# Patient Record
Sex: Female | Born: 1940 | Race: Black or African American | Hispanic: No | Marital: Married | Smoking: Former smoker
Health system: Southern US, Community
[De-identification: ages and names within clinical notes are randomized; demographics above are authoritative.]

## PROBLEM LIST (undated history)

## (undated) DIAGNOSIS — C801 Malignant (primary) neoplasm, unspecified: Secondary | ICD-10-CM

## (undated) DIAGNOSIS — E119 Type 2 diabetes mellitus without complications: Secondary | ICD-10-CM

## (undated) DIAGNOSIS — M869 Osteomyelitis, unspecified: Secondary | ICD-10-CM

## (undated) DIAGNOSIS — I10 Essential (primary) hypertension: Secondary | ICD-10-CM

## (undated) HISTORY — PX: TUBAL LIGATION: SHX77

## (undated) HISTORY — PX: CYSTECTOMY: SUR359

---

## 1999-01-18 ENCOUNTER — Emergency Department (HOSPITAL_COMMUNITY): Admission: EM | Admit: 1999-01-18 | Discharge: 1999-01-18 | Payer: Self-pay | Admitting: Emergency Medicine

## 2001-09-10 ENCOUNTER — Emergency Department (HOSPITAL_COMMUNITY): Admission: EM | Admit: 2001-09-10 | Discharge: 2001-09-10 | Payer: Self-pay | Admitting: Emergency Medicine

## 2004-03-09 ENCOUNTER — Emergency Department (HOSPITAL_COMMUNITY): Admission: EM | Admit: 2004-03-09 | Discharge: 2004-03-09 | Payer: Self-pay | Admitting: Emergency Medicine

## 2004-08-05 ENCOUNTER — Ambulatory Visit (HOSPITAL_COMMUNITY): Admission: RE | Admit: 2004-08-05 | Discharge: 2004-08-05 | Payer: Self-pay | Admitting: Internal Medicine

## 2004-08-28 ENCOUNTER — Ambulatory Visit (HOSPITAL_COMMUNITY): Admission: RE | Admit: 2004-08-28 | Discharge: 2004-08-28 | Payer: Self-pay | Admitting: Internal Medicine

## 2005-02-19 ENCOUNTER — Ambulatory Visit (HOSPITAL_COMMUNITY): Admission: RE | Admit: 2005-02-19 | Discharge: 2005-02-19 | Payer: Self-pay | Admitting: Internal Medicine

## 2006-03-26 ENCOUNTER — Encounter (INDEPENDENT_AMBULATORY_CARE_PROVIDER_SITE_OTHER): Payer: Self-pay | Admitting: Internal Medicine

## 2006-03-26 ENCOUNTER — Ambulatory Visit: Payer: Self-pay | Admitting: Family Medicine

## 2006-03-30 ENCOUNTER — Encounter (INDEPENDENT_AMBULATORY_CARE_PROVIDER_SITE_OTHER): Payer: Self-pay | Admitting: Family Medicine

## 2006-03-30 ENCOUNTER — Encounter (INDEPENDENT_AMBULATORY_CARE_PROVIDER_SITE_OTHER): Payer: Self-pay | Admitting: Internal Medicine

## 2006-03-30 LAB — CONVERTED CEMR LAB
RBC count: 4.4 10*6/uL
TSH: 1.333 microintl units/mL
WBC, blood: 8.7 10*3/uL

## 2006-04-09 ENCOUNTER — Encounter: Payer: Self-pay | Admitting: Family Medicine

## 2006-04-09 DIAGNOSIS — M810 Age-related osteoporosis without current pathological fracture: Secondary | ICD-10-CM | POA: Insufficient documentation

## 2006-04-09 DIAGNOSIS — N318 Other neuromuscular dysfunction of bladder: Secondary | ICD-10-CM | POA: Insufficient documentation

## 2006-04-09 DIAGNOSIS — K5900 Constipation, unspecified: Secondary | ICD-10-CM | POA: Insufficient documentation

## 2006-04-09 DIAGNOSIS — M1990 Unspecified osteoarthritis, unspecified site: Secondary | ICD-10-CM | POA: Insufficient documentation

## 2006-04-09 DIAGNOSIS — E785 Hyperlipidemia, unspecified: Secondary | ICD-10-CM | POA: Insufficient documentation

## 2006-04-09 DIAGNOSIS — I10 Essential (primary) hypertension: Secondary | ICD-10-CM | POA: Insufficient documentation

## 2006-04-17 ENCOUNTER — Ambulatory Visit: Payer: Self-pay | Admitting: Family Medicine

## 2006-04-17 ENCOUNTER — Encounter (INDEPENDENT_AMBULATORY_CARE_PROVIDER_SITE_OTHER): Payer: Self-pay | Admitting: Internal Medicine

## 2006-04-22 ENCOUNTER — Telehealth (INDEPENDENT_AMBULATORY_CARE_PROVIDER_SITE_OTHER): Payer: Self-pay | Admitting: Internal Medicine

## 2006-04-23 ENCOUNTER — Encounter (INDEPENDENT_AMBULATORY_CARE_PROVIDER_SITE_OTHER): Payer: Self-pay | Admitting: Internal Medicine

## 2006-04-30 ENCOUNTER — Ambulatory Visit: Payer: Self-pay | Admitting: Family Medicine

## 2006-04-30 ENCOUNTER — Encounter (INDEPENDENT_AMBULATORY_CARE_PROVIDER_SITE_OTHER): Payer: Self-pay | Admitting: Internal Medicine

## 2006-05-14 ENCOUNTER — Ambulatory Visit: Payer: Self-pay | Admitting: Internal Medicine

## 2006-06-11 ENCOUNTER — Ambulatory Visit: Payer: Self-pay | Admitting: Family Medicine

## 2006-06-11 ENCOUNTER — Encounter (INDEPENDENT_AMBULATORY_CARE_PROVIDER_SITE_OTHER): Payer: Self-pay | Admitting: Internal Medicine

## 2006-06-11 LAB — CONVERTED CEMR LAB
ALT: 15 units/L (ref 0–35)
AST: 14 units/L (ref 0–37)
Albumin: 4.1 g/dL (ref 3.5–5.2)
Alkaline Phosphatase: 86 units/L (ref 39–117)
Bilirubin, Direct: 0.2 mg/dL (ref 0.0–0.3)
Indirect Bilirubin: 0.2 mg/dL (ref 0.0–0.9)
Total Bilirubin: 0.4 mg/dL (ref 0.3–1.2)
Total Protein: 8 g/dL (ref 6.0–8.3)

## 2006-07-23 ENCOUNTER — Ambulatory Visit: Payer: Self-pay | Admitting: Family Medicine

## 2006-07-23 DIAGNOSIS — D126 Benign neoplasm of colon, unspecified: Secondary | ICD-10-CM

## 2006-07-23 DIAGNOSIS — E1165 Type 2 diabetes mellitus with hyperglycemia: Secondary | ICD-10-CM

## 2006-07-23 LAB — CONVERTED CEMR LAB
Cholesterol, target level: 200 mg/dL
Glucose, Bld: 155 mg/dL
HDL goal, serum: 40 mg/dL
Hgb A1c MFr Bld: 12.2 %
LDL Goal: 100 mg/dL

## 2006-07-24 LAB — CONVERTED CEMR LAB
ALT: 20 units/L (ref 0–35)
AST: 20 units/L (ref 0–37)
Albumin: 4 g/dL (ref 3.5–5.2)
Alkaline Phosphatase: 88 units/L (ref 39–117)
BUN: 17 mg/dL (ref 6–23)
CO2: 17 meq/L — ABNORMAL LOW (ref 19–32)
Calcium: 9.4 mg/dL (ref 8.4–10.5)
Chloride: 104 meq/L (ref 96–112)
Cholesterol: 132 mg/dL (ref 0–200)
Creatinine, Ser: 0.98 mg/dL (ref 0.40–1.20)
Glucose, Bld: 142 mg/dL — ABNORMAL HIGH (ref 70–99)
HDL: 53 mg/dL (ref >39)
LDL Cholesterol: 67 mg/dL (ref 0–99)
Potassium: 4.9 meq/L (ref 3.5–5.3)
Sodium: 139 meq/L (ref 135–145)
Total Bilirubin: 0.4 mg/dL (ref 0.3–1.2)
Total CHOL/HDL Ratio: 2.5
Total Protein: 8 g/dL (ref 6.0–8.3)
Triglycerides: 61 mg/dL (ref <150)
VLDL: 12 mg/dL (ref 0–40)

## 2006-08-06 ENCOUNTER — Encounter (INDEPENDENT_AMBULATORY_CARE_PROVIDER_SITE_OTHER): Payer: Self-pay | Admitting: Internal Medicine

## 2006-08-07 ENCOUNTER — Encounter (INDEPENDENT_AMBULATORY_CARE_PROVIDER_SITE_OTHER): Payer: Self-pay | Admitting: Family Medicine

## 2006-08-12 ENCOUNTER — Encounter (INDEPENDENT_AMBULATORY_CARE_PROVIDER_SITE_OTHER): Payer: Self-pay | Admitting: Internal Medicine

## 2006-08-19 ENCOUNTER — Telehealth (INDEPENDENT_AMBULATORY_CARE_PROVIDER_SITE_OTHER): Payer: Self-pay | Admitting: Family Medicine

## 2006-09-04 ENCOUNTER — Ambulatory Visit: Payer: Self-pay | Admitting: Family Medicine

## 2006-09-04 LAB — CONVERTED CEMR LAB: LDL Goal: 70 mg/dL

## 2006-09-18 ENCOUNTER — Ambulatory Visit: Payer: Self-pay | Admitting: Family Medicine

## 2006-09-18 DIAGNOSIS — J069 Acute upper respiratory infection, unspecified: Secondary | ICD-10-CM | POA: Insufficient documentation

## 2006-09-25 ENCOUNTER — Encounter (INDEPENDENT_AMBULATORY_CARE_PROVIDER_SITE_OTHER): Payer: Self-pay | Admitting: Internal Medicine

## 2006-09-28 ENCOUNTER — Ambulatory Visit (HOSPITAL_COMMUNITY): Admission: RE | Admit: 2006-09-28 | Discharge: 2006-09-28 | Payer: Self-pay | Admitting: Internal Medicine

## 2006-09-28 ENCOUNTER — Ambulatory Visit: Payer: Self-pay | Admitting: Internal Medicine

## 2006-09-28 DIAGNOSIS — R059 Cough, unspecified: Secondary | ICD-10-CM | POA: Insufficient documentation

## 2006-09-28 DIAGNOSIS — R05 Cough: Secondary | ICD-10-CM

## 2006-09-28 LAB — CONVERTED CEMR LAB: Blood Glucose, Fingerstick: 151

## 2006-10-02 ENCOUNTER — Telehealth (INDEPENDENT_AMBULATORY_CARE_PROVIDER_SITE_OTHER): Payer: Self-pay | Admitting: Family Medicine

## 2006-10-29 ENCOUNTER — Ambulatory Visit: Payer: Self-pay | Admitting: Internal Medicine

## 2006-10-29 DIAGNOSIS — R1013 Epigastric pain: Secondary | ICD-10-CM

## 2006-10-29 DIAGNOSIS — K3189 Other diseases of stomach and duodenum: Secondary | ICD-10-CM

## 2006-10-29 LAB — CONVERTED CEMR LAB: Hgb A1c MFr Bld: 11.4 %

## 2006-10-30 ENCOUNTER — Encounter (INDEPENDENT_AMBULATORY_CARE_PROVIDER_SITE_OTHER): Payer: Self-pay | Admitting: Internal Medicine

## 2006-11-20 ENCOUNTER — Encounter (INDEPENDENT_AMBULATORY_CARE_PROVIDER_SITE_OTHER): Payer: Self-pay | Admitting: Internal Medicine

## 2006-12-09 ENCOUNTER — Ambulatory Visit (HOSPITAL_COMMUNITY): Admission: RE | Admit: 2006-12-09 | Discharge: 2006-12-09 | Payer: Self-pay | Admitting: Internal Medicine

## 2007-06-02 ENCOUNTER — Ambulatory Visit (HOSPITAL_COMMUNITY): Admission: RE | Admit: 2007-06-02 | Discharge: 2007-06-02 | Payer: Self-pay | Admitting: *Deleted

## 2007-08-06 ENCOUNTER — Encounter (INDEPENDENT_AMBULATORY_CARE_PROVIDER_SITE_OTHER): Payer: Self-pay | Admitting: Internal Medicine

## 2007-12-20 ENCOUNTER — Encounter (INDEPENDENT_AMBULATORY_CARE_PROVIDER_SITE_OTHER): Payer: Self-pay | Admitting: Internal Medicine

## 2007-12-24 ENCOUNTER — Encounter (INDEPENDENT_AMBULATORY_CARE_PROVIDER_SITE_OTHER): Payer: Self-pay | Admitting: Internal Medicine

## 2009-10-23 ENCOUNTER — Ambulatory Visit (HOSPITAL_COMMUNITY): Admission: RE | Admit: 2009-10-23 | Discharge: 2009-10-23 | Payer: Self-pay | Admitting: Internal Medicine

## 2010-03-31 ENCOUNTER — Emergency Department (HOSPITAL_COMMUNITY)
Admission: EM | Admit: 2010-03-31 | Discharge: 2010-04-01 | Payer: Self-pay | Source: Home / Self Care | Admitting: Emergency Medicine

## 2010-04-01 ENCOUNTER — Ambulatory Visit (HOSPITAL_COMMUNITY): Admission: RE | Admit: 2010-04-01 | Discharge: 2010-04-01 | Payer: Self-pay | Admitting: Emergency Medicine

## 2010-06-09 LAB — CONVERTED CEMR LAB
Microalb Creat Ratio: 39.8 mg/g
Microalb, Ur: 4.04 mg/dL

## 2010-09-27 NOTE — H&P (Signed)
NAMEARABELLA, [name]             ACCOUNT NO.:  [id number]   MEDICAL RECORD NO.:  [id number]          PATIENT TYPE:  INP   LOCATION:  5009                         FACILITY:  [facility]   PHYSICIAN:  Lianne Cure, P.A.  DATE OF BIRTH:  02/14/1929   DATE OF ADMISSION:  01/26/2007  DATE OF DISCHARGE:  01/30/2007                              HISTORY & PHYSICAL   HISTORY:  She is a 70 year old female that comes in with minimal back  pain but left leg radicular pain down the lateral aspect into her foot.   ALLERGIES:  NO KNOWN DRUG ALLERGIES.   MEDICATIONS:  1. Benicar 20/12.5 mg once daily.  2. Baby aspirin 81 mg once daily.   PAST SURGICAL HISTORY:  1. Cholecystectomy.  2. Rotator cuff.  3. Left carpal tunnel.   FAMILY HISTORY:  1. Coronary artery disease.  2. Cancer.  3. Stroke.   REVIEW OF SYSTEMS:  She reports no fever, no chills, no night sweats, no  nausea, vomiting, diarrhea.  No hemoptysis, no bowel or bladder  incontinence.   PHYSICAL EXAMINATION:  VITAL SIGNS:  Temperature 98.7, pulse 70,  respirations 20, blood pressure 170/84.  GENERAL:  She appears well-developed, well-nourished.  HEENT:  Pupils equal, round and reactive to light.  NECK:  Supple.  Full range of motion.  CHEST:  Clear to auscultation bilaterally.  HEART:  Regular rate and rhythm.  No murmur.  ABDOMEN:  Soft.  Nontender to palpation.  EXTREMITIES:  Bilateral upper extremities have full range of motion, 3+  to 4 on quadriceps on the left.  Strength 5/5 on the right.  Sensation  is intact and equal in bilateral lower extremities.  SKIN:  Clean, dry and intact.   RADIOLOGY DATA:  MRI and x-rays were reviewed.   DIAGNOSIS:  Lumbar stenosis/herniated nucleus pulposus L3-4 and L4-5 .   PLAN:  Lumbar laminectomy L3-4 and L4-5 by Dr. Nelda Severe.      Lianne Cure, P.A.     MC/MEDQ  D:  01/14/2007  T:  01/15/2007  Job:  [id number]

## 2010-09-27 NOTE — H&P (Signed)
[name], [name]             ACCOUNT NO.:  [id number]   MEDICAL RECORD NO.:  [id number]          PATIENT TYPE:  OUT   LOCATION:  RAD                           FACILITY:  [facility]   PHYSICIAN:  Lianne Cure, P.A.  DATE OF BIRTH:  02/14/1929   DATE OF ADMISSION:  12/09/2006  DATE OF DISCHARGE:  12/09/2006                              HISTORY & PHYSICAL   HISTORY:  She is a 70 year old female that comes in with minimal back  pain but left leg radicular pain down the lateral aspect into her foot.   ALLERGIES:  NO KNOWN DRUG ALLERGIES.   MEDICATIONS:  1. Benicar 20/12.5 mg once daily.  2. Baby aspirin 81 mg once daily.   PAST SURGICAL HISTORY:  1. Cholecystectomy.  2. Rotator cuff.  3. Left carpal tunnel.   FAMILY HISTORY:  1. Coronary artery disease.  2. Cancer.  3. Stroke.   REVIEW OF SYSTEMS:  She reports no fever, no chills, no night sweats, no  nausea, vomiting, diarrhea.  No hemoptysis, no bowel or bladder  incontinence.   PHYSICAL EXAMINATION:  VITAL SIGNS:  Temperature 98.7, pulse 70,  respirations 20, blood pressure 170/84.  GENERAL:  She appears well-developed, well-nourished.  HEENT:  Pupils equal, round and reactive to light.  NECK:  Supple.  Full range of motion.  CHEST:  Clear to auscultation bilaterally.  HEART:  Regular rate and rhythm.  No murmur.  ABDOMEN:  Soft.  Nontender to palpation.  EXTREMITIES:  Bilateral upper extremities have full range of motion, 3+  to 4 on quadriceps on the left.  Strength 5/5 on the right.  Sensation  is intact and equal in bilateral lower extremities.  SKIN:  Clean, dry and intact.   RADIOLOGY DATA:  MRI and x-rays were reviewed.   DIAGNOSIS:  Lumbar stenosis/herniated nucleus pulposus L3-4 and L4-5 .   PLAN:  Lumbar laminectomy L3-4 and L4-5 by Dr. Nelda Severe.      Lianne Cure, P.A.     MC/MEDQ  D:  01/14/2007  T:  01/15/2007  Job:  [id number]

## 2010-10-12 ENCOUNTER — Emergency Department (HOSPITAL_COMMUNITY)
Admission: EM | Admit: 2010-10-12 | Discharge: 2010-10-12 | Disposition: A | Discharge status: Home / Self Care | Payer: Medicare Other | Attending: Emergency Medicine | Admitting: Emergency Medicine

## 2010-10-12 ENCOUNTER — Emergency Department (HOSPITAL_COMMUNITY): Payer: Medicare Other

## 2010-10-12 DIAGNOSIS — I10 Essential (primary) hypertension: Secondary | ICD-10-CM | POA: Insufficient documentation

## 2010-10-12 DIAGNOSIS — Z79899 Other long term (current) drug therapy: Secondary | ICD-10-CM | POA: Insufficient documentation

## 2010-10-12 DIAGNOSIS — IMO0001 Reserved for inherently not codable concepts without codable children: Secondary | ICD-10-CM | POA: Insufficient documentation

## 2010-10-12 DIAGNOSIS — R05 Cough: Secondary | ICD-10-CM | POA: Insufficient documentation

## 2010-10-12 DIAGNOSIS — R059 Cough, unspecified: Secondary | ICD-10-CM | POA: Insufficient documentation

## 2010-10-12 DIAGNOSIS — E119 Type 2 diabetes mellitus without complications: Secondary | ICD-10-CM | POA: Insufficient documentation

## 2011-07-08 ENCOUNTER — Other Ambulatory Visit (HOSPITAL_COMMUNITY): Payer: Self-pay | Admitting: Internal Medicine

## 2011-07-08 DIAGNOSIS — Z139 Encounter for screening, unspecified: Secondary | ICD-10-CM

## 2011-07-10 ENCOUNTER — Ambulatory Visit (HOSPITAL_COMMUNITY)
Admission: RE | Admit: 2011-07-10 | Discharge: 2011-07-10 | Disposition: A | Discharge status: Home / Self Care | Payer: Medicare Other | Source: Ambulatory Visit | Attending: Internal Medicine | Admitting: Internal Medicine

## 2011-07-10 DIAGNOSIS — Z139 Encounter for screening, unspecified: Secondary | ICD-10-CM

## 2011-07-10 DIAGNOSIS — Z1231 Encounter for screening mammogram for malignant neoplasm of breast: Secondary | ICD-10-CM | POA: Insufficient documentation

## 2012-08-11 ENCOUNTER — Other Ambulatory Visit (HOSPITAL_COMMUNITY): Payer: Self-pay | Admitting: Internal Medicine

## 2012-08-11 DIAGNOSIS — Z139 Encounter for screening, unspecified: Secondary | ICD-10-CM

## 2012-08-13 ENCOUNTER — Ambulatory Visit (HOSPITAL_COMMUNITY)
Admission: RE | Admit: 2012-08-13 | Discharge: 2012-08-13 | Disposition: A | Discharge status: Home / Self Care | Payer: Medicare Other | Source: Ambulatory Visit | Attending: Internal Medicine | Admitting: Internal Medicine

## 2012-08-13 DIAGNOSIS — Z1231 Encounter for screening mammogram for malignant neoplasm of breast: Secondary | ICD-10-CM | POA: Insufficient documentation

## 2012-08-13 DIAGNOSIS — Z139 Encounter for screening, unspecified: Secondary | ICD-10-CM

## 2012-12-09 ENCOUNTER — Emergency Department (HOSPITAL_COMMUNITY): Payer: Medicare Other

## 2012-12-09 ENCOUNTER — Emergency Department (HOSPITAL_COMMUNITY)
Admission: EM | Admit: 2012-12-09 | Discharge: 2012-12-10 | Disposition: A | Discharge status: Home / Self Care | Payer: Medicare Other | Attending: Emergency Medicine | Admitting: Emergency Medicine

## 2012-12-09 ENCOUNTER — Encounter (HOSPITAL_COMMUNITY): Payer: Self-pay | Admitting: Emergency Medicine

## 2012-12-09 DIAGNOSIS — Z87891 Personal history of nicotine dependence: Secondary | ICD-10-CM | POA: Insufficient documentation

## 2012-12-09 DIAGNOSIS — Z9851 Tubal ligation status: Secondary | ICD-10-CM | POA: Insufficient documentation

## 2012-12-09 DIAGNOSIS — I10 Essential (primary) hypertension: Secondary | ICD-10-CM | POA: Insufficient documentation

## 2012-12-09 DIAGNOSIS — R5381 Other malaise: Secondary | ICD-10-CM | POA: Insufficient documentation

## 2012-12-09 DIAGNOSIS — Z79899 Other long term (current) drug therapy: Secondary | ICD-10-CM | POA: Insufficient documentation

## 2012-12-09 DIAGNOSIS — R109 Unspecified abdominal pain: Secondary | ICD-10-CM | POA: Insufficient documentation

## 2012-12-09 DIAGNOSIS — Z880 Allergy status to penicillin: Secondary | ICD-10-CM | POA: Insufficient documentation

## 2012-12-09 DIAGNOSIS — Z7982 Long term (current) use of aspirin: Secondary | ICD-10-CM | POA: Insufficient documentation

## 2012-12-09 DIAGNOSIS — N12 Tubulo-interstitial nephritis, not specified as acute or chronic: Secondary | ICD-10-CM | POA: Insufficient documentation

## 2012-12-09 DIAGNOSIS — E119 Type 2 diabetes mellitus without complications: Secondary | ICD-10-CM | POA: Insufficient documentation

## 2012-12-09 HISTORY — DX: Type 2 diabetes mellitus without complications: E11.9

## 2012-12-09 HISTORY — DX: Essential (primary) hypertension: I10

## 2012-12-09 LAB — CBC WITH DIFFERENTIAL/PLATELET
Eosinophils Absolute: 0.4 10*3/uL (ref 0.0–0.7)
Eosinophils Relative: 3 % (ref 0–5)
Hemoglobin: 10.2 g/dL — ABNORMAL LOW (ref 12.0–15.0)
Lymphs Abs: 4.5 10*3/uL — ABNORMAL HIGH (ref 0.7–4.0)
MCH: 26.6 pg (ref 26.0–34.0)
MCV: 80.7 fL (ref 78.0–100.0)
Monocytes Relative: 10 % (ref 3–12)
RBC: 3.84 MIL/uL — ABNORMAL LOW (ref 3.87–5.11)

## 2012-12-09 LAB — URINALYSIS, ROUTINE W REFLEX MICROSCOPIC
Bilirubin Urine: NEGATIVE
Glucose, UA: >1000 mg/dL — AB
Ketones, ur: NEGATIVE mg/dL
Nitrite: NEGATIVE
Protein, ur: 100 mg/dL — AB
Specific Gravity, Urine: 1.026 (ref 1.005–1.030)
Urobilinogen, UA: 0.2 mg/dL (ref 0.0–1.0)
pH: 7.5 (ref 5.0–8.0)

## 2012-12-09 LAB — BASIC METABOLIC PANEL
BUN: 19 mg/dL (ref 6–23)
Calcium: 9.1 mg/dL (ref 8.4–10.5)
GFR calc non Af Amer: 41 mL/min — ABNORMAL LOW (ref >90)
Glucose, Bld: 230 mg/dL — ABNORMAL HIGH (ref 70–99)
Potassium: 4.3 mEq/L (ref 3.5–5.1)

## 2012-12-09 LAB — URINE MICROSCOPIC-ADD ON

## 2012-12-09 MED ORDER — MORPHINE SULFATE 4 MG/ML IJ SOLN
4.0000 mg | Freq: Once | INTRAMUSCULAR | Status: AC
  Administered 2012-12-09: 4 mg via INTRAVENOUS
  Filled 2012-12-09: qty 1

## 2012-12-09 MED ORDER — LEVOFLOXACIN 500 MG PO TABS
750.0000 mg | ORAL_TABLET | Freq: Once | ORAL | Status: AC
  Administered 2012-12-09: 750 mg via ORAL
  Filled 2012-12-09: qty 2

## 2012-12-09 MED ORDER — LEVOFLOXACIN 750 MG PO TABS: 750.0000 mg | ORAL_TABLET | Freq: Every day | ORAL | Status: DC

## 2012-12-09 MED ORDER — SODIUM CHLORIDE 0.9 % IV BOLUS (SEPSIS)
500.0000 mL | Freq: Once | INTRAVENOUS | Status: AC
  Administered 2012-12-09: 1000 mL via INTRAVENOUS

## 2012-12-09 NOTE — ED Notes (Signed)
Iv team to start iv unsuccessful x2

## 2012-12-09 NOTE — ED Provider Notes (Signed)
CSN: [id number]     Arrival date & time 12/09/12  1942 History     First MD Initiated Contact with Patient 12/09/12 1957     Chief Complaint  Patient presents with  . Flank Pain   (Consider location/radiation/quality/duration/timing/severity/associated sxs/prior Treatment) HPI Comments: 72 yo female with DM, HTN hx presents with recurrent left flank pain, gradually worsening for 5 days.  No hx of similar. Nothing improves. Sharp/ ache.  No AAA hx known.  No fevers.  No dysuria.  No recent surgeries.  Not worse with eating.    The history is provided by the patient.    Past Medical History  Diagnosis Date  . Diabetes mellitus without complication   . Hypertension    Past Surgical History  Procedure Laterality Date  . Tubal ligation     Family History  Problem Relation Age of Onset  . Cancer Mother   . Diabetes Father    History  Substance Use Topics  . Smoking status: Former Games developer  . Smokeless tobacco: Not on file  . Alcohol Use: No   OB History   Grav Para Term Preterm Abortions TAB SAB Ect Mult Living                 Review of Systems  Constitutional: Positive for fatigue. Negative for fever and chills.  HENT: Negative for neck pain and neck stiffness.   Eyes: Negative for visual disturbance.  Respiratory: Negative for shortness of breath.   Cardiovascular: Negative for chest pain.  Gastrointestinal: Positive for abdominal pain. Negative for vomiting.  Genitourinary: Positive for flank pain. Negative for dysuria.  Musculoskeletal: Negative for back pain.  Skin: Negative for rash.  Neurological: Negative for light-headedness and headaches.    Allergies  Metformin; Penicillins; Pioglitazone; and Rosiglitazone maleate  Home Medications   Current Outpatient Rx  Name  Route  Sig  Dispense  Refill  . alendronate (FOSAMAX) 70 MG tablet   Oral   Take 70 mg by mouth every 7 (seven) days. Take with a full glass of water on an empty stomach.         .  benazepril (LOTENSIN) 40 MG tablet   Oral   Take 40 mg by mouth daily.         . cholecalciferol (VITAMIN D) 1000 UNITS tablet   Oral   Take 1,000 Units by mouth 2 (two) times a week.         . diltiazem (DILACOR XR) 180 MG 24 hr capsule   Oral   Take 180 mg by mouth daily.         . fish oil-omega-3 fatty acids 1000 MG capsule   Oral   Take 2 g by mouth daily.         . hydrochlorothiazide (HYDRODIURIL) 25 MG tablet   Oral   Take 25 mg by mouth daily.         . insulin lispro protamine-lispro (HUMALOG 75/25) (75-25) 100 UNIT/ML SUSP   Subcutaneous   Inject 15-70 Units into the skin.         . Multiple Vitamin (MULTIVITAMIN WITH MINERALS) TABS   Oral   Take 1 tablet by mouth daily.         . naproxen sodium (ANAPROX) 220 MG tablet   Oral   Take 220 mg by mouth 2 (two) times daily with a meal.         . rosuvastatin (CRESTOR) 10 MG tablet   Oral   Take  10 mg by mouth daily.         . simvastatin (ZOCOR) 20 MG tablet   Oral   Take 20 mg by mouth every evening.          BP 178/84  Pulse 93  Temp(Src) 99 F (37.2 C) (Oral)  Resp 16  SpO2 100% Physical Exam  Nursing note and vitals reviewed. Constitutional: She is oriented to person, place, and time. She appears well-developed and well-nourished.  HENT:  Head: Normocephalic and atraumatic.  Eyes: Conjunctivae are normal. Right eye exhibits no discharge. Left eye exhibits no discharge.  Neck: Normal range of motion. Neck supple. No tracheal deviation present.  Cardiovascular: Normal rate and regular rhythm.   Pulmonary/Chest: Effort normal and breath sounds normal.  Abdominal: Soft. She exhibits no distension. There is tenderness (left upper). There is no guarding.  Musculoskeletal: She exhibits tenderness (left flank). She exhibits no edema.  Neurological: She is alert and oriented to person, place, and time.  Skin: Skin is warm. No rash noted.  Psychiatric: She has a normal mood and affect.     ED Course   Procedures (including critical care time) EMERGENCY DEPARTMENT ULTRASOUND  Study: Limited Retroperitoneal/ Abdominal Ultrasound of the Abdominal Aorta.  INDICATIONS:Abdominal pain, Back pain and Age>55 Indication: Multiple views of the abdominal aorta are obtained from the diaphragmatic hiatus to the aortic bifurcation in transverse planes with a multi- Frequency probe.  PERFORMED BY: Myself  IMAGES ARCHIVED?: Yes  FINDINGS: Maximum aortic dimensions are 2.2 cm  LIMITATIONS:  Bowel gas  INTERPRETATION:  No abdominal aortic aneurysm  Emergency Focused Ultrasound Exam: Limited abdomen of kidneys and bladder Indication: flank pain Focused abdominal ultrasound with kidneys imaged in transverse and longitudinal planes with bladder visualized in transverse plane. Interpretation: no hydronephrosis visualized.  no stones visualized  Images stored on the machine.    Labs Reviewed  URINALYSIS, ROUTINE W REFLEX MICROSCOPIC - Abnormal; Notable for the following:    APPearance CLOUDY (*)    Glucose, UA >1000 (*)    Hgb urine dipstick SMALL (*)    Protein, ur 100 (*)    Leukocytes, UA SMALL (*)    All other components within normal limits  CBC WITH DIFFERENTIAL - Abnormal; Notable for the following:    WBC 12.2 (*)    RBC 3.84 (*)    Hemoglobin 10.2 (*)    HCT 31.0 (*)    Lymphs Abs 4.5 (*)    Monocytes Absolute 1.2 (*)    All other components within normal limits  BASIC METABOLIC PANEL - Abnormal; Notable for the following:    Glucose, Bld 230 (*)    Creatinine, Ser 1.29 (*)    GFR calc non Af Amer 41 (*)    GFR calc Af Amer 47 (*)    All other components within normal limits  URINE MICROSCOPIC-ADD ON - Abnormal; Notable for the following:    Squamous Epithelial / LPF FEW (*)    Bacteria, UA MANY (*)    All other components within normal limits  URINE CULTURE   No results found. No diagnosis found.  MDM  With age and acute flank pain [place] limited done to  rule out AAA and significant hydronephrosis. Pain improved in ED. Fluids given.  Abx dose given once UA returned.  Pyelonephritis.  Ct Abdomen Pelvis Wo Contrast  12/09/2012   *RADIOLOGY REPORT*  Clinical Data: Left-sided flank pain.  Pain radiating around the back.  Left upper quadrant pain.  CT ABDOMEN  AND PELVIS WITHOUT CONTRAST  Technique:  Multidetector CT imaging of the abdomen and pelvis was performed following the standard protocol without intravenous contrast.  Comparison: None.  Findings: Lung Bases: Clear.  Liver:  Normal. Unenhanced CT was performed per clinician order. Lack of IV contrast limits sensitivity and specificity, especially for evaluation of abdominal/pelvic solid viscera.  Spleen:  Normal.  Gallbladder:  Cholelithiasis is present.  No noncontrast inflammatory changes of the gallbladder on this noncontrast CT.  Common bile duct:  Normal.  Pancreas:  Normal.  Adrenal glands:  Normal bilaterally.  Kidneys:  No stones or hydronephrosis.  The ureters appear within normal limits.  Stomach:  Opacified with oral contrast.  No mural thickening.  No inflammatory changes.  Small bowel:  Normal duodenum.  No small bowel obstruction.  No mesenteric adenopathy or free air.  Colon:   Appendix not definitively identified.  Large proximal stool burden.  Redundant transverse colon.  Sigmoid is decompressed with diverticulosis.  No diverticulitis.  Pelvic Genitourinary:  Urinary bladder appears normal.  Calcified fibroid in the right posterior uterus.  Adnexal structures appear normal.  No free fluid.  No adenopathy.  Bones:  Bilateral hip osteoarthritis.  No aggressive osseous lesions.  Vasculature: Atherosclerosis without acute abnormality.  No aneurysm.  Body Wall: Fat containing periumbilical hernia.  IMPRESSION:  1.  No acute abnormality. 2.  Cholelithiasis. 3.  Atherosclerosis. 4.  Fibroid uterus.   Original Report Authenticated By: Andreas Newport, M.D.   Abx dose in ED.  Close fup outpt.  Well  appearing in ED. Family with patient.   Enid Skeens, MD 12/09/12 [phone]

## 2012-12-09 NOTE — ED Notes (Signed)
IV team bedside. 

## 2012-12-09 NOTE — ED Notes (Signed)
Pt states she had a pain that started in the left side of her back and radiates around to the front and is now hurting in her left upper quadrant  Pt states the pain started one day last week and she has just been putting off coming in   Pt states she is diabetic so she thought she would come in for evaluation   Pt denies N/V/D

## 2012-12-11 LAB — URINE CULTURE

## 2013-01-06 ENCOUNTER — Other Ambulatory Visit (HOSPITAL_COMMUNITY): Payer: Self-pay | Admitting: Internal Medicine

## 2013-01-06 DIAGNOSIS — N63 Unspecified lump in unspecified breast: Secondary | ICD-10-CM

## 2013-01-12 ENCOUNTER — Ambulatory Visit (HOSPITAL_COMMUNITY)
Admission: RE | Admit: 2013-01-12 | Discharge: 2013-01-12 | Disposition: A | Discharge status: Home / Self Care | Payer: Medicare Other | Source: Ambulatory Visit | Attending: Internal Medicine | Admitting: Internal Medicine

## 2013-01-12 DIAGNOSIS — N644 Mastodynia: Secondary | ICD-10-CM | POA: Insufficient documentation

## 2013-01-12 DIAGNOSIS — N63 Unspecified lump in unspecified breast: Secondary | ICD-10-CM | POA: Insufficient documentation

## 2013-03-15 ENCOUNTER — Encounter (HOSPITAL_COMMUNITY): Payer: Self-pay | Admitting: Emergency Medicine

## 2013-03-15 ENCOUNTER — Emergency Department (HOSPITAL_COMMUNITY): Payer: Medicare Other

## 2013-03-15 ENCOUNTER — Emergency Department (HOSPITAL_COMMUNITY)
Admission: EM | Admit: 2013-03-15 | Discharge: 2013-03-15 | Disposition: A | Discharge status: Home / Self Care | Payer: Medicare Other | Attending: Emergency Medicine | Admitting: Emergency Medicine

## 2013-03-15 DIAGNOSIS — R071 Chest pain on breathing: Secondary | ICD-10-CM | POA: Insufficient documentation

## 2013-03-15 DIAGNOSIS — M869 Osteomyelitis, unspecified: Secondary | ICD-10-CM | POA: Insufficient documentation

## 2013-03-15 DIAGNOSIS — I10 Essential (primary) hypertension: Secondary | ICD-10-CM | POA: Insufficient documentation

## 2013-03-15 DIAGNOSIS — E119 Type 2 diabetes mellitus without complications: Secondary | ICD-10-CM | POA: Insufficient documentation

## 2013-03-15 DIAGNOSIS — Z794 Long term (current) use of insulin: Secondary | ICD-10-CM | POA: Insufficient documentation

## 2013-03-15 DIAGNOSIS — Z880 Allergy status to penicillin: Secondary | ICD-10-CM | POA: Insufficient documentation

## 2013-03-15 DIAGNOSIS — Z791 Long term (current) use of non-steroidal anti-inflammatories (NSAID): Secondary | ICD-10-CM | POA: Insufficient documentation

## 2013-03-15 DIAGNOSIS — Z7983 Long term (current) use of bisphosphonates: Secondary | ICD-10-CM | POA: Insufficient documentation

## 2013-03-15 DIAGNOSIS — Z792 Long term (current) use of antibiotics: Secondary | ICD-10-CM | POA: Insufficient documentation

## 2013-03-15 DIAGNOSIS — R1012 Left upper quadrant pain: Secondary | ICD-10-CM | POA: Insufficient documentation

## 2013-03-15 DIAGNOSIS — Z79899 Other long term (current) drug therapy: Secondary | ICD-10-CM | POA: Insufficient documentation

## 2013-03-15 DIAGNOSIS — Z87891 Personal history of nicotine dependence: Secondary | ICD-10-CM | POA: Insufficient documentation

## 2013-03-15 DIAGNOSIS — Z87440 Personal history of urinary (tract) infections: Secondary | ICD-10-CM | POA: Insufficient documentation

## 2013-03-15 DIAGNOSIS — R0789 Other chest pain: Secondary | ICD-10-CM

## 2013-03-15 HISTORY — DX: Osteomyelitis, unspecified: M86.9

## 2013-03-15 MED ORDER — TRAMADOL HCL 50 MG PO TABS
50.0000 mg | ORAL_TABLET | Freq: Once | ORAL | Status: AC
  Administered 2013-03-15: 50 mg via ORAL
  Filled 2013-03-15: qty 1

## 2013-03-15 MED ORDER — TRAMADOL HCL 50 MG PO TABS: 50.0000 mg | ORAL_TABLET | Freq: Four times a day (QID) | ORAL | Status: DC | PRN

## 2013-03-15 NOTE — ED Notes (Signed)
Pt states LUQ pain x 2 months. States she was seen for same pain at [facility] August 31 and was treated for UTI. Pain has continued

## 2013-03-15 NOTE — ED Notes (Signed)
Pt alert & oriented x4, stable gait. Patient given discharge instructions, paperwork & prescription(s). Patient  instructed to stop at the registration desk to finish any additional paperwork. Patient verbalized understanding. Pt left department w/ no further questions. 

## 2013-03-15 NOTE — ED Notes (Signed)
Pt states upper left quadrant pain for the past 2 months, seen by PCP mammogram done (negative per pt). Denies any injury.

## 2013-03-15 NOTE — ED Provider Notes (Signed)
CSN: [id number]     Arrival date & time 03/15/13  1753 History   First MD Initiated Contact with Patient 03/15/13 1811     Chief Complaint  Patient presents with  . Abdominal Pain   (Consider location/radiation/quality/duration/timing/severity/associated sxs/prior Treatment) HPI Comments: Patient presents to the ER for evaluation of pain in the left side of her upper abdomen/lower chest area. She says the symptoms have been there for 2 months. She was seen at [facility] long emergency department on August 31. She says she had tests done and was treated for urinary tract infection, but symptoms haven't improved. Patient reports a sharp pain that is present continuously, worsens with certain positions and movement. She denies falls or injury. There is no fever, nausea, vomiting, diarrhea.  Patient is a 72 y.o. female presenting with abdominal pain.  Abdominal Pain   Past Medical History  Diagnosis Date  . Diabetes mellitus without complication   . Hypertension   . Rheumatoid osteoperiostitis    Past Surgical History  Procedure Laterality Date  . Tubal ligation     Family History  Problem Relation Age of Onset  . Cancer Mother   . Diabetes Father    History  Substance Use Topics  . Smoking status: Former Games developer  . Smokeless tobacco: Not on file  . Alcohol Use: No   OB History   Grav Para Term Preterm Abortions TAB SAB Ect Mult Living                 Review of Systems  Gastrointestinal: Positive for abdominal pain.  All other systems reviewed and are negative.    Allergies  Metformin; Penicillins; Pioglitazone; and Rosiglitazone maleate  Home Medications   Current Outpatient Rx  Name  Route  Sig  Dispense  Refill  . alendronate (FOSAMAX) 70 MG tablet   Oral   Take 70 mg by mouth every 7 (seven) days. Take with a full glass of water on an empty stomach.         . benazepril (LOTENSIN) 40 MG tablet   Oral   Take 40 mg by mouth daily.         . cholecalciferol  (VITAMIN D) 1000 UNITS tablet   Oral   Take 1,000 Units by mouth 2 (two) times a week.         . diltiazem (DILACOR XR) 180 MG 24 hr capsule   Oral   Take 180 mg by mouth daily.         . fish oil-omega-3 fatty acids 1000 MG capsule   Oral   Take 2 g by mouth daily.         . hydrochlorothiazide (HYDRODIURIL) 25 MG tablet   Oral   Take 25 mg by mouth daily.         . insulin lispro protamine-lispro (HUMALOG 75/25) (75-25) 100 UNIT/ML SUSP   Subcutaneous   Inject 15-70 Units into the skin.         Marland Kitchen levofloxacin (LEVAQUIN) 750 MG tablet   Oral   Take 1 tablet (750 mg total) by mouth daily.   4 tablet   0   . Multiple Vitamin (MULTIVITAMIN WITH MINERALS) TABS   Oral   Take 1 tablet by mouth daily.         . naproxen sodium (ANAPROX) 220 MG tablet   Oral   Take 220 mg by mouth 2 (two) times daily with a meal.         . rosuvastatin (  CRESTOR) 10 MG tablet   Oral   Take 10 mg by mouth daily.         . simvastatin (ZOCOR) 20 MG tablet   Oral   Take 20 mg by mouth every evening.          BP 180/74  Pulse 81  Temp(Src) 98.2 F (36.8 C) (Oral)  Resp 16  Ht 5\' 8"  (1.727 m)  Wt 194 lb (87.998 kg)  BMI 29.50 kg/m2  SpO2 99% Physical Exam  Constitutional: She is oriented to person, place, and time. She appears well-developed and well-nourished. No distress.  HENT:  Head: Normocephalic and atraumatic.  Right Ear: Hearing normal.  Left Ear: Hearing normal.  Nose: Nose normal.  Mouth/Throat: Oropharynx is clear and moist and mucous membranes are normal.  Eyes: Conjunctivae and EOM are normal. Pupils are equal, round, and reactive to light.  Neck: Normal range of motion. Neck supple.  Cardiovascular: Regular rhythm, S1 normal and S2 normal.  Exam reveals no gallop and no friction rub.   No murmur heard. Pulmonary/Chest: Effort normal and breath sounds normal. No respiratory distress. She exhibits tenderness.    Abdominal: Soft. Normal appearance and  bowel sounds are normal. There is no hepatosplenomegaly. There is no tenderness. There is no rebound, no guarding, no tenderness at McBurney's point and negative Murphy's sign. No hernia.  Musculoskeletal: Normal range of motion.  Neurological: She is alert and oriented to person, place, and time. She has normal strength. No cranial nerve deficit or sensory deficit. Coordination normal. GCS eye subscore is 4. GCS verbal subscore is 5. GCS motor subscore is 6.  Skin: Skin is warm, dry and intact. No rash noted. No cyanosis.  Psychiatric: She has a normal mood and affect. Her speech is normal and behavior is normal. Thought content normal.    ED Course  Procedures (including critical care time) Labs Review Labs Reviewed - No data to display Imaging Review Dg Ribs Unilateral W/chest Left  03/15/2013   CLINICAL DATA:  Pain  EXAM: LEFT RIBS AND CHEST - 3+ VIEW  COMPARISON:  Chest radiograph October 12, 2010  FINDINGS: Frontal chest as well as bilateral oblique and cone-down lower rib images were obtained. Lungs are clear. Heart size and pulmonary vascularity are normal. No adenopathy.  There is no pneumothorax or effusion. There is no demonstrable rib fracture.  IMPRESSION: The lungs are clear. No demonstrable rib fracture.   Electronically Signed   By: Bretta Bang M.D.   On: 03/15/2013 19:12    EKG Interpretation   None       MDM  Diagnosis: Chest wall pain  Patient presents to the ER for evaluation of pain in the upper abdomen area on the left side. Examination, however, reveals that she points to the lower anterior ribs where her pain is. This area is very tender to the touch. There is no crepitance present. Deep palpation under the ribs in the left upper quadrant and left lower quadrant as well as the rest of the abdomen is nontender and benign. Reviewing her records from previous visit with on refill she had ultrasound and CAT scan performed, no acute abnormalities were seen. This is  likely inflammatory and chest wall in nature. No further testing necessary. There is nothing to indicate acute coronary syndrome, as the patient has had pain for 2 months and is very reproducible with palpation on the ribs. X-ray of the lungs was negative. Rib x-ray likewise showed no acute pathology.  Gilda Crease, MD 03/15/13 [phone]

## 2013-06-08 ENCOUNTER — Ambulatory Visit: Payer: Medicare Other | Admitting: *Deleted

## 2013-12-03 ENCOUNTER — Emergency Department (HOSPITAL_COMMUNITY)
Admission: EM | Admit: 2013-12-03 | Discharge: 2013-12-04 | Disposition: A | Discharge status: Home / Self Care | Payer: Medicare Other | Attending: Emergency Medicine | Admitting: Emergency Medicine

## 2013-12-03 ENCOUNTER — Encounter (HOSPITAL_COMMUNITY): Payer: Self-pay | Admitting: Emergency Medicine

## 2013-12-03 DIAGNOSIS — Z87891 Personal history of nicotine dependence: Secondary | ICD-10-CM | POA: Insufficient documentation

## 2013-12-03 DIAGNOSIS — E119 Type 2 diabetes mellitus without complications: Secondary | ICD-10-CM | POA: Insufficient documentation

## 2013-12-03 DIAGNOSIS — Z794 Long term (current) use of insulin: Secondary | ICD-10-CM | POA: Insufficient documentation

## 2013-12-03 DIAGNOSIS — Z791 Long term (current) use of non-steroidal anti-inflammatories (NSAID): Secondary | ICD-10-CM | POA: Insufficient documentation

## 2013-12-03 DIAGNOSIS — Z7983 Long term (current) use of bisphosphonates: Secondary | ICD-10-CM | POA: Diagnosis not present

## 2013-12-03 DIAGNOSIS — I10 Essential (primary) hypertension: Secondary | ICD-10-CM | POA: Diagnosis not present

## 2013-12-03 DIAGNOSIS — Z880 Allergy status to penicillin: Secondary | ICD-10-CM | POA: Insufficient documentation

## 2013-12-03 DIAGNOSIS — Z79899 Other long term (current) drug therapy: Secondary | ICD-10-CM | POA: Diagnosis not present

## 2013-12-03 DIAGNOSIS — N390 Urinary tract infection, site not specified: Secondary | ICD-10-CM | POA: Insufficient documentation

## 2013-12-03 DIAGNOSIS — Z8739 Personal history of other diseases of the musculoskeletal system and connective tissue: Secondary | ICD-10-CM | POA: Insufficient documentation

## 2013-12-03 DIAGNOSIS — R739 Hyperglycemia, unspecified: Secondary | ICD-10-CM

## 2013-12-03 LAB — URINE MICROSCOPIC-ADD ON

## 2013-12-03 LAB — URINALYSIS, ROUTINE W REFLEX MICROSCOPIC
BILIRUBIN URINE: NEGATIVE
Glucose, UA: >1000 mg/dL — AB
Ketones, ur: NEGATIVE mg/dL
NITRITE: NEGATIVE
Protein, ur: 30 mg/dL — AB
SPECIFIC GRAVITY, URINE: 1.022 (ref 1.005–1.030)
UROBILINOGEN UA: 0.2 mg/dL (ref 0.0–1.0)
pH: 7 (ref 5.0–8.0)

## 2013-12-03 LAB — CBC WITH DIFFERENTIAL/PLATELET
BASOS ABS: 0 10*3/uL (ref 0.0–0.1)
BASOS PCT: 0 % (ref 0–1)
Eosinophils Absolute: 0.3 10*3/uL (ref 0.0–0.7)
Eosinophils Relative: 4 % (ref 0–5)
HCT: 34.4 % — ABNORMAL LOW (ref 36.0–46.0)
HEMOGLOBIN: 11.1 g/dL — AB (ref 12.0–15.0)
LYMPHS PCT: 41 % (ref 12–46)
Lymphs Abs: 3.2 10*3/uL (ref 0.7–4.0)
MCH: 26 pg (ref 26.0–34.0)
MCHC: 32.3 g/dL (ref 30.0–36.0)
MCV: 80.6 fL (ref 78.0–100.0)
Monocytes Absolute: 0.7 10*3/uL (ref 0.1–1.0)
Monocytes Relative: 8 % (ref 3–12)
NEUTROS ABS: 3.6 10*3/uL (ref 1.7–7.7)
NEUTROS PCT: 47 % (ref 43–77)
Platelets: 240 10*3/uL (ref 150–400)
RBC: 4.27 MIL/uL (ref 3.87–5.11)
RDW: 14.4 % (ref 11.5–15.5)
WBC: 7.8 10*3/uL (ref 4.0–10.5)

## 2013-12-03 LAB — BASIC METABOLIC PANEL
ANION GAP: 14 (ref 5–15)
BUN: 21 mg/dL (ref 6–23)
CHLORIDE: 92 meq/L — AB (ref 96–112)
CO2: 27 mEq/L (ref 19–32)
Calcium: 9.9 mg/dL (ref 8.4–10.5)
Creatinine, Ser: 1.32 mg/dL — ABNORMAL HIGH (ref 0.50–1.10)
GFR calc non Af Amer: 39 mL/min — ABNORMAL LOW (ref >90)
GFR, EST AFRICAN AMERICAN: 45 mL/min — AB (ref >90)
Glucose, Bld: 395 mg/dL — ABNORMAL HIGH (ref 70–99)
POTASSIUM: 4 meq/L (ref 3.7–5.3)
Sodium: 133 mEq/L — ABNORMAL LOW (ref 137–147)

## 2013-12-03 LAB — CBG MONITORING, ED: GLUCOSE-CAPILLARY: 384 mg/dL — AB (ref 70–99)

## 2013-12-03 MED ORDER — SODIUM CHLORIDE 0.9 % IV BOLUS (SEPSIS)
1000.0000 mL | Freq: Once | INTRAVENOUS | Status: AC
  Administered 2013-12-03: 1000 mL via INTRAVENOUS

## 2013-12-03 MED ORDER — SODIUM CHLORIDE 0.9 % IV BOLUS (SEPSIS): 500.0000 mL | Freq: Once | INTRAVENOUS | Status: DC

## 2013-12-03 MED ORDER — INSULIN ASPART 100 UNIT/ML ~~LOC~~ SOLN
8.0000 [IU] | Freq: Once | SUBCUTANEOUS | Status: AC
Start: 2013-12-03 — End: 2013-12-03
  Administered 2013-12-03: 8 [IU] via SUBCUTANEOUS
  Filled 2013-12-03: qty 1

## 2013-12-03 NOTE — ED Notes (Signed)
Pt states CBG 855 at home 1600-1700 today, pt states she took fast acting insulin. Denies n/v/d. A & O. Pt states told recently AIC 16. Pt states her meter at home is brand new.

## 2013-12-03 NOTE — ED Notes (Signed)
Blood sugar 270

## 2013-12-03 NOTE — ED Provider Notes (Signed)
CSN: [id number]     Arrival date & time 12/03/13  1908 History   First MD Initiated Contact with Patient 12/03/13 1934     Chief Complaint  Patient presents with  . Hyperglycemia   (Consider location/radiation/quality/duration/timing/severity/associated sxs/prior Treatment) HPI Comments: Patient with history of insulin-dependent diabetes mellitus presents with complaint of hyperglycemia. Patient states that her blood sugars have been poorly controlled over the past 2 months since a change in her insulin regimen. Patient is currently taking Levemir or in the morning and fast acting insulin at lunchtime, dinnertime, and before bed. Patient states that for the past 3 days her blood sugars have been running in the 3-400 range with a peak of 855 at home today. Patient comes to the emergency department due to this elevated blood sugar. Otherwise, she is in good health. She denies any symptoms of illness including URI symptoms, cough, chest pain, and vomiting, diarrhea, urinary symptoms, skin rash. No recent stressors or trauma. The onset of this condition was acute. The course is constant. Aggravating factors: none. Alleviating factors: none.     The history is provided by the patient, the spouse and a relative.    Past Medical History  Diagnosis Date  . Diabetes mellitus without complication   . Hypertension   . Rheumatoid osteoperiostitis    Past Surgical History  Procedure Laterality Date  . Tubal ligation     Family History  Problem Relation Age of Onset  . Cancer Mother   . Diabetes Father    History  Substance Use Topics  . Smoking status: Former Research scientist (life sciences)  . Smokeless tobacco: Not on file  . Alcohol Use: No   OB History   Grav Para Term Preterm Abortions TAB SAB Ect Mult Living                 Review of Systems  Constitutional: Negative for fever.  HENT: Negative for rhinorrhea and sore throat.   Eyes: Negative for redness.  Respiratory: Negative for cough.    Cardiovascular: Negative for chest pain.  Gastrointestinal: Negative for nausea, vomiting, abdominal pain and diarrhea.  Genitourinary: Negative for dysuria, frequency, hematuria, vaginal bleeding and vaginal discharge.  Musculoskeletal: Negative for myalgias.  Skin: Negative for rash.  Neurological: Negative for headaches.   Allergies  Metformin; Penicillins; Pioglitazone; and Rosiglitazone maleate  Home Medications   Prior to Admission medications   Medication Sig Start Date End Date Taking? Authorizing Provider  alendronate (FOSAMAX) 70 MG tablet Take 70 mg by mouth every 7 (seven) days. Take with a full glass of water on an empty stomach.    Historical Provider, MD  benazepril (LOTENSIN) 40 MG tablet Take 40 mg by mouth daily.    Historical Provider, MD  cholecalciferol (VITAMIN D) 1000 UNITS tablet Take 1,000 Units by mouth once a week.    Historical Provider, MD  diltiazem (DILACOR XR) 180 MG 24 hr capsule Take 180 mg by mouth daily.    Historical Provider, MD  hydrochlorothiazide (HYDRODIURIL) 25 MG tablet Take 25 mg by mouth daily.    Historical Provider, MD  insulin glargine (LANTUS) 100 UNIT/ML injection Inject 10 Units into the skin at bedtime.     Historical Provider, MD  Multiple Vitamin (MULTIVITAMIN WITH MINERALS) TABS Take 1 tablet by mouth daily.    Historical Provider, MD  naproxen sodium (ALEVE) 220 MG tablet Take 440 mg by mouth at bedtime as needed (for pain).    Historical Provider, MD  rosuvastatin (CRESTOR) 10 MG tablet Take  10 mg by mouth daily.    Historical Provider, MD  traMADol (ULTRAM) 50 MG tablet Take 1 tablet (50 mg total) by mouth every 6 (six) hours as needed. 03/15/13   Orpah Greek, MD  Vitamin D, Ergocalciferol, (DRISDOL) 50000 UNITS CAPS capsule Take 50,000 Units by mouth 2 (two) times a week.    Historical Provider, MD   BP 157/71  Pulse 86  Temp(Src) 98.9 F (37.2 C) (Oral)  Resp 18  Wt 174 lb (78.926 kg)  SpO2 98%  Physical Exam   Nursing note and vitals reviewed. Constitutional: She appears well-developed and well-nourished.  HENT:  Head: Normocephalic and atraumatic.  Eyes: Conjunctivae are normal. Right eye exhibits no discharge. Left eye exhibits no discharge.  Neck: Normal range of motion. Neck supple.  Cardiovascular: Normal rate, regular rhythm and normal heart sounds.   Pulmonary/Chest: Effort normal and breath sounds normal.  Abdominal: Soft. There is no tenderness.  Neurological: She is alert.  Skin: Skin is warm and dry.  Psychiatric: She has a normal mood and affect.    ED Course  Procedures (including critical care time) Labs Review Labs Reviewed  CBC WITH DIFFERENTIAL - Abnormal; Notable for the following:    Hemoglobin 11.1 (*)    HCT 34.4 (*)    All other components within normal limits  BASIC METABOLIC PANEL - Abnormal; Notable for the following:    Sodium 133 (*)    Chloride 92 (*)    Glucose, Bld 395 (*)    Creatinine, Ser 1.32 (*)    GFR calc non Af Amer 39 (*)    GFR calc Af Amer 45 (*)    All other components within normal limits  URINALYSIS, ROUTINE W REFLEX MICROSCOPIC - Abnormal; Notable for the following:    Glucose, UA >1000 (*)    Hgb urine dipstick SMALL (*)    Protein, ur 30 (*)    Leukocytes, UA MODERATE (*)    All other components within normal limits  URINE MICROSCOPIC-ADD ON - Abnormal; Notable for the following:    Squamous Epithelial / LPF FEW (*)    Bacteria, UA FEW (*)    All other components within normal limits  CBG MONITORING, ED - Abnormal; Notable for the following:    Glucose-Capillary 384 (*)    All other components within normal limits  URINE CULTURE  CBG MONITORING, ED    Imaging Review No results found.   EKG Interpretation None      7:36 PM Patient seen and examined. Work-up initiated. Medications ordered. Pt discussed with Dr. Ashok Cordia who will see.   Vital signs reviewed and are as follows: Filed Vitals:   12/03/13 1921  BP: 157/71   Pulse: 86  Temp: 98.9 F (37.2 C)  Resp: 18   12:13 AM Dr. Ashok Cordia has seen patient earlier. Pt has received SQ insulin and fluids. Blood sugar into upper 200's with this treatment. UA shows possible UTI -- tx with Bactrim, culture sent. Patient and family informed of all results. They're comfortable with discharge to home.  Encouraged the patient to monitor her blood sugars closely and return with sugars greater than 500. Patient encouraged to followup with her physician in 2-3 days for a recheck of her urine and discussion of her insulin management.  Patient urged to return with other concerns. Patient verbalizes understanding and agrees with plan.   MDM   Final diagnoses:  Hyperglycemia  UTI (lower urinary tract infection)   Hyperglycemia: Peak of 384 in  emergency Department improved to 270 with fluids and subcutaneous insulin. No signs of DKA. Patient appears well and is at her baseline. UA suggestive of UTI with 11-20 white blood cells, moderate leukocytes, few bacteria. Given patient is diabetic with hyperglycemia will treat as UTI. Culture is pending. Patient has good primary care physician followup and family support. She seems reliable to return with elevated blood sugars if her symptoms worsen. She is comfortable with discharge home. Plan discussed with patient and family at bedside.  No dangerous or life-threatening conditions suspected or identified by history, physical exam, and by work-up. No indications for hospitalization identified.      Carlisle Cater, PA-C 12/04/13 [phone]

## 2013-12-03 NOTE — ED Notes (Signed)
On assessment patient states she has been taking her medications as ordered, however her BS have been running high for a while. Since she was switched with insulins her levels have been up. Patient denies feeling sick.

## 2013-12-04 MED ORDER — SULFAMETHOXAZOLE-TMP DS 800-160 MG PO TABS
1.0000 | ORAL_TABLET | Freq: Once | ORAL | Status: AC
  Administered 2013-12-04: 1 via ORAL
  Filled 2013-12-04: qty 1

## 2013-12-04 MED ORDER — SULFAMETHOXAZOLE-TRIMETHOPRIM 800-160 MG PO TABS: 1.0000 | ORAL_TABLET | Freq: Two times a day (BID) | ORAL | Status: DC

## 2013-12-04 NOTE — Discharge Instructions (Signed)
Please read and follow all provided instructions.  Your diagnoses today include:  1. Hyperglycemia   2. UTI (lower urinary tract infection)     Tests performed today include:  Urine test - suggests that you have an infection in your bladder  Blood counts and electrolytes - high blood sugar without any acid build-up or other problems  Vital signs. See below for your results today.   Medications prescribed:   Bactrim (trimethoprim/sulfamethoxazole) - antibiotic  You have been prescribed an antibiotic medicine: take the entire course of medicine even if you are feeling better. Stopping early can cause the antibiotic not to work.  Home care instructions:  Follow any educational materials contained in this packet.  Follow-up instructions: Please follow-up with your primary care provider in 3 days for further evaluation of your symptoms.  Return instructions:   Please return to the Emergency Department if you experience worsening symptoms.   Return with fever, worsening pain, persistent vomiting, worsening pain in your back.   Please return if you have any other emergent concerns.  Additional Information:  Your vital signs today were: BP 157/71   Pulse 86   Temp(Src) 98.9 F (37.2 C) (Oral)   Resp 18   Wt 174 lb (78.926 kg)   SpO2 98% If your blood pressure (BP) was elevated above 135/85 this visit, please have this repeated by your doctor within one month. --------------

## 2013-12-05 LAB — CBG MONITORING, ED: Glucose-Capillary: 280 mg/dL — ABNORMAL HIGH (ref 70–99)

## 2013-12-05 NOTE — ED Provider Notes (Signed)
Medical screening examination/treatment/procedure(s) were conducted as a shared visit with non-physician practitioner(s) and myself.  I personally evaluated the patient during the encounter.  Pt with hx iddm, c/o high blood sugars in past few weeks. No nvd. No fever or chills. abd soft nt. Iv ns boluses. Labs.    Mirna Mires, MD 12/05/13 [phone]

## 2013-12-07 LAB — URINE CULTURE

## 2013-12-26 ENCOUNTER — Other Ambulatory Visit (HOSPITAL_COMMUNITY): Payer: Self-pay | Admitting: Internal Medicine

## 2013-12-26 DIAGNOSIS — Z1231 Encounter for screening mammogram for malignant neoplasm of breast: Secondary | ICD-10-CM

## 2014-01-17 ENCOUNTER — Ambulatory Visit (HOSPITAL_COMMUNITY)
Admission: RE | Admit: 2014-01-17 | Discharge: 2014-01-17 | Disposition: A | Discharge status: Home / Self Care | Payer: Medicare Other | Source: Ambulatory Visit | Attending: Internal Medicine | Admitting: Internal Medicine

## 2014-01-17 DIAGNOSIS — Z1231 Encounter for screening mammogram for malignant neoplasm of breast: Secondary | ICD-10-CM | POA: Insufficient documentation

## 2014-04-25 ENCOUNTER — Encounter: Payer: Self-pay | Admitting: Surgery

## 2014-04-25 ENCOUNTER — Other Ambulatory Visit: Payer: Self-pay | Admitting: *Deleted

## 2014-04-25 DIAGNOSIS — I739 Peripheral vascular disease, unspecified: Secondary | ICD-10-CM

## 2014-05-19 ENCOUNTER — Encounter: Payer: Self-pay | Admitting: Surgery

## 2014-05-22 ENCOUNTER — Encounter: Payer: Medicare Other | Admitting: Surgery

## 2014-05-22 ENCOUNTER — Encounter (HOSPITAL_COMMUNITY): Payer: Medicare Other

## 2014-07-01 ENCOUNTER — Encounter (HOSPITAL_COMMUNITY): Payer: Self-pay | Admitting: Emergency Medicine

## 2014-07-01 ENCOUNTER — Emergency Department (HOSPITAL_COMMUNITY)
Admission: EM | Admit: 2014-07-01 | Discharge: 2014-07-01 | Disposition: A | Discharge status: Home / Self Care | Payer: Medicare Other | Attending: Emergency Medicine | Admitting: Emergency Medicine

## 2014-07-01 DIAGNOSIS — Z792 Long term (current) use of antibiotics: Secondary | ICD-10-CM | POA: Diagnosis not present

## 2014-07-01 DIAGNOSIS — Z87891 Personal history of nicotine dependence: Secondary | ICD-10-CM | POA: Diagnosis not present

## 2014-07-01 DIAGNOSIS — E11621 Type 2 diabetes mellitus with foot ulcer: Secondary | ICD-10-CM | POA: Diagnosis not present

## 2014-07-01 DIAGNOSIS — Z794 Long term (current) use of insulin: Secondary | ICD-10-CM | POA: Diagnosis not present

## 2014-07-01 DIAGNOSIS — L97529 Non-pressure chronic ulcer of other part of left foot with unspecified severity: Secondary | ICD-10-CM | POA: Diagnosis not present

## 2014-07-01 DIAGNOSIS — M069 Rheumatoid arthritis, unspecified: Secondary | ICD-10-CM | POA: Diagnosis not present

## 2014-07-01 DIAGNOSIS — Z79899 Other long term (current) drug therapy: Secondary | ICD-10-CM | POA: Insufficient documentation

## 2014-07-01 DIAGNOSIS — I10 Essential (primary) hypertension: Secondary | ICD-10-CM | POA: Diagnosis not present

## 2014-07-01 DIAGNOSIS — L988 Other specified disorders of the skin and subcutaneous tissue: Secondary | ICD-10-CM | POA: Diagnosis present

## 2014-07-01 DIAGNOSIS — Z880 Allergy status to penicillin: Secondary | ICD-10-CM | POA: Insufficient documentation

## 2014-07-01 DIAGNOSIS — L97509 Non-pressure chronic ulcer of other part of unspecified foot with unspecified severity: Secondary | ICD-10-CM

## 2014-07-01 MED ORDER — CEPHALEXIN 500 MG PO CAPS: 500.0000 mg | ORAL_CAPSULE | Freq: Four times a day (QID) | ORAL | Status: DC

## 2014-07-01 NOTE — ED Provider Notes (Signed)
Medical screening examination/treatment/procedure(s) were performed by non-physician practitioner and as supervising physician I was immediately available for consultation/collaboration.   EKG Interpretation None      Briefly, pt is a 74 y.o. female presenting with L great toe blister.  No systemic symptoms.  I performed an examination on the patient including cardiac, pulmonary, and gi systems which were unremarkable.  L toe NV intact, no signs of erythema or infection, likely healing ulcer.  Strict return precautions given.  DC home in stable condition.     Debby Freiberg, MD 07/01/14 [phone]

## 2014-07-01 NOTE — Discharge Instructions (Signed)
Diabetes and Foot Care Diabetes may cause you to have problems because of poor blood supply (circulation) to your feet and legs. This may cause the skin on your feet to become thinner, break easier, and heal more slowly. Your skin may become dry, and the skin may peel and crack. You may also have nerve damage in your legs and feet causing decreased feeling in them. You may not notice minor injuries to your feet that could lead to infections or more serious problems. Taking care of your feet is one of the most important things you can do for yourself.  HOME CARE INSTRUCTIONS  Wear shoes at all times, even in the house. Do not go barefoot. Bare feet are easily injured.  Check your feet daily for blisters, cuts, and redness. If you cannot see the bottom of your feet, use a mirror or ask someone for help.  Wash your feet with warm water (do not use hot water) and mild soap. Then pat your feet and the areas between your toes until they are completely dry. Do not soak your feet as this can dry your skin.  Apply a moisturizing lotion or petroleum jelly (that does not contain alcohol and is unscented) to the skin on your feet and to dry, brittle toenails. Do not apply lotion between your toes.  Trim your toenails straight across. Do not dig under them or around the cuticle. File the edges of your nails with an emery board or nail file.  Do not cut corns or calluses or try to remove them with medicine.  Wear clean socks or stockings every day. Make sure they are not too tight. Do not wear knee-high stockings since they may decrease blood flow to your legs.  Wear shoes that fit properly and have enough cushioning. To break in new shoes, wear them for just a few hours a day. This prevents you from injuring your feet. Always look in your shoes before you put them on to be sure there are no objects inside.  Do not cross your legs. This may decrease the blood flow to your feet.  If you find a minor scrape,  cut, or break in the skin on your feet, keep it and the skin around it clean and dry. These areas may be cleansed with mild soap and water. Do not cleanse the area with peroxide, alcohol, or iodine.  When you remove an adhesive bandage, be sure not to damage the skin around it.  If you have a wound, look at it several times a day to make sure it is healing.  Do not use heating pads or hot water bottles. They may burn your skin. If you have lost feeling in your feet or legs, you may not know it is happening until it is too late.  Make sure your health care provider performs a complete foot exam at least annually or more often if you have foot problems. Report any cuts, sores, or bruises to your health care provider immediately. SEEK MEDICAL CARE IF:   You have an injury that is not healing.  You have cuts or breaks in the skin.  You have an ingrown nail.  You notice redness on your legs or feet.  You feel burning or tingling in your legs or feet.  You have pain or cramps in your legs and feet.  Your legs or feet are numb.  Your feet always feel cold. SEEK IMMEDIATE MEDICAL CARE IF:   There is increasing redness,  swelling, or pain in or around a wound.  There is a red line that goes up your leg.  Pus is coming from a wound.  You develop a fever or as directed by your health care provider.  You notice a bad smell coming from an ulcer or wound. Document Released: 04/25/2000 Document Revised: 12/29/2012 Document Reviewed: 10/05/2012 [facility] Patient Information 2015 [place], [place]. This information is not intended to replace advice given to you by your health care provider. Make sure you discuss any questions you have with your health care provider.  Skin Ulcer A skin ulcer is an open sore that can be shallow or deep. Skin ulcers sometimes become infected and are difficult to treat. It may be 1 month or longer before real healing progress is made. CAUSES   Injury.  Problems  with the veins or arteries.  Diabetes.  Insect bites.  Bedsores.  Inflammatory conditions. SYMPTOMS   Pain, redness, swelling, and tenderness around the ulcer.  Fever.  Bleeding from the ulcer.  Yellow or clear fluid coming from the ulcer. DIAGNOSIS  There are many types of skin ulcers. Any open sores will be examined. Certain tests will be done to determine the kind of ulcer you have. The right treatment depends on the type of ulcer you have. TREATMENT  Treatment is a long-term challenge. It may include:  Wearing an elastic wrap, compression stockings, or gel cast over the ulcer area.  Taking antibiotic medicines or putting antibiotic creams on the affected area if there is an infection. HOME CARE INSTRUCTIONS  Put on your bandages (dressings), wraps, or casts over the ulcer as directed by your caregiver.  Change all dressings as directed by your caregiver.  Take all medicines as directed by your caregiver.  Keep the affected area clean and dry.  Avoid injuries to the affected area.  Eat a well-balanced, healthy diet that includes plenty of fruit and vegetables.  If you smoke, consider quitting or decreasing the amount of cigarettes you smoke.  Once the ulcer heals, get regular exercise as directed by your caregiver.  Work with your caregiver to make sure your blood pressure, cholesterol, and diabetes are well-controlled.  Keep your skin moisturized. Dry skin can crack and lead to skin ulcers. SEEK IMMEDIATE MEDICAL CARE IF:   Your pain gets worse.  You have swelling, redness, or fluids around the ulcer.  You have chills.  You have a fever. MAKE SURE YOU:   Understand these instructions.  Will watch your condition.  Will get help right away if you are not doing well or get worse. Document Released: 06/05/2004 Document Revised: 07/21/2011 Document Reviewed: 12/13/2010 [facility] Patient Information 2015 [place], [place]. This information is not intended to  replace advice given to you by your health care provider. Make sure you discuss any questions you have with your health care provider.

## 2014-07-01 NOTE — ED Provider Notes (Signed)
CSN: [id number]     Arrival date & time 07/01/14  1531 History   First MD Initiated Contact with Patient 07/01/14 1600     This chart was scribed for non-physician practitioner, Margarita Mail, PA-C, working with No att. providers found by Forrestine Him, ED Scribe. This patient was seen in room [place] and the patient's care was started at 4:25 PM.   Chief Complaint  Patient presents with  . toe blister    HPI  HPI Comments: [name] is a 74 y.o. female with a PMHx of DM, HTN, rheumatoid osteoperiostitis who presents to the Emergency Department complaining of a toe blister to L great toe with a mild associated foul odor x 5 days. No redness or drainage noted from area. No new injury or trauma to foot. Pt states calluses to feet are baseline. However, new blister recently "popped up". [name] has applied Peroxide, Betadine, and Neosporin to area with daily dressings BID. No recent fever or chills. Pt states blood sugars fluctuate from 48 to 550's. Last A1C 13. Pt with known allergies to Metformin, Penicillins, Pioglitazone, and Rosiglitazone Maleate.  She is followed by: Maximino Greenland, MD-PCP  Past Medical History  Diagnosis Date  . Diabetes mellitus without complication   . Hypertension   . Rheumatoid osteoperiostitis    Past Surgical History  Procedure Laterality Date  . Tubal ligation     Family History  Problem Relation Age of Onset  . Cancer Mother   . Diabetes Father    History  Substance Use Topics  . Smoking status: Former Research scientist (life sciences)  . Smokeless tobacco: Not on file  . Alcohol Use: No   OB History    No data available     Review of Systems  Constitutional: Negative for fever and chills.  HENT: Negative for trouble swallowing.   Respiratory: Negative for shortness of breath.   Cardiovascular: Negative for chest pain.  Gastrointestinal: Negative for nausea, vomiting, abdominal pain, diarrhea and constipation.  Genitourinary: Negative for dysuria and  hematuria.  Musculoskeletal: Negative for myalgias and arthralgias.  Skin: Positive for wound. Negative for rash.  Neurological: Negative for numbness.  All other systems reviewed and are negative.     Allergies  Rosiglitazone maleate; Metformin; Penicillins; and Pioglitazone  Home Medications   Prior to Admission medications   Medication Sig Start Date End Date Taking? Authorizing Provider  alendronate (FOSAMAX) 70 MG tablet Take 70 mg by mouth every 7 (seven) days. Take with a full glass of water on an empty stomach.   Yes Historical Provider, MD  benazepril (LOTENSIN) 40 MG tablet Take 40 mg by mouth daily.   Yes Historical Provider, MD  cholecalciferol (VITAMIN D) 1000 UNITS tablet Take 1,000 Units by mouth daily.    Yes Historical Provider, MD  diltiazem (DILACOR XR) 180 MG 24 hr capsule Take 180 mg by mouth daily.   Yes Historical Provider, MD  glimepiride (AMARYL) 2 MG tablet Take 2 mg by mouth 2 (two) times daily.   Yes Historical Provider, MD  insulin lispro protamine-lispro (HUMALOG 75/25 MIX) (75-25) 100 UNIT/ML SUSP injection Inject 25-32 Units into the skin 2 (two) times daily with a meal. Takes 32 units in the morning and 25 units at night   Yes Historical Provider, MD  naphazoline-glycerin (CLEAR EYES) 0.012-0.2 % SOLN Place 1-2 drops into both eyes every 4 (four) hours as needed for irritation.   Yes Historical Provider, MD  naproxen sodium (ALEVE) 220 MG tablet Take 440 mg by  mouth at bedtime as needed (for pain).   Yes Historical Provider, MD  Omega-3 Fatty Acids (FISH OIL PO) Take 1 capsule by mouth daily.   Yes Historical Provider, MD  simvastatin (ZOCOR) 20 MG tablet Take 20 mg by mouth daily.   Yes Historical Provider, MD  cephALEXin (KEFLEX) 500 MG capsule Take 1 capsule (500 mg total) by mouth 4 (four) times daily. 07/01/14   Margarita Mail, PA-C  sulfamethoxazole-trimethoprim (SEPTRA DS) 800-160 MG per tablet Take 1 tablet by mouth every 12 (twelve) hours. Patient  not taking: Reported on 07/01/2014 12/04/13   Carlisle Cater, PA-C   Triage Vitals: BP 188/70 mmHg  Pulse 81  Temp(Src) 98.5 F (36.9 C) (Oral)  Resp 16  SpO2 100%   Physical Exam  Constitutional: She is oriented to person, place, and time. She appears well-developed and well-nourished.  HENT:  Head: Normocephalic.  Eyes: EOM are normal.  Neck: Normal range of motion.  Pulmonary/Chest: Effort normal.  Abdominal: She exhibits no distension.  Musculoskeletal: Normal range of motion.  Neurological: She is alert and oriented to person, place, and time.  Psychiatric: She has a normal mood and affect.  Nursing note and vitals reviewed.   ED Course  Procedures (including critical care time)  DIAGNOSTIC STUDIES: Oxygen Saturation is 100% on RA, Normal by my interpretation.    COORDINATION OF CARE: 4:32 PM-Discussed treatment plan with pt at bedside and pt agreed to plan.     Labs Review Labs Reviewed - No data to display  Imaging Review No results found.    EKG Interpretation None      MDM   Final diagnoses:  Diabetic toe ulcer    Patient with ulceration of the foot. No signs of deep infection. Discharge with Keflex.  Supportive care. F/u with pcp. I personally performed the services described in this documentation, which was scribed in my presence. The recorded information has been reviewed and is accurate.       Margarita Mail, PA-C 07/12/14 1328  Debby Freiberg, MD 07/14/14 [phone]

## 2014-07-01 NOTE — ED Notes (Addendum)
Pt from home c/o blister that "popped up" 5 days ago. Pt sts that she has been using peroxide, betadine and neosporin with daily dressing. Pt has small open blister to L great toe. No redness or oozing noted. Pt denies injury but that she is a diabetic. Pt denies fevers or other c/o. Pt is A&O and in NAD

## 2014-10-06 ENCOUNTER — Other Ambulatory Visit: Payer: Self-pay | Admitting: Internal Medicine

## 2014-10-06 DIAGNOSIS — R269 Unspecified abnormalities of gait and mobility: Secondary | ICD-10-CM

## 2014-10-14 ENCOUNTER — Ambulatory Visit
Admission: RE | Admit: 2014-10-14 | Discharge: 2014-10-14 | Disposition: A | Discharge status: Home / Self Care | Payer: Medicare Other | Source: Ambulatory Visit | Attending: Internal Medicine | Admitting: Internal Medicine

## 2014-10-14 DIAGNOSIS — R269 Unspecified abnormalities of gait and mobility: Secondary | ICD-10-CM

## 2015-02-01 ENCOUNTER — Ambulatory Visit
Admission: RE | Admit: 2015-02-01 | Discharge: 2015-02-01 | Disposition: A | Discharge status: Home / Self Care | Payer: Medicare Other | Source: Ambulatory Visit | Attending: Internal Medicine | Admitting: Internal Medicine

## 2015-02-01 ENCOUNTER — Other Ambulatory Visit: Payer: Self-pay | Admitting: Internal Medicine

## 2015-02-01 DIAGNOSIS — M25561 Pain in right knee: Secondary | ICD-10-CM

## 2015-07-17 DIAGNOSIS — M129 Arthropathy, unspecified: Secondary | ICD-10-CM | POA: Diagnosis not present

## 2015-08-01 DIAGNOSIS — N183 Chronic kidney disease, stage 3 (moderate): Secondary | ICD-10-CM | POA: Diagnosis not present

## 2015-08-01 DIAGNOSIS — E1122 Type 2 diabetes mellitus with diabetic chronic kidney disease: Secondary | ICD-10-CM | POA: Diagnosis not present

## 2015-08-01 DIAGNOSIS — N08 Glomerular disorders in diseases classified elsewhere: Secondary | ICD-10-CM | POA: Diagnosis not present

## 2015-08-01 DIAGNOSIS — I129 Hypertensive chronic kidney disease with stage 1 through stage 4 chronic kidney disease, or unspecified chronic kidney disease: Secondary | ICD-10-CM | POA: Diagnosis not present

## 2015-08-09 DIAGNOSIS — M810 Age-related osteoporosis without current pathological fracture: Secondary | ICD-10-CM | POA: Diagnosis not present

## 2015-08-09 DIAGNOSIS — E559 Vitamin D deficiency, unspecified: Secondary | ICD-10-CM | POA: Diagnosis not present

## 2015-08-09 DIAGNOSIS — E11319 Type 2 diabetes mellitus with unspecified diabetic retinopathy without macular edema: Secondary | ICD-10-CM | POA: Diagnosis not present

## 2015-08-09 DIAGNOSIS — E1165 Type 2 diabetes mellitus with hyperglycemia: Secondary | ICD-10-CM | POA: Diagnosis not present

## 2015-08-09 DIAGNOSIS — M109 Gout, unspecified: Secondary | ICD-10-CM | POA: Diagnosis not present

## 2015-08-09 DIAGNOSIS — I10 Essential (primary) hypertension: Secondary | ICD-10-CM | POA: Diagnosis not present

## 2015-08-09 DIAGNOSIS — Z79899 Other long term (current) drug therapy: Secondary | ICD-10-CM | POA: Diagnosis not present

## 2015-08-09 DIAGNOSIS — E11621 Type 2 diabetes mellitus with foot ulcer: Secondary | ICD-10-CM | POA: Diagnosis not present

## 2015-08-17 ENCOUNTER — Emergency Department (HOSPITAL_COMMUNITY): Payer: Medicare Other

## 2015-08-17 ENCOUNTER — Encounter (HOSPITAL_COMMUNITY): Payer: Self-pay | Admitting: Emergency Medicine

## 2015-08-17 ENCOUNTER — Emergency Department (HOSPITAL_COMMUNITY)
Admission: EM | Admit: 2015-08-17 | Discharge: 2015-08-17 | Disposition: A | Discharge status: Home / Self Care | Payer: Medicare Other | Attending: Emergency Medicine | Admitting: Emergency Medicine

## 2015-08-17 DIAGNOSIS — E860 Dehydration: Secondary | ICD-10-CM | POA: Diagnosis not present

## 2015-08-17 DIAGNOSIS — Z79899 Other long term (current) drug therapy: Secondary | ICD-10-CM | POA: Insufficient documentation

## 2015-08-17 DIAGNOSIS — Z880 Allergy status to penicillin: Secondary | ICD-10-CM | POA: Insufficient documentation

## 2015-08-17 DIAGNOSIS — E1165 Type 2 diabetes mellitus with hyperglycemia: Secondary | ICD-10-CM | POA: Diagnosis not present

## 2015-08-17 DIAGNOSIS — Z8739 Personal history of other diseases of the musculoskeletal system and connective tissue: Secondary | ICD-10-CM | POA: Insufficient documentation

## 2015-08-17 DIAGNOSIS — Z7984 Long term (current) use of oral hypoglycemic drugs: Secondary | ICD-10-CM | POA: Diagnosis not present

## 2015-08-17 DIAGNOSIS — K5900 Constipation, unspecified: Secondary | ICD-10-CM | POA: Diagnosis not present

## 2015-08-17 DIAGNOSIS — Z87891 Personal history of nicotine dependence: Secondary | ICD-10-CM | POA: Insufficient documentation

## 2015-08-17 DIAGNOSIS — R739 Hyperglycemia, unspecified: Secondary | ICD-10-CM

## 2015-08-17 DIAGNOSIS — Z792 Long term (current) use of antibiotics: Secondary | ICD-10-CM | POA: Diagnosis not present

## 2015-08-17 DIAGNOSIS — I10 Essential (primary) hypertension: Secondary | ICD-10-CM | POA: Diagnosis not present

## 2015-08-17 DIAGNOSIS — Z794 Long term (current) use of insulin: Secondary | ICD-10-CM | POA: Insufficient documentation

## 2015-08-17 DIAGNOSIS — N390 Urinary tract infection, site not specified: Secondary | ICD-10-CM | POA: Diagnosis not present

## 2015-08-17 LAB — CBC WITH DIFFERENTIAL/PLATELET
Basophils Absolute: 0 10*3/uL (ref 0.0–0.1)
Basophils Relative: 0 %
Eosinophils Absolute: 0.2 10*3/uL (ref 0.0–0.7)
Eosinophils Relative: 2 %
HCT: 39.5 % (ref 36.0–46.0)
Hemoglobin: 12.7 g/dL (ref 12.0–15.0)
Lymphocytes Relative: 43 %
Lymphs Abs: 4 10*3/uL (ref 0.7–4.0)
MCH: 26.6 pg (ref 26.0–34.0)
MCHC: 32.2 g/dL (ref 30.0–36.0)
MCV: 82.6 fL (ref 78.0–100.0)
Monocytes Absolute: 0.7 10*3/uL (ref 0.1–1.0)
Monocytes Relative: 8 %
Neutro Abs: 4.5 10*3/uL (ref 1.7–7.7)
Neutrophils Relative %: 47 %
Platelets: 264 10*3/uL (ref 150–400)
RBC: 4.78 MIL/uL (ref 3.87–5.11)
RDW: 14.6 % (ref 11.5–15.5)
WBC: 9.5 10*3/uL (ref 4.0–10.5)

## 2015-08-17 LAB — URINALYSIS, ROUTINE W REFLEX MICROSCOPIC
Bilirubin Urine: NEGATIVE
Glucose, UA: >1000 mg/dL — AB
Hgb urine dipstick: NEGATIVE
Ketones, ur: NEGATIVE mg/dL
Nitrite: NEGATIVE
Protein, ur: 30 mg/dL — AB
Specific Gravity, Urine: 1.019 (ref 1.005–1.030)
pH: 5.5 (ref 5.0–8.0)

## 2015-08-17 LAB — BASIC METABOLIC PANEL WITH GFR
Anion gap: 11 (ref 5–15)
BUN: 43 mg/dL — ABNORMAL HIGH (ref 6–20)
CO2: 27 mmol/L (ref 22–32)
Calcium: 9.3 mg/dL (ref 8.9–10.3)
Chloride: 101 mmol/L (ref 101–111)
Creatinine, Ser: 2.12 mg/dL — ABNORMAL HIGH (ref 0.44–1.00)
GFR calc Af Amer: 25 mL/min — ABNORMAL LOW
GFR calc non Af Amer: 22 mL/min — ABNORMAL LOW
Glucose, Bld: 260 mg/dL — ABNORMAL HIGH (ref 65–99)
Potassium: 4 mmol/L (ref 3.5–5.1)
Sodium: 139 mmol/L (ref 135–145)

## 2015-08-17 LAB — URINE MICROSCOPIC-ADD ON

## 2015-08-17 LAB — CBG MONITORING, ED
Glucose-Capillary: 371 mg/dL — ABNORMAL HIGH (ref 65–99)
Glucose-Capillary: 295 mg/dL — ABNORMAL HIGH (ref 65–99)

## 2015-08-17 MED ORDER — INSULIN ASPART 100 UNIT/ML ~~LOC~~ SOLN
8.0000 [IU] | Freq: Once | SUBCUTANEOUS | Status: AC
  Administered 2015-08-17: 8 [IU] via INTRAVENOUS
  Filled 2015-08-17: qty 1

## 2015-08-17 MED ORDER — SODIUM CHLORIDE 0.9 % IV BOLUS (SEPSIS)
1000.0000 mL | Freq: Once | INTRAVENOUS | Status: AC
  Administered 2015-08-17: 1000 mL via INTRAVENOUS

## 2015-08-17 MED ORDER — CIPROFLOXACIN HCL 500 MG PO TABS: 500.0000 mg | ORAL_TABLET | Freq: Two times a day (BID) | ORAL | Status: DC

## 2015-08-17 NOTE — Discharge Instructions (Signed)
Cipro as prescribed.  Continue your medications as before. Keep a record of your blood sugars over the next several days so that you can follow these up with your primary Dr. next week to discuss possible medication adjustments.   Hyperglycemia Hyperglycemia occurs when the glucose (sugar) in your blood is too high. Hyperglycemia can happen for many reasons, but it most often happens to people who do not know they have diabetes or are not managing their diabetes properly.  CAUSES  Whether you have diabetes or not, there are other causes of hyperglycemia. Hyperglycemia can occur when you have diabetes, but it can also occur in other situations that you might not be as aware of, such as: Diabetes  If you have diabetes and are having problems controlling your blood glucose, hyperglycemia could occur because of some of the following reasons:  Not following your meal plan.  Not taking your diabetes medications or not taking it properly.  Exercising less or doing less activity than you normally do.  Being sick. Pre-diabetes  This cannot be ignored. Before people develop Type 2 diabetes, they almost always have "pre-diabetes." This is when your blood glucose levels are higher than normal, but not yet high enough to be diagnosed as diabetes. Research has shown that some long-term damage to the body, especially the heart and circulatory system, may already be occurring during pre-diabetes. If you take action to manage your blood glucose when you have pre-diabetes, you may delay or prevent Type 2 diabetes from developing. Stress  If you have diabetes, you may be "diet" controlled or on oral medications or insulin to control your diabetes. However, you may find that your blood glucose is higher than usual in the hospital whether you have diabetes or not. This is often referred to as "stress hyperglycemia." Stress can elevate your blood glucose. This happens because of hormones put out by the body  during times of stress. If stress has been the cause of your high blood glucose, it can be followed regularly by your caregiver. That way he/she can make sure your hyperglycemia does not continue to get worse or progress to diabetes. Steroids  Steroids are medications that act on the infection fighting system (immune system) to block inflammation or infection. One side effect can be a rise in blood glucose. Most people can produce enough extra insulin to allow for this rise, but for those who cannot, steroids make blood glucose levels go even higher. It is not unusual for steroid treatments to "uncover" diabetes that is developing. It is not always possible to determine if the hyperglycemia will go away after the steroids are stopped. A special blood test called an A1c is sometimes done to determine if your blood glucose was elevated before the steroids were started. SYMPTOMS  Thirsty.  Frequent urination.  Dry mouth.  Blurred vision.  Tired or fatigue.  Weakness.  Sleepy.  Tingling in feet or leg. DIAGNOSIS  Diagnosis is made by monitoring blood glucose in one or all of the following ways:  A1c test. This is a chemical found in your blood.  Fingerstick blood glucose monitoring.  Laboratory results. TREATMENT  First, knowing the cause of the hyperglycemia is important before the hyperglycemia can be treated. Treatment may include, but is not be limited to:  Education.  Change or adjustment in medications.  Change or adjustment in meal plan.  Treatment for an illness, infection, etc.  More frequent blood glucose monitoring.  Change in exercise plan.  Decreasing or stopping  steroids.  Lifestyle changes. HOME CARE INSTRUCTIONS   Test your blood glucose as directed.  Exercise regularly. Your caregiver will give you instructions about exercise. Pre-diabetes or diabetes which comes on with stress is helped by exercising.  Eat wholesome, balanced meals. Eat often and at  regular, fixed times. Your caregiver or nutritionist will give you a meal plan to guide your sugar intake.  Being at an ideal weight is important. If needed, losing as little as 10 to 15 pounds may help improve blood glucose levels. SEEK MEDICAL CARE IF:   You have questions about medicine, activity, or diet.  You continue to have symptoms (problems such as increased thirst, urination, or weight gain). SEEK IMMEDIATE MEDICAL CARE IF:   You are vomiting or have diarrhea.  Your breath smells fruity.  You are breathing faster or slower.  You are very sleepy or incoherent.  You have numbness, tingling, or pain in your feet or hands.  You have chest pain.  Your symptoms get worse even though you have been following your caregiver's orders.  If you have any other questions or concerns.   This information is not intended to replace advice given to you by your health care provider. Make sure you discuss any questions you have with your health care provider.   Document Released: 10/22/2000 Document Revised: 07/21/2011 Document Reviewed: 01/02/2015 Elsevier Interactive Patient Education 2016 Else[employer]  Urinary Tract Infection Urinary tract infections (UTIs) can develop anywhere along your urinary tract. Your urinary tract is your body's drainage system for removing wastes and extra water. Your urinary tract includes two kidneys, two ureters, a bladder, and a urethra. Your kidneys are a pair of bean-shaped organs. Each kidney is about the size of your fist. They are located below your ribs, one on each side of your spine. CAUSES Infections are caused by microbes, which are microscopic organisms, including fungi, viruses, and bacteria. These organisms are so small that they can only be seen through a microscope. Bacteria are the microbes that most commonly cause UTIs. SYMPTOMS  Symptoms of UTIs may vary by age and gender of the patient and by the location of the infection. Symptoms in  young women typically include a frequent and intense urge to urinate and a painful, burning feeling in the bladder or urethra during urination. Older women and men are more likely to be tired, shaky, and weak and have muscle aches and abdominal pain. A fever may mean the infection is in your kidneys. Other symptoms of a kidney infection include pain in your back or sides below the ribs, nausea, and vomiting. DIAGNOSIS To diagnose a UTI, your caregiver will ask you about your symptoms. Your caregiver will also ask you to provide a urine sample. The urine sample will be tested for bacteria and white blood cells. White blood cells are made by your body to help fight infection. TREATMENT  Typically, UTIs can be treated with medication. Because most UTIs are caused by a bacterial infection, they usually can be treated with the use of antibiotics. The choice of antibiotic and length of treatment depend on your symptoms and the type of bacteria causing your infection. HOME CARE INSTRUCTIONS  If you were prescribed antibiotics, take them exactly as your caregiver instructs you. Finish the medication even if you feel better after you have only taken some of the medication.  Drink enough water and fluids to keep your urine clear or pale yellow.  Avoid caffeine, tea, and carbonated beverages. They tend to  irritate your bladder.  Empty your bladder often. Avoid holding urine for long periods of time.  Empty your bladder before and after sexual intercourse.  After a bowel movement, women should cleanse from front to back. Use each tissue only once. SEEK MEDICAL CARE IF:   You have back pain.  You develop a fever.  Your symptoms do not begin to resolve within 3 days. SEEK IMMEDIATE MEDICAL CARE IF:   You have severe back pain or lower abdominal pain.  You develop chills.  You have nausea or vomiting.  You have continued burning or discomfort with urination. MAKE SURE YOU:   Understand these  instructions.  Will watch your condition.  Will get help right away if you are not doing well or get worse.   This information is not intended to replace advice given to you by your health care provider. Make sure you discuss any questions you have with your health care provider.   Document Released: 02/05/2005 Document Revised: 01/17/2015 Document Reviewed: 06/06/2011 Elsevier Interactive Patient Education [employer].

## 2015-08-17 NOTE — ED Notes (Signed)
Spoke with Tammy @ Phlebotomy to come draw labs.

## 2015-08-17 NOTE — ED Notes (Signed)
CBG 371, MD aware

## 2015-08-17 NOTE — ED Provider Notes (Signed)
CSN: [id number]     Arrival date & time 08/17/15  1659 History   First MD Initiated Contact with Patient 08/17/15 1705     Chief Complaint  Patient presents with  . Hyperglycemia     (Consider location/radiation/quality/duration/timing/severity/associated sxs/prior Treatment) HPI Comments: Patient is a 75 year old female with history of insulin-dependent diabetes, hypertension. She presents for evaluation of weakness and generalized malaise for the past 3 days. She tells me she was seen by her primary doctor on Monday and had several medication changes. She tells me she was called by her primary doctor on Wednesday and told that her "vital signs were low". She denies any chest pain or shortness of breath. She denies any fevers or chills. She attempted to check her blood pressure at home, however she could not get her machine to register.  Patient is a 75 y.o. female presenting with hyperglycemia. The history is provided by the patient.  Hyperglycemia Severity:  Moderate Onset quality:  Gradual Duration:  3 days Timing:  Constant Progression:  Worsening Chronicity:  New Diabetes status:  Controlled with insulin Context: change in medication   Relieved by:  Nothing Ineffective treatments:  None tried Associated symptoms: fatigue   Associated symptoms: no abdominal pain, no dysuria, no fever and no shortness of breath     Past Medical History  Diagnosis Date  . Diabetes mellitus without complication ([place])   . Hypertension   . 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 Tubal ligation     Family History  Problem Relation Age of Onset  . Cancer Mother   . Diabetes Father    Social History  Substance Use Topics  . Smoking status: Former Research scientist (life sciences)  . Smokeless tobacco: None  . Alcohol Use: No   OB History    No data available     Review of Systems  Constitutional: Positive for fatigue. Negative for fever.  Respiratory: Negative  for shortness of breath.   Gastrointestinal: Negative for abdominal pain.  Genitourinary: Negative for dysuria.  All other systems reviewed and are negative.     Allergies  Rosiglitazone maleate; Metformin; Penicillins; and Pioglitazone  Home Medications   Prior to Admission medications   Medication Sig Start Date End Date Taking? Authorizing Provider  alendronate (FOSAMAX) 70 MG tablet Take 70 mg by mouth every 7 (seven) days. Take with a full glass of water on an empty stomach.    Historical Provider, MD  benazepril (LOTENSIN) 40 MG tablet Take 40 mg by mouth daily.    Historical Provider, MD  cephALEXin (KEFLEX) 500 MG capsule Take 1 capsule (500 mg total) by mouth 4 (four) times daily. 07/01/14   Margarita Mail, PA-C  cholecalciferol (VITAMIN D) 1000 UNITS tablet Take 1,000 Units by mouth daily.     Historical Provider, MD  diltiazem (DILACOR XR) 180 MG 24 hr capsule Take 180 mg by mouth daily.    Historical Provider, MD  glimepiride (AMARYL) 2 MG tablet Take 2 mg by mouth 2 (two) times daily.    Historical Provider, MD  insulin lispro protamine-lispro (HUMALOG 75/25 MIX) (75-25) 100 UNIT/ML SUSP injection Inject 25-32 Units into the skin 2 (two) times daily with a meal. Takes 32 units in the morning and 25 units at night    Historical Provider, MD  naphazoline-glycerin (CLEAR EYES) 0.012-0.2 % SOLN Place 1-2 drops into both eyes every 4 (four) hours as needed for irritation.    Historical Provider, MD  naproxen  sodium (ALEVE) 220 MG tablet Take 440 mg by mouth at bedtime as needed (for pain).    Historical Provider, MD  Omega-3 Fatty Acids (FISH OIL PO) Take 1 capsule by mouth daily.    Historical Provider, MD  simvastatin (ZOCOR) 20 MG tablet Take 20 mg by mouth daily.    Historical Provider, MD  sulfamethoxazole-trimethoprim (SEPTRA DS) 800-160 MG per tablet Take 1 tablet by mouth every 12 (twelve) hours. Patient not taking: Reported on 07/01/2014 12/04/13   Carlisle Cater, PA-C   BP  155/84 mmHg  Pulse 82  Temp(Src) 97.7 F (36.5 C) (Oral)  Resp 18  SpO2 98% Physical Exam  Constitutional: She is oriented to person, place, and time. She appears well-developed and well-nourished. No distress.  HENT:  Head: Normocephalic and atraumatic.  Mouth/Throat: Oropharynx is clear and moist.  Eyes: EOM are normal. Pupils are equal, round, and reactive to light.  Neck: Normal range of motion. Neck supple.  Cardiovascular: Normal rate and regular rhythm.  Exam reveals no gallop and no friction rub.   No murmur heard. Pulmonary/Chest: Effort normal and breath sounds normal. No respiratory distress. She has no wheezes.  Abdominal: Soft. Bowel sounds are normal. She exhibits no distension. There is no tenderness.  Musculoskeletal: Normal range of motion. She exhibits no edema.  Neurological: She is alert and oriented to person, place, and time. No cranial nerve deficit. She exhibits normal muscle tone. Coordination normal.  Skin: Skin is warm and dry. She is not diaphoretic.  Nursing note and vitals reviewed.   ED Course  Procedures (including critical care time) Labs Review Labs Reviewed  CBG MONITORING, ED - Abnormal; Notable for the following:    Glucose-Capillary 371 (*)    All other components within normal limits  CBC WITH DIFFERENTIAL/PLATELET  URINALYSIS, ROUTINE W REFLEX MICROSCOPIC (NOT AT [facility])  BASIC METABOLIC PANEL    Imaging Review No results found. I have personally reviewed and evaluated these images and lab results as part of my medical decision-making.   EKG Interpretation   Date/Time:  Friday August 17 2015 18:34:15 EDT Ventricular Rate:  81 PR Interval:  173 QRS Duration: 104 QT Interval:  409 QTC Calculation: 475 R Axis:   44 Text Interpretation:  Sinus rhythm Normal ECG Confirmed by Codey Burling  MD,  Anise Harbin ([id number]) on 08/17/2015 9:03:38 PM      MDM   Final diagnoses:  None    Patient presents with complaints of weakness, elevated blood  sugar. Her workup reveals an elevated blood sugar which was treated with insulin and fluids and responded. She also appears slightly dehydrated based on her electrolytes. There is also evidence for a urinary tract infection. Otherwise, patient appears well and I see no indication for admission. She will be discharged with antibiotics and advised to keep a record of her blood sugars which he can follow-up with her primary Dr. to discuss medication adjustment.    Veryl Speak, MD 08/17/15 2104

## 2015-08-17 NOTE — ED Notes (Signed)
Patient transported to X-ray 

## 2015-08-17 NOTE — ED Notes (Signed)
"  My doctor called me Wednesday and said my vital signs was low. Then I went to check my sugar and it just wouldn't read, it didn't say 'high' or 'low,' so I don't know what's going on." No new c/o pain, just states her "blood sugar machine" wasn't checking her sugar appropriately. Vitals stable in triage.

## 2015-10-12 DIAGNOSIS — N183 Chronic kidney disease, stage 3 (moderate): Secondary | ICD-10-CM | POA: Diagnosis not present

## 2015-10-12 DIAGNOSIS — E7800 Pure hypercholesterolemia, unspecified: Secondary | ICD-10-CM | POA: Diagnosis not present

## 2015-10-12 DIAGNOSIS — J309 Allergic rhinitis, unspecified: Secondary | ICD-10-CM | POA: Diagnosis not present

## 2015-10-12 DIAGNOSIS — I7090 Unspecified atherosclerosis: Secondary | ICD-10-CM | POA: Diagnosis not present

## 2015-10-12 DIAGNOSIS — E1121 Type 2 diabetes mellitus with diabetic nephropathy: Secondary | ICD-10-CM | POA: Diagnosis not present

## 2015-11-28 DIAGNOSIS — N183 Chronic kidney disease, stage 3 (moderate): Secondary | ICD-10-CM | POA: Diagnosis not present

## 2015-11-28 DIAGNOSIS — N390 Urinary tract infection, site not specified: Secondary | ICD-10-CM | POA: Diagnosis not present

## 2015-11-28 DIAGNOSIS — I10 Essential (primary) hypertension: Secondary | ICD-10-CM | POA: Diagnosis not present

## 2015-11-28 DIAGNOSIS — E1122 Type 2 diabetes mellitus with diabetic chronic kidney disease: Secondary | ICD-10-CM | POA: Diagnosis not present

## 2015-11-29 ENCOUNTER — Other Ambulatory Visit: Payer: Self-pay | Admitting: Nephrology

## 2015-11-29 DIAGNOSIS — N183 Chronic kidney disease, stage 3 unspecified: Secondary | ICD-10-CM

## 2015-12-03 ENCOUNTER — Ambulatory Visit
Admission: RE | Admit: 2015-12-03 | Discharge: 2015-12-03 | Disposition: A | Discharge status: Home / Self Care | Payer: Medicare Other | Source: Ambulatory Visit | Attending: Nephrology | Admitting: Nephrology

## 2015-12-03 DIAGNOSIS — N189 Chronic kidney disease, unspecified: Secondary | ICD-10-CM | POA: Diagnosis not present

## 2015-12-03 DIAGNOSIS — N183 Chronic kidney disease, stage 3 unspecified: Secondary | ICD-10-CM

## 2015-12-12 DIAGNOSIS — E1142 Type 2 diabetes mellitus with diabetic polyneuropathy: Secondary | ICD-10-CM | POA: Diagnosis not present

## 2015-12-12 DIAGNOSIS — R197 Diarrhea, unspecified: Secondary | ICD-10-CM | POA: Diagnosis not present

## 2015-12-12 DIAGNOSIS — R6889 Other general symptoms and signs: Secondary | ICD-10-CM | POA: Diagnosis not present

## 2015-12-12 DIAGNOSIS — E1165 Type 2 diabetes mellitus with hyperglycemia: Secondary | ICD-10-CM | POA: Diagnosis not present

## 2015-12-12 DIAGNOSIS — N289 Disorder of kidney and ureter, unspecified: Secondary | ICD-10-CM | POA: Diagnosis not present

## 2015-12-12 DIAGNOSIS — Z794 Long term (current) use of insulin: Secondary | ICD-10-CM | POA: Diagnosis not present

## 2015-12-17 DIAGNOSIS — E559 Vitamin D deficiency, unspecified: Secondary | ICD-10-CM | POA: Diagnosis not present

## 2015-12-17 DIAGNOSIS — N183 Chronic kidney disease, stage 3 (moderate): Secondary | ICD-10-CM | POA: Diagnosis not present

## 2015-12-17 DIAGNOSIS — E7800 Pure hypercholesterolemia, unspecified: Secondary | ICD-10-CM | POA: Diagnosis not present

## 2015-12-17 DIAGNOSIS — E1121 Type 2 diabetes mellitus with diabetic nephropathy: Secondary | ICD-10-CM | POA: Diagnosis not present

## 2016-01-08 DIAGNOSIS — E1122 Type 2 diabetes mellitus with diabetic chronic kidney disease: Secondary | ICD-10-CM | POA: Diagnosis not present

## 2016-01-08 DIAGNOSIS — I10 Essential (primary) hypertension: Secondary | ICD-10-CM | POA: Diagnosis not present

## 2016-01-08 DIAGNOSIS — N183 Chronic kidney disease, stage 3 (moderate): Secondary | ICD-10-CM | POA: Diagnosis not present

## 2016-01-09 ENCOUNTER — Other Ambulatory Visit: Payer: Self-pay | Admitting: Pharmacist

## 2016-01-09 NOTE — Patient Outreach (Signed)
Outreach call to [name] regarding her request for follow up from the [facility] Medication Adherence Campaign. Called and spoke with patient. HIPAA identifiers verified and verbal consent received.  Ms. Norwick reports that she changed primary care practices a few months ago and is now going to [facility]. Reports that most of her medications were changed when she switched practices. Review patient's current medications with her and update her medication list in EPIC accordingly.   Patient reports that she has been taking her atorvastatin 40 mg once daily as directed. Denies any missed doses or any barriers to taking her medications such as cost or side effects. Reports that she used to have trouble with affording her medications, but that she now has the extra help with the cost of her medications. Let patient know about using [employer] order for further cost savings and convenience. Provide patient with the phone number for [facility] from the website.  Patient reports that she has no medication questions or concerns at this time. Provide patient with my phone number.  Harlow Asa, PharmD Clinical Pharmacist [facility] [phone]

## 2016-01-28 DIAGNOSIS — Z794 Long term (current) use of insulin: Secondary | ICD-10-CM | POA: Diagnosis not present

## 2016-01-28 DIAGNOSIS — H8110 Benign paroxysmal vertigo, unspecified ear: Secondary | ICD-10-CM | POA: Diagnosis not present

## 2016-01-28 DIAGNOSIS — E1142 Type 2 diabetes mellitus with diabetic polyneuropathy: Secondary | ICD-10-CM | POA: Diagnosis not present

## 2016-01-28 DIAGNOSIS — N289 Disorder of kidney and ureter, unspecified: Secondary | ICD-10-CM | POA: Diagnosis not present

## 2016-01-28 DIAGNOSIS — E1165 Type 2 diabetes mellitus with hyperglycemia: Secondary | ICD-10-CM | POA: Diagnosis not present

## 2016-01-28 DIAGNOSIS — Z23 Encounter for immunization: Secondary | ICD-10-CM | POA: Diagnosis not present

## 2016-01-30 DIAGNOSIS — H8110 Benign paroxysmal vertigo, unspecified ear: Secondary | ICD-10-CM | POA: Diagnosis not present

## 2016-01-30 DIAGNOSIS — E1165 Type 2 diabetes mellitus with hyperglycemia: Secondary | ICD-10-CM | POA: Diagnosis not present

## 2016-02-14 DIAGNOSIS — E103413 Type 1 diabetes mellitus with severe nonproliferative diabetic retinopathy with macular edema, bilateral: Secondary | ICD-10-CM | POA: Diagnosis not present

## 2016-02-14 DIAGNOSIS — Z135 Encounter for screening for eye and ear disorders: Secondary | ICD-10-CM | POA: Diagnosis not present

## 2016-02-14 DIAGNOSIS — H5203 Hypermetropia, bilateral: Secondary | ICD-10-CM | POA: Diagnosis not present

## 2016-02-14 DIAGNOSIS — H52223 Regular astigmatism, bilateral: Secondary | ICD-10-CM | POA: Diagnosis not present

## 2016-03-04 DIAGNOSIS — E1142 Type 2 diabetes mellitus with diabetic polyneuropathy: Secondary | ICD-10-CM | POA: Diagnosis not present

## 2016-03-04 DIAGNOSIS — E1165 Type 2 diabetes mellitus with hyperglycemia: Secondary | ICD-10-CM | POA: Diagnosis not present

## 2016-03-04 DIAGNOSIS — N289 Disorder of kidney and ureter, unspecified: Secondary | ICD-10-CM | POA: Diagnosis not present

## 2016-03-04 DIAGNOSIS — Z794 Long term (current) use of insulin: Secondary | ICD-10-CM | POA: Diagnosis not present

## 2016-04-07 DIAGNOSIS — E1122 Type 2 diabetes mellitus with diabetic chronic kidney disease: Secondary | ICD-10-CM | POA: Diagnosis not present

## 2016-04-07 DIAGNOSIS — I10 Essential (primary) hypertension: Secondary | ICD-10-CM | POA: Diagnosis not present

## 2016-04-07 DIAGNOSIS — N183 Chronic kidney disease, stage 3 (moderate): Secondary | ICD-10-CM | POA: Diagnosis not present

## 2016-05-07 DIAGNOSIS — E162 Hypoglycemia, unspecified: Secondary | ICD-10-CM | POA: Diagnosis not present

## 2016-05-07 DIAGNOSIS — E1142 Type 2 diabetes mellitus with diabetic polyneuropathy: Secondary | ICD-10-CM | POA: Diagnosis not present

## 2016-05-07 DIAGNOSIS — N289 Disorder of kidney and ureter, unspecified: Secondary | ICD-10-CM | POA: Diagnosis not present

## 2016-05-07 DIAGNOSIS — E1165 Type 2 diabetes mellitus with hyperglycemia: Secondary | ICD-10-CM | POA: Diagnosis not present

## 2016-05-07 DIAGNOSIS — Z794 Long term (current) use of insulin: Secondary | ICD-10-CM | POA: Diagnosis not present

## 2016-05-22 ENCOUNTER — Emergency Department (HOSPITAL_COMMUNITY): Payer: Medicare Other

## 2016-05-22 ENCOUNTER — Encounter (HOSPITAL_COMMUNITY): Payer: Self-pay

## 2016-05-22 ENCOUNTER — Inpatient Hospital Stay (HOSPITAL_COMMUNITY): Payer: Medicare Other

## 2016-05-22 ENCOUNTER — Inpatient Hospital Stay (HOSPITAL_COMMUNITY)
Admission: EM | Admit: 2016-05-22 | Discharge: 2016-05-30 | DRG: 374 | Disposition: A | Discharge status: Home / Self Care | Payer: Medicare Other | Attending: Internal Medicine | Admitting: Internal Medicine

## 2016-05-22 DIAGNOSIS — K838 Other specified diseases of biliary tract: Secondary | ICD-10-CM | POA: Diagnosis not present

## 2016-05-22 DIAGNOSIS — Z800 Family history of malignant neoplasm of digestive organs: Secondary | ICD-10-CM

## 2016-05-22 DIAGNOSIS — E1165 Type 2 diabetes mellitus with hyperglycemia: Secondary | ICD-10-CM | POA: Diagnosis present

## 2016-05-22 DIAGNOSIS — K7589 Other specified inflammatory liver diseases: Secondary | ICD-10-CM

## 2016-05-22 DIAGNOSIS — R932 Abnormal findings on diagnostic imaging of liver and biliary tract: Secondary | ICD-10-CM | POA: Diagnosis not present

## 2016-05-22 DIAGNOSIS — R7989 Other specified abnormal findings of blood chemistry: Secondary | ICD-10-CM | POA: Diagnosis not present

## 2016-05-22 DIAGNOSIS — Z794 Long term (current) use of insulin: Secondary | ICD-10-CM

## 2016-05-22 DIAGNOSIS — R933 Abnormal findings on diagnostic imaging of other parts of digestive tract: Secondary | ICD-10-CM | POA: Diagnosis not present

## 2016-05-22 DIAGNOSIS — R748 Abnormal levels of other serum enzymes: Secondary | ICD-10-CM

## 2016-05-22 DIAGNOSIS — Z79899 Other long term (current) drug therapy: Secondary | ICD-10-CM | POA: Diagnosis not present

## 2016-05-22 DIAGNOSIS — Z880 Allergy status to penicillin: Secondary | ICD-10-CM | POA: Diagnosis not present

## 2016-05-22 DIAGNOSIS — E876 Hypokalemia: Secondary | ICD-10-CM | POA: Diagnosis present

## 2016-05-22 DIAGNOSIS — Z888 Allergy status to other drugs, medicaments and biological substances status: Secondary | ICD-10-CM

## 2016-05-22 DIAGNOSIS — C786 Secondary malignant neoplasm of retroperitoneum and peritoneum: Secondary | ICD-10-CM | POA: Diagnosis not present

## 2016-05-22 DIAGNOSIS — Z7982 Long term (current) use of aspirin: Secondary | ICD-10-CM | POA: Diagnosis not present

## 2016-05-22 DIAGNOSIS — I10 Essential (primary) hypertension: Secondary | ICD-10-CM | POA: Diagnosis present

## 2016-05-22 DIAGNOSIS — R1900 Intra-abdominal and pelvic swelling, mass and lump, unspecified site: Secondary | ICD-10-CM | POA: Diagnosis not present

## 2016-05-22 DIAGNOSIS — R1310 Dysphagia, unspecified: Secondary | ICD-10-CM | POA: Diagnosis not present

## 2016-05-22 DIAGNOSIS — K828 Other specified diseases of gallbladder: Secondary | ICD-10-CM

## 2016-05-22 DIAGNOSIS — Z803 Family history of malignant neoplasm of breast: Secondary | ICD-10-CM

## 2016-05-22 DIAGNOSIS — R634 Abnormal weight loss: Secondary | ICD-10-CM

## 2016-05-22 DIAGNOSIS — K7689 Other specified diseases of liver: Secondary | ICD-10-CM

## 2016-05-22 DIAGNOSIS — R945 Abnormal results of liver function studies: Secondary | ICD-10-CM

## 2016-05-22 DIAGNOSIS — C801 Malignant (primary) neoplasm, unspecified: Secondary | ICD-10-CM

## 2016-05-22 DIAGNOSIS — Z87891 Personal history of nicotine dependence: Secondary | ICD-10-CM

## 2016-05-22 DIAGNOSIS — D649 Anemia, unspecified: Secondary | ICD-10-CM | POA: Diagnosis not present

## 2016-05-22 DIAGNOSIS — Z833 Family history of diabetes mellitus: Secondary | ICD-10-CM

## 2016-05-22 DIAGNOSIS — R630 Anorexia: Secondary | ICD-10-CM

## 2016-05-22 DIAGNOSIS — R160 Hepatomegaly, not elsewhere classified: Secondary | ICD-10-CM

## 2016-05-22 DIAGNOSIS — C799 Secondary malignant neoplasm of unspecified site: Secondary | ICD-10-CM

## 2016-05-22 DIAGNOSIS — K8080 Other cholelithiasis without obstruction: Secondary | ICD-10-CM | POA: Diagnosis present

## 2016-05-22 DIAGNOSIS — E1122 Type 2 diabetes mellitus with diabetic chronic kidney disease: Secondary | ICD-10-CM | POA: Diagnosis not present

## 2016-05-22 DIAGNOSIS — N390 Urinary tract infection, site not specified: Secondary | ICD-10-CM | POA: Diagnosis not present

## 2016-05-22 DIAGNOSIS — I129 Hypertensive chronic kidney disease with stage 1 through stage 4 chronic kidney disease, or unspecified chronic kidney disease: Secondary | ICD-10-CM | POA: Diagnosis present

## 2016-05-22 DIAGNOSIS — E119 Type 2 diabetes mellitus without complications: Secondary | ICD-10-CM | POA: Insufficient documentation

## 2016-05-22 DIAGNOSIS — C221 Intrahepatic bile duct carcinoma: Secondary | ICD-10-CM | POA: Diagnosis present

## 2016-05-22 DIAGNOSIS — N939 Abnormal uterine and vaginal bleeding, unspecified: Secondary | ICD-10-CM

## 2016-05-22 DIAGNOSIS — K831 Obstruction of bile duct: Secondary | ICD-10-CM

## 2016-05-22 DIAGNOSIS — R17 Unspecified jaundice: Secondary | ICD-10-CM | POA: Diagnosis not present

## 2016-05-22 DIAGNOSIS — N183 Chronic kidney disease, stage 3 unspecified: Secondary | ICD-10-CM

## 2016-05-22 DIAGNOSIS — IMO0002 Reserved for concepts with insufficient information to code with codable children: Secondary | ICD-10-CM | POA: Diagnosis present

## 2016-05-22 DIAGNOSIS — R935 Abnormal findings on diagnostic imaging of other abdominal regions, including retroperitoneum: Secondary | ICD-10-CM | POA: Diagnosis not present

## 2016-05-22 DIAGNOSIS — C481 Malignant neoplasm of specified parts of peritoneum: Secondary | ICD-10-CM | POA: Diagnosis not present

## 2016-05-22 DIAGNOSIS — N189 Chronic kidney disease, unspecified: Secondary | ICD-10-CM | POA: Diagnosis not present

## 2016-05-22 DIAGNOSIS — K8020 Calculus of gallbladder without cholecystitis without obstruction: Secondary | ICD-10-CM | POA: Diagnosis not present

## 2016-05-22 LAB — CBC
HEMATOCRIT: 34 % — AB (ref 36.0–46.0)
HEMOGLOBIN: 11.7 g/dL — AB (ref 12.0–15.0)
MCH: 26.7 pg (ref 26.0–34.0)
MCHC: 34.4 g/dL (ref 30.0–36.0)
MCV: 77.6 fL — ABNORMAL LOW (ref 78.0–100.0)
Platelets: 388 10*3/uL (ref 150–400)
RBC: 4.38 MIL/uL (ref 3.87–5.11)
RDW: 16.7 % — ABNORMAL HIGH (ref 11.5–15.5)
WBC: 11.1 10*3/uL — ABNORMAL HIGH (ref 4.0–10.5)

## 2016-05-22 LAB — URINALYSIS, ROUTINE W REFLEX MICROSCOPIC
Glucose, UA: 150 mg/dL — AB
KETONES UR: NEGATIVE mg/dL
Nitrite: NEGATIVE
PROTEIN: 100 mg/dL — AB
Specific Gravity, Urine: 1.012 (ref 1.005–1.030)
pH: 5 (ref 5.0–8.0)

## 2016-05-22 LAB — COMPREHENSIVE METABOLIC PANEL
ALBUMIN: 3.5 g/dL (ref 3.5–5.0)
ALK PHOS: 623 U/L — AB (ref 38–126)
ALT: 213 U/L — ABNORMAL HIGH (ref 14–54)
ANION GAP: 13 (ref 5–15)
AST: 177 U/L — ABNORMAL HIGH (ref 15–41)
BUN: 29 mg/dL — ABNORMAL HIGH (ref 6–20)
CALCIUM: 9.4 mg/dL (ref 8.9–10.3)
CO2: 20 mmol/L — AB (ref 22–32)
Chloride: 101 mmol/L (ref 101–111)
Creatinine, Ser: 1.63 mg/dL — ABNORMAL HIGH (ref 0.44–1.00)
GFR calc Af Amer: 34 mL/min — ABNORMAL LOW (ref >60)
GFR calc non Af Amer: 30 mL/min — ABNORMAL LOW (ref >60)
GLUCOSE: 340 mg/dL — AB (ref 65–99)
Potassium: 3.9 mmol/L (ref 3.5–5.1)
SODIUM: 134 mmol/L — AB (ref 135–145)
Total Bilirubin: 7.6 mg/dL — ABNORMAL HIGH (ref 0.3–1.2)
Total Protein: 9.1 g/dL — ABNORMAL HIGH (ref 6.5–8.1)

## 2016-05-22 LAB — RAPID URINE DRUG SCREEN, HOSP PERFORMED
AMPHETAMINES: NOT DETECTED
Barbiturates: NOT DETECTED
Benzodiazepines: NOT DETECTED
COCAINE: NOT DETECTED
OPIATES: NOT DETECTED
TETRAHYDROCANNABINOL: NOT DETECTED

## 2016-05-22 LAB — ETHANOL

## 2016-05-22 LAB — CBG MONITORING, ED: GLUCOSE-CAPILLARY: 319 mg/dL — AB (ref 65–99)

## 2016-05-22 LAB — SALICYLATE LEVEL: Salicylate Lvl: <7 mg/dL (ref 2.8–30.0)

## 2016-05-22 LAB — ACETAMINOPHEN LEVEL: Acetaminophen (Tylenol), Serum: <10 ug/mL — ABNORMAL LOW (ref 10–30)

## 2016-05-22 MED ORDER — CIPROFLOXACIN HCL 500 MG PO TABS
500.0000 mg | ORAL_TABLET | Freq: Once | ORAL | Status: AC
  Administered 2016-05-22: 500 mg via ORAL
  Filled 2016-05-22: qty 1

## 2016-05-22 MED ORDER — SODIUM CHLORIDE 0.9 % IV SOLN
INTRAVENOUS | Status: DC
  Administered 2016-05-22 – 2016-05-28 (×8): via INTRAVENOUS

## 2016-05-22 MED ORDER — METRONIDAZOLE IN NACL 5-0.79 MG/ML-% IV SOLN
500.0000 mg | Freq: Once | INTRAVENOUS | Status: AC
  Administered 2016-05-22: 500 mg via INTRAVENOUS
  Filled 2016-05-22: qty 100

## 2016-05-22 NOTE — ED Triage Notes (Signed)
Patient c/o vaginal bleeding x 1 weeks. Patient denies abdominal pain or bloating.

## 2016-05-22 NOTE — ED Notes (Signed)
US at bedside

## 2016-05-22 NOTE — H&P (Addendum)
History and Physical    MONASIA LAIR ZOX:[id number] DOB: 05/17/1940 DOA: 05/22/2016  PCP: Maximino Greenland, MD   Patient coming from: Home  Chief Complaint: Abnormal vaginal bleeding, dysuria, loss of appetite  HPI: [name] is a 76 y.o. woman with a history of HTN, DM, and CKD 3 who presents to the ED with multiple complaints, including abnormal vaginal bleeding, dysuria (with possible hematuria), and rectal pain with bowel movements (though she denies constipation or the need to strain).  She has not seen any blood in her stool.  She denies associated fever.  No abdominal pain.  No nausea or vomiting, but she reports decreased appetite and 5 lb weight loss since Christmas.  No headache.  No chest pain or shortness of breath.  She has had increased weakness.  ED Course: U/A concerning for infection and the patient received oral cipro.  She has a mild leukocytosis and slightly decreased sodium, but renal function appears to be at baseline.  ED attending was actually anticipating discharge until her hepatic function panel came back showing markedly abnormal LFTs.  ALT 213, AST 177, Alk Phos 623, Total Bilirubin 7.6.  RUQ ultrasound ordered which showed large gallbladder stone with sludge and mild wall thickening.  No biliary dilatation.  No sonographic Murphy's sign.  GI and General Surgery consults have been called from the ED.  IV flagyl and IV fluids added.  Hospitalist asked to admit.  Review of Systems: Intermittent diarrhea after eating certain foods.  Otherwise, 10 systems reviewed and negative except as stated in the HPI.   Past Medical History:  Diagnosis Date  . Diabetes mellitus without complication ([place])   . Hypertension   . 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 CYSTECTOMY    . TUBAL LIGATION       reports that she has quit smoking. She has never used smokeless tobacco. She reports that she does not drink  alcohol or use drugs.  She is married.  She had six children.  Allergies  Allergen Reactions  . Rosiglitazone Maleate Other (See Comments)    couldn't sleep  . Metformin Diarrhea  . Penicillins Hives and Rash    Has patient had a PCN reaction causing immediate rash, facial/tongue/throat swelling, SOB or lightheadedness with hypotension: yes Has patient had a PCN reaction causing severe rash involving mucus membranes or skin necrosis: no Has patient had a PCN reaction that required hospitalization:no Has patient had a PCN reaction occurring within the last 10 years: no If all of the above answers are "NO", then may proceed with Cephalosporin use.  . Pioglitazone Other (See Comments)    Joint pain     Family History  Problem Relation Age of Onset  . Cancer Mother   . Diabetes Father      Prior to Admission medications   Medication Sig Start Date End Date Taking? Authorizing Provider  aspirin EC 81 MG tablet Take 81 mg by mouth daily.   Yes Historical Provider, MD  cholecalciferol (VITAMIN D) 1000 UNITS tablet Take 1,000 Units by mouth daily.    Yes Historical Provider, MD  furosemide (LASIX) 40 MG tablet Take 40 mg by mouth daily.    Yes Historical Provider, MD  HUMALOG MIX 50/50 KWIKPEN (50-50) 100 UNIT/ML Kwikpen Inject 10-40 Units into the skin 2 (two) times daily. Inject 40 units under the skin before breakfast and 10 units under the skin before evening meal. 05/13/16  Yes Historical Provider, MD  Multiple Vitamin (MULTIVITAMIN WITH MINERALS) TABS tablet Take 1 tablet by mouth daily.   Yes Historical Provider, MD  Olmesartan-Amlodipine-HCTZ 40-10-12.5 MG TABS Take 1 tablet by mouth daily. 04/14/16  Yes Historical Provider, MD  Omega-3 Fatty Acids (FISH OIL PO) Take 1 capsule by mouth daily.   Yes Historical Provider, MD    Physical Exam: Vitals:   05/22/16 1737 05/22/16 2002 05/22/16 2145 05/22/16 2217  BP: 159/86 180/72  157/79  Pulse: 86 98  97  Resp: _0 Temp:    98  F (36.7 C)  TempSrc:    Oral  SpO2: 99% 99%  99%  Weight:   78.5 kg (173 lb)   Height:          Constitutional: NAD, calm, comfortable, NONtoxic appearing Vitals:   05/22/16 1737 05/22/16 2002 05/22/16 2145 05/22/16 2217  BP: 159/86 180/72  157/79  Pulse: 86 98  97  Resp: _1 Temp:    98 F (36.7 C)  TempSrc:    Oral  SpO2: 99% 99%  99%  Weight:   78.5 kg (173 lb)   Height:       Eyes: PERRL, lids and conjunctivae normal ENMT: Mucous membranes are dry. Posterior pharynx clear of any exudate or lesions. Normal dentition.  Neck: normal appearance, supple Respiratory: clear to auscultation bilaterally, no wheezing, no crackles. Normal respiratory effort. No accessory muscle use.  Cardiovascular: Normal rate, regular rhythm, no murmurs / rubs / gallops. No extremity edema. 2+ pedal pulses. GI: abdomen is soft and compressible.  No distention.  No tenderness.  No masses palpated.  Bowel sounds are present. Musculoskeletal:  No joint deformity in upper and lower extremities. Good ROM, no contractures. Normal muscle tone.  Skin: no rashes, warm and dry Neurologic: CN 2-12 grossly intact. Sensation intact, Strength symmetric bilaterally, 5/5. Psychiatric: Normal judgment and insight. Alert and oriented x 3.  Flat affect.    Labs on Admission: I have personally reviewed following labs and imaging studies  CBC:  Recent Labs Lab 05/22/16 1736  WBC 11.1*  HGB 11.7*  HCT 34.0*  MCV 77.6*  PLT 378   Basic Metabolic Panel:  Recent Labs Lab 05/22/16 1736  NA 134*  K 3.9  CL 101  CO2 20*  GLUCOSE 340*  BUN 29*  CREATININE 1.63*  CALCIUM 9.4   GFR: Estimated Creatinine Clearance: 32.2 mL/min (by C-G formula based on SCr of 1.63 mg/dL (H)). Liver Function Tests:  Recent Labs Lab 05/22/16 1736  AST 177*  ALT 213*  ALKPHOS 623*  BILITOT 7.6*  PROT 9.1*  ALBUMIN 3.5   CBG:  Recent Labs Lab 05/22/16 1730  GLUCAP 319*   Urine analysis:      Component Value Date/Time   COLORURINE AMBER (A) 05/22/2016 1700   APPEARANCEUR CLOUDY (A) 05/22/2016 1700   LABSPEC 1.012 05/22/2016 1700   PHURINE 5.0 05/22/2016 1700   GLUCOSEU 150 (A) 05/22/2016 1700   HGBUR LARGE (A) 05/22/2016 1700   BILIRUBINUR SMALL (A) 05/22/2016 1700   KETONESUR NEGATIVE 05/22/2016 1700   PROTEINUR 100 (A) 05/22/2016 1700   UROBILINOGEN 0.2 12/03/2013 2155   NITRITE NEGATIVE 05/22/2016 1700   LEUKOCYTESUR LARGE (A) 05/22/2016 1700    Radiological Exams on Admission: US Abdomen Complete  Result Date: 05/22/2016 CLINICAL DATA:  Elevated LFTs, UTI EXAM: ABDOMEN ULTRASOUND COMPLETE COMPARISON:  12/03/2015 FINDINGS: Gallbladder: Large shadowing stone in the gallbladder measuring 2.6 cm. Mild wall thickening  at 4.3 mm. Negative sonographic Murphy's. Sludge is also present within the gallbladder. Common bile duct: Diameter: Normal at 3.5 mm. Liver: No focal lesion identified. Within normal limits in parenchymal echogenicity. IVC: No abnormality visualized. Pancreas: Visualized portion unremarkable. Spleen: Size and appearance within normal limits. Right Kidney: Length: 8.9 cm. Echogenicity within normal limits. No mass or hydronephrosis visualized. Lower pole obscured by bowel gas Left Kidney: Length: 10 cm. Echogenicity within normal limits. No mass or hydronephrosis visualized. Abdominal aorta: No aneurysm visualized. Other findings: None. IMPRESSION: 1. Hepatic echogenicity is within normal limits. No biliary dilatation 2. Large shadowing stone within the gallbladder with sludge also present. Mild wall thickening of the gallbladder is non specific. Negative sonographic Murphy's. Electronically Signed   By: Donavan Foil M.D.   On: 05/22/2016 20:08    EKG: Independently reviewed. NSR.  Assessment/Plan Principal Problem:   Abnormal LFTs Active Problems:   Essential hypertension   Hyperbilirubinemia   Weight loss   Loss of appetite   Diabetes (HCC)   CKD (chronic  kidney disease) stage 3, GFR 30-59 ml/min   Abnormal vaginal bleeding   UTI (urinary tract infection)      Abnormal LFTs with abnormal gallbladder appearance on CT.  She has had weight loss and loss of appetite; otherwise, asymptomatic. --NPO after midnight; anticipate MRCP in AM --GI consult pending --Gen surg to consult on potential need for gallbladder removal --Empiric levaquin and flagyl --negative EtOH, salicylate, and acetaminophen levels.  Acute hepatitis panel and HIV pending. --IV fluids --CT A/P without contrast (due to CKD 3) to look for obvious masses, lymphadenopathy  HTN --Continue home meds  DM --Use levemir for long acting coverage while admitted --SSI AC/HS  CKD 3 --Appears stable  Possible UTI --Levaquin should cover --Urine culture pending  Abnormal vaginal bleeding --Outpatient follow up  DVT prophylaxis: SCDs Code Status: FULL Family Communication: Patient's daughter at bedside in the ED at time of admission. Disposition Plan: To be determined. Consults called: [facility] ([place]); General Surgery ([place]) Admission status: Inpatient, med surg.  I expect this patient will need inpatient services for greater than two midnights.   TIME SPENT: 60 minutes   Eber Jones MD [facility] Pager [phone]  If 7PM-7AM, please contact night-coverage [email or web address] Password [facility]  05/22/2016, 10:36 PM

## 2016-05-23 ENCOUNTER — Inpatient Hospital Stay (HOSPITAL_COMMUNITY): Payer: Medicare Other

## 2016-05-23 DIAGNOSIS — N939 Abnormal uterine and vaginal bleeding, unspecified: Secondary | ICD-10-CM

## 2016-05-23 DIAGNOSIS — R630 Anorexia: Secondary | ICD-10-CM

## 2016-05-23 DIAGNOSIS — R1900 Intra-abdominal and pelvic swelling, mass and lump, unspecified site: Secondary | ICD-10-CM

## 2016-05-23 LAB — CBC
HCT: 29.6 % — ABNORMAL LOW (ref 36.0–46.0)
HEMOGLOBIN: 10.1 g/dL — AB (ref 12.0–15.0)
MCH: 25.9 pg — AB (ref 26.0–34.0)
MCHC: 34.1 g/dL (ref 30.0–36.0)
MCV: 75.9 fL — ABNORMAL LOW (ref 78.0–100.0)
Platelets: 395 10*3/uL (ref 150–400)
RBC: 3.9 MIL/uL (ref 3.87–5.11)
RDW: 16.8 % — AB (ref 11.5–15.5)
WBC: 9.9 10*3/uL (ref 4.0–10.5)

## 2016-05-23 LAB — BASIC METABOLIC PANEL
Anion gap: 10 (ref 5–15)
BUN: 30 mg/dL — AB (ref 6–20)
CALCIUM: 8.8 mg/dL — AB (ref 8.9–10.3)
CHLORIDE: 106 mmol/L (ref 101–111)
CO2: 20 mmol/L — ABNORMAL LOW (ref 22–32)
CREATININE: 1.4 mg/dL — AB (ref 0.44–1.00)
GFR calc Af Amer: 41 mL/min — ABNORMAL LOW (ref >60)
GFR calc non Af Amer: 36 mL/min — ABNORMAL LOW (ref >60)
Glucose, Bld: 399 mg/dL — ABNORMAL HIGH (ref 65–99)
Potassium: 4 mmol/L (ref 3.5–5.1)
SODIUM: 136 mmol/L (ref 135–145)

## 2016-05-23 LAB — GLUCOSE, CAPILLARY
GLUCOSE-CAPILLARY: 203 mg/dL — AB (ref 65–99)
GLUCOSE-CAPILLARY: 223 mg/dL — AB (ref 65–99)
Glucose-Capillary: 258 mg/dL — ABNORMAL HIGH (ref 65–99)

## 2016-05-23 LAB — PROTIME-INR
INR: 1.02
PROTHROMBIN TIME: 13.4 s (ref 11.4–15.2)

## 2016-05-23 LAB — CBG MONITORING, ED
GLUCOSE-CAPILLARY: 336 mg/dL — AB (ref 65–99)
Glucose-Capillary: 228 mg/dL — ABNORMAL HIGH (ref 65–99)
Glucose-Capillary: 348 mg/dL — ABNORMAL HIGH (ref 65–99)
Glucose-Capillary: 352 mg/dL — ABNORMAL HIGH (ref 65–99)

## 2016-05-23 MED ORDER — LEVOFLOXACIN IN D5W 750 MG/150ML IV SOLN
750.0000 mg | INTRAVENOUS | Status: DC
  Administered 2016-05-23 – 2016-05-29 (×4): 750 mg via INTRAVENOUS
  Filled 2016-05-23 (×4): qty 150

## 2016-05-23 MED ORDER — ONDANSETRON HCL 4 MG PO TABS: 4.0000 mg | ORAL_TABLET | Freq: Four times a day (QID) | ORAL | Status: DC | PRN

## 2016-05-23 MED ORDER — AMLODIPINE BESYLATE 10 MG PO TABS
10.0000 mg | ORAL_TABLET | Freq: Every day | ORAL | Status: DC
  Administered 2016-05-23 – 2016-05-30 (×8): 10 mg via ORAL
  Filled 2016-05-23 (×5): qty 1
  Filled 2016-05-23: qty 2
  Filled 2016-05-23 (×2): qty 1

## 2016-05-23 MED ORDER — ONDANSETRON HCL 4 MG/2ML IJ SOLN
4.0000 mg | Freq: Four times a day (QID) | INTRAMUSCULAR | Status: DC | PRN
  Administered 2016-05-23: 4 mg via INTRAVENOUS
  Filled 2016-05-23: qty 2

## 2016-05-23 MED ORDER — HYDROCHLOROTHIAZIDE 12.5 MG PO CAPS
12.5000 mg | ORAL_CAPSULE | Freq: Every day | ORAL | Status: DC
  Administered 2016-05-23 – 2016-05-30 (×8): 12.5 mg via ORAL
  Filled 2016-05-23 (×8): qty 1

## 2016-05-23 MED ORDER — OXYCODONE HCL 5 MG PO TABS
2.5000 mg | ORAL_TABLET | ORAL | Status: DC | PRN
Start: 2016-05-23 — End: 2016-05-30
  Administered 2016-05-23 – 2016-05-28 (×3): 2.5 mg via ORAL
  Filled 2016-05-23 (×3): qty 1

## 2016-05-23 MED ORDER — MORPHINE SULFATE (PF) 2 MG/ML IV SOLN
2.0000 mg | INTRAVENOUS | Status: DC | PRN
Start: 2016-05-23 — End: 2016-05-30

## 2016-05-23 MED ORDER — INSULIN ASPART 100 UNIT/ML ~~LOC~~ SOLN
0.0000 [IU] | Freq: Three times a day (TID) | SUBCUTANEOUS | Status: DC
Start: 2016-05-23 — End: 2016-05-30
  Administered 2016-05-23: 7 [IU] via SUBCUTANEOUS
  Administered 2016-05-23: 3 [IU] via SUBCUTANEOUS
  Administered 2016-05-23: 5 [IU] via SUBCUTANEOUS
  Administered 2016-05-24 – 2016-05-25 (×4): 2 [IU] via SUBCUTANEOUS
  Administered 2016-05-25: 5 [IU] via SUBCUTANEOUS
  Administered 2016-05-25: 3 [IU] via SUBCUTANEOUS
  Administered 2016-05-26 – 2016-05-27 (×2): 1 [IU] via SUBCUTANEOUS
  Administered 2016-05-27: 2 [IU] via SUBCUTANEOUS
  Administered 2016-05-28: 5 [IU] via SUBCUTANEOUS
  Administered 2016-05-28: 3 [IU] via SUBCUTANEOUS
  Administered 2016-05-29: 2 [IU] via SUBCUTANEOUS
  Administered 2016-05-29 (×2): 3 [IU] via SUBCUTANEOUS
  Filled 2016-05-23 (×2): qty 1

## 2016-05-23 MED ORDER — HYDRALAZINE HCL 20 MG/ML IJ SOLN
10.0000 mg | Freq: Four times a day (QID) | INTRAMUSCULAR | Status: DC | PRN
  Administered 2016-05-23 – 2016-05-28 (×4): 10 mg via INTRAVENOUS
  Filled 2016-05-23 (×4): qty 1

## 2016-05-23 MED ORDER — INSULIN ASPART 100 UNIT/ML ~~LOC~~ SOLN
0.0000 [IU] | Freq: Every day | SUBCUTANEOUS | Status: DC
  Administered 2016-05-23: 2 [IU] via SUBCUTANEOUS
  Administered 2016-05-23 – 2016-05-24 (×2): 4 [IU] via SUBCUTANEOUS
  Administered 2016-05-26: 5 [IU] via SUBCUTANEOUS
  Administered 2016-05-26: 2 [IU] via SUBCUTANEOUS
  Administered 2016-05-28: 4 [IU] via SUBCUTANEOUS
  Filled 2016-05-23: qty 1

## 2016-05-23 MED ORDER — OLMESARTAN-AMLODIPINE-HCTZ 40-10-12.5 MG PO TABS: 1.0000 | ORAL_TABLET | Freq: Every day | ORAL | Status: DC

## 2016-05-23 MED ORDER — METRONIDAZOLE IN NACL 5-0.79 MG/ML-% IV SOLN
500.0000 mg | Freq: Three times a day (TID) | INTRAVENOUS | Status: DC
  Administered 2016-05-23 – 2016-05-29 (×17): 500 mg via INTRAVENOUS
  Filled 2016-05-23 (×17): qty 100

## 2016-05-23 MED ORDER — INSULIN DETEMIR 100 UNIT/ML ~~LOC~~ SOLN
15.0000 [IU] | Freq: Every day | SUBCUTANEOUS | Status: DC
  Administered 2016-05-23 – 2016-05-29 (×8): 15 [IU] via SUBCUTANEOUS
  Filled 2016-05-23 (×9): qty 0.15

## 2016-05-23 MED ORDER — IRBESARTAN 300 MG PO TABS
300.0000 mg | ORAL_TABLET | Freq: Every day | ORAL | Status: DC
  Administered 2016-05-23 – 2016-05-30 (×7): 300 mg via ORAL
  Filled 2016-05-23 (×8): qty 1

## 2016-05-23 NOTE — Progress Notes (Signed)
PROGRESS NOTE    [name]  [id number] DOB: 03-06-1941 DOA: 05/22/2016 PCP: Evelyn Coma, MD   Chief Complaint  Patient presents with  . Vaginal Bleeding    Brief Narrative:  HPI On 05/22/2016 by Dr. Lily Fisher  [name] is a 77 y.o. woman with a history of HTN, DM, and CKD 3 who presents to the ED with multiple complaints, including abnormal vaginal bleeding, dysuria (with possible hematuria), and rectal pain with bowel movements (though she denies constipation or the need to strain).  She has not seen any blood in her stool.  She denies associated fever.  No abdominal pain.  No nausea or vomiting, but she reports decreased appetite and 5 lb weight loss since Christmas.  No headache.  No chest pain or shortness of breath.  She has had increased weakness. Assessment & Plan   Abnormal LFTs /Cholelithiasis/hyperbilirubinemia -with abnormal gallbladder appearance on CT.  She has had weight loss and loss of appetite; otherwise, asymptomatic. -Gastroenterology and general surgery consulted and appreciated -Pending MRCP and abdominal MRI -Started empirically on Levaquin, Flagyl -Currently nothing by mouth -Given abnormal findings on CT scan with weight loss as well as vaginal bleeding, and for cancer -pending hepatitis panel  Essential hypertension -Continue amlodipine, chlorothiazide, Avapro  Diabetes mellitus -Continue complaints scale, Levemir, CBG monitoring  Chronic kidney disease, stage III -Patient noted to have GFR in stage III 2015, mild dip in 2017, currently stage III -Creatinine currently 1.4, continue monitor BMP  Possible UTI -Urine culture pending -Continue Levaquin --Urine culture pending  Abnormal vaginal bleeding -Outpatient follow up -Monitor CBC  DVT Prophylaxis  SCDs  Code Status: Full  Family Communication: Family at bedside.   Disposition Plan: Admitted. Pending further workup.   Consultants Gastroenterology,  [name] General surgery  Procedures  None  Antibiotics   Anti-infectives    Start     Dose/Rate Route Frequency Ordered Stop   05/23/16 0400  levofloxacin (LEVAQUIN) IVPB 750 mg     750 mg 100 mL/hr over 90 Minutes Intravenous Every 48 hours 05/23/16 0119     05/23/16 0026  metroNIDAZOLE (FLAGYL) IVPB 500 mg     500 mg 100 mL/hr over 60 Minutes Intravenous Every 8 hours 05/23/16 0026     05/22/16 2045  metroNIDAZOLE (FLAGYL) IVPB 500 mg     500 mg 100 mL/hr over 60 Minutes Intravenous  Once 05/22/16 2043 05/23/16 0042   05/22/16 1930  ciprofloxacin (CIPRO) tablet 500 mg     500 mg Oral  Once 05/22/16 1924 05/22/16 2019      Subjective:   [name] seen and examined today. Patient states she feeling hungry.  Currently denies abdominal pain, nausea, vomiting, dizziness, headache, chest pain, shortness of breath.  Objective:   Vitals:   05/23/16 0916 05/23/16 0917 05/23/16 1118 05/23/16 1330  BP: 133/66 133/66 153/70 (!) 181/80  Pulse:  85 90 83  Resp:  16 18 16   Temp:    98.2 F (36.8 C)  TempSrc:    Oral  SpO2:  98% 98% 100%  Weight:      Height:        Intake/Output Summary (Last 24 hours) at 05/23/16 1436 Last data filed at 05/23/16 0918  Gross per 24 hour  Intake              300 ml  Output                0 ml  Net  300 ml   Filed Weights   05/22/16 1048 05/22/16 2145  Weight: 80.3 kg (177 lb) 78.5 kg (173 lb)    Exam  General: Well developed, well nourished, NAD, appears stated age  33: NCAT, scleral icterus, mucous membranes moist.   Cardiovascular: S1 S2 auscultated, no rubs, murmurs or gallops. Regular rate and rhythm.  Respiratory: Clear to auscultation bilaterally with equal chest rise  Abdomen: Soft, nontender, nondistended, + bowel sounds  Extremities: warm dry without cyanosis clubbing or edema  Neuro: AAOx3, nonfocal  Psych: Normal affect and demeanor with intact judgement and insight   Data Reviewed: I have  personally reviewed following labs and imaging studies  CBC:  Recent Labs Lab 05/22/16 1736 05/23/16 0439  WBC 11.1* 9.9  HGB 11.7* 10.1*  HCT 34.0* 29.6*  MCV 77.6* 75.9*  PLT 388 [id number]   Basic Metabolic Panel:  Recent Labs Lab 05/22/16 1736 05/23/16 0439  NA 134* 136  K 3.9 4.0  CL 101 106  CO2 20* 20*  GLUCOSE 340* 399*  BUN 29* 30*  CREATININE 1.63* 1.40*  CALCIUM 9.4 8.8*   GFR: Estimated Creatinine Clearance: 37.5 mL/min (by C-G formula based on SCr of 1.4 mg/dL (H)). Liver Function Tests:  Recent Labs Lab 05/22/16 1736  AST 177*  ALT 213*  ALKPHOS 623*  BILITOT 7.6*  PROT 9.1*  ALBUMIN 3.5   No results for input(s): LIPASE, AMYLASE in the last 168 hours. No results for input(s): AMMONIA in the last 168 hours. Coagulation Profile:  Recent Labs Lab 05/23/16 0439  INR 1.02   Cardiac Enzymes: No results for input(s): CKTOTAL, CKMB, CKMBINDEX, TROPONINI in the last 168 hours. BNP (last 3 results) No results for input(s): PROBNP in the last 8760 hours. HbA1C: No results for input(s): HGBA1C in the last 72 hours. CBG:  Recent Labs Lab 05/22/16 1730 05/23/16 0030 05/23/16 0731 05/23/16 0831 05/23/16 1159  GLUCAP 319* 348* 352* 336* 228*   Lipid Profile: No results for input(s): CHOL, HDL, LDLCALC, TRIG, CHOLHDL, LDLDIRECT in the last 72 hours. Thyroid Function Tests: No results for input(s): TSH, T4TOTAL, FREET4, T3FREE, THYROIDAB in the last 72 hours. Anemia Panel: No results for input(s): VITAMINB12, FOLATE, FERRITIN, TIBC, IRON, RETICCTPCT in the last 72 hours. Urine analysis:    Component Value Date/Time   COLORURINE AMBER (A) 05/22/2016 1700   APPEARANCEUR CLOUDY (A) 05/22/2016 1700   LABSPEC 1.012 05/22/2016 1700   PHURINE 5.0 05/22/2016 1700   GLUCOSEU 150 (A) 05/22/2016 1700   HGBUR LARGE (A) 05/22/2016 1700   BILIRUBINUR SMALL (A) 05/22/2016 1700   KETONESUR NEGATIVE 05/22/2016 1700   PROTEINUR 100 (A) 05/22/2016 1700    UROBILINOGEN 0.2 12/03/2013 2155   NITRITE NEGATIVE 05/22/2016 1700   LEUKOCYTESUR LARGE (A) 05/22/2016 1700   Sepsis Labs: @LABRCNTIP (procalcitonin:4,lacticidven:4)  )No results found for this or any previous visit (from the past 240 hour(s)).    Radiology Studies: Ct Abdomen Pelvis Wo Contrast  Result Date: 05/22/2016 CLINICAL DATA:  Vaginal bleeding for 1 week EXAM: CT ABDOMEN AND PELVIS WITHOUT CONTRAST TECHNIQUE: Multidetector CT imaging of the abdomen and pelvis was performed following the standard protocol without IV contrast. COMPARISON:  12/09/2012 FINDINGS: Lower chest: Lung bases demonstrate no acute consolidation or pleural effusion. Normal heart size. Hepatobiliary: No focal hepatic abnormality. No biliary dilatation. Calcified gallstone. Possible wall thickening. Ill-defined soft tissue expansion of of the porta hepatis appears different from comparison CT. Pancreas: No peripancreatic inflammation. No dilatation of pancreas duct. Spleen: Normal in size without  focal abnormality. Adrenals/Urinary Tract: Adrenal glands are within normal limits. Punctate calcification upper pole right kidney. Punctate nonobstructing stone or mild vascular calcification mid left kidney. Bladder unremarkable. Stomach/Bowel: Stomach nonenlarged. Small hiatal hernia. No dilated small bowel. No colon wall thickening. Vascular/Lymphatic: Atherosclerotic calcifications. No aneurysm. Enlarged porta hepatis lymph nodes up to 11 mm. Mild retroperitoneal adenopathy unchanged. Reproductive: Calcified uterine fibroid. Mild right adnexal enlargement compared to prior Other: No free air or free fluid. Interim finding of soft tissue infiltration of the anterior pelvic fat. Multiple soft tissue nodules within the lower omentum and within the mesenteric fat. Musculoskeletal: No acute or suspicious bone lesions. IMPRESSION: 1. Interim finding of infiltrative soft tissue density within the anterior lower abdominal/upper pelvic  fat with multiple omental, mesenteric and intraperitoneal nodules visualized. Findings would be concerning for metastatic disease. 2. Calcified uterine fibroids. Mild soft tissue enlargement of the right adnexa. Could consider correlation with pelvic ultrasound. 3. Ill-defined possible soft tissue expansion at the porta hepatis. Follow-up CT examination with contrast would be helpful for further evaluation. Calcified gallstone with possible wall thickening, could correlate with ultrasound. Mild enlarged lymph nodes at the porta hepatis. 4. Nonobstructing stone right kidney. Punctate stone or mild vascular calcification mid left kidney. Electronically Signed   By: Donavan Foil M.D.   On: 05/22/2016 22:45   US Abdomen Complete  Result Date: 05/22/2016 CLINICAL DATA:  Elevated LFTs, UTI EXAM: ABDOMEN ULTRASOUND COMPLETE COMPARISON:  12/03/2015 FINDINGS: Gallbladder: Large shadowing stone in the gallbladder measuring 2.6 cm. Mild wall thickening at 4.3 mm. Negative sonographic Murphy's. Sludge is also present within the gallbladder. Common bile duct: Diameter: Normal at 3.5 mm. Liver: No focal lesion identified. Within normal limits in parenchymal echogenicity. IVC: No abnormality visualized. Pancreas: Visualized portion unremarkable. Spleen: Size and appearance within normal limits. Right Kidney: Length: 8.9 cm. Echogenicity within normal limits. No mass or hydronephrosis visualized. Lower pole obscured by bowel gas Left Kidney: Length: 10 cm. Echogenicity within normal limits. No mass or hydronephrosis visualized. Abdominal aorta: No aneurysm visualized. Other findings: None. IMPRESSION: 1. Hepatic echogenicity is within normal limits. No biliary dilatation 2. Large shadowing stone within the gallbladder with sludge also present. Mild wall thickening of the gallbladder is non specific. Negative sonographic Murphy's. Electronically Signed   By: Donavan Foil M.D.   On: 05/22/2016 20:08     Scheduled Meds: .  amLODipine  10 mg Oral Daily  . irbesartan  300 mg Oral Daily   And  . hydrochlorothiazide  12.5 mg Oral Daily  . insulin aspart  0-5 Units Subcutaneous QHS  . insulin aspart  0-9 Units Subcutaneous TID WC  . insulin detemir  15 Units Subcutaneous QHS  . levofloxacin (LEVAQUIN) IV  750 mg Intravenous Q48H  . metronidazole  500 mg Intravenous Q8H   Continuous Infusions: . sodium chloride 100 mL/hr at 05/22/16 2318     LOS: 1 day   Time Spent in minutes   30 minutes  Lianny Molter D.O. on 05/23/2016 at 2:36 PM  Between 7am to 7pm - Pager - [phone]  After 7pm go to [email or web address] - password [email or web address]  And look for the night coverage person covering for me after hours  [facility] Office  [phone]

## 2016-05-23 NOTE — Consult Note (Addendum)
Referring Provider:  Dr. Thomasene Ripple Primary Care Physician:  Lilian Coma, MD Primary Gastroenterologist:  Brion Aliment has not seen any [facility] GI physician.  Reason for Consultation: Abnormal LFTs/jaundice  HPI: [name] is a 76 y.o. female with past medical history of hypertension, diabetes came into the hospital with weakness and blood in the urine. Patient was found to have abnormal LFTs on initial evaluation. GI is consulted for further evaluation.  Patient seen and examined in the ER. Family at bedside. Going to patient, see has not feeling well since last 2-3 weeks. Complaining of decreased appetite. Complaining of light colored stools as last 1 week. Also started noticing darker urine around 2 weeks ago and subsequently blood in the urine. Patient denied any abdominal pain, nausea, vomiting. Denied any blood in the stool. Denied diarrhea or constipation. Occasional trouble swallowing food. Denied any odynophagia.  No family history of colon cancer or pancreatic cancer.  Stated that she had colonoscopy around 5-10 years ago but does not remember physician's name. No previous EGD according to patient and family.  Past Medical History:  Diagnosis Date  . Diabetes mellitus without complication ([place])   . Hypertension   . 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 CYSTECTOMY    . TUBAL LIGATION      Prior to Admission medications   Medication Sig Start Date End Date Taking? Authorizing Provider  aspirin EC 81 MG tablet Take 81 mg by mouth daily.   Yes Historical Provider, MD  cholecalciferol (VITAMIN D) 1000 UNITS tablet Take 1,000 Units by mouth daily.    Yes Historical Provider, MD  furosemide (LASIX) 40 MG tablet Take 40 mg by mouth daily.    Yes Historical Provider, MD  HUMALOG MIX 50/50 KWIKPEN (50-50) 100 UNIT/ML Kwikpen Inject 10-40 Units into the skin 2 (two) times daily. Inject 40 units under the skin before  breakfast and 10 units under the skin before evening meal. 05/13/16  Yes Historical Provider, MD  Multiple Vitamin (MULTIVITAMIN WITH MINERALS) TABS tablet Take 1 tablet by mouth daily.   Yes Historical Provider, MD  Olmesartan-Amlodipine-HCTZ 40-10-12.5 MG TABS Take 1 tablet by mouth daily. 04/14/16  Yes Historical Provider, MD  Omega-3 Fatty Acids (FISH OIL PO) Take 1 capsule by mouth daily.   Yes Historical Provider, MD    Scheduled Meds: . amLODipine  10 mg Oral Daily  . irbesartan  300 mg Oral Daily   And  . hydrochlorothiazide  12.5 mg Oral Daily  . insulin aspart  0-5 Units Subcutaneous QHS  . insulin aspart  0-9 Units Subcutaneous TID WC  . insulin detemir  15 Units Subcutaneous QHS   Continuous Infusions: . sodium chloride 100 mL/hr at 05/22/16 2318  . levofloxacin (LEVAQUIN) IV Stopped (05/23/16 [id number])  . metronidazole Stopped (05/23/16 0825)   PRN Meds:.ondansetron **OR** ondansetron (ZOFRAN) IV, oxyCODONE  Allergies as of 05/22/2016 - Review Complete 05/22/2016  Allergen Reaction Noted  . Rosiglitazone maleate Other (See Comments)   . Metformin Diarrhea   . Penicillins Hives and Rash 04/09/2006  . Pioglitazone Other (See Comments)     Family History  Problem Relation Age of Onset  . Cancer Mother   . Diabetes Father     Social History   Social History  . Marital status: Married    Spouse name: N/A  . Number of children: N/A  . Years of education: N/A   Occupational History  . Not on file.  Social History Main Topics  . Smoking status: Former Research scientist (life sciences)  . Smokeless tobacco: Never Used  . Alcohol use No  . Drug use: No  . Sexual activity: Not on file   Other Topics Concern  . Not on file   Social History Narrative  . No narrative on file    Review of Systems:  Review of Systems  Constitutional: Positive for malaise/fatigue and weight loss. Negative for chills and fever.  HENT: Negative for ear discharge, ear pain and nosebleeds.   Eyes: Negative  for photophobia, pain and discharge.  Respiratory: Positive for shortness of breath. Negative for hemoptysis.   Cardiovascular: Negative for chest pain and palpitations.  Gastrointestinal: Negative for abdominal pain, blood in stool, melena, nausea and vomiting.  Genitourinary: Positive for hematuria.  Musculoskeletal: Positive for myalgias.  Skin: Negative for rash.  Neurological: Negative for seizures and loss of consciousness.  Endo/Heme/Allergies: Does not bruise/bleed easily.  Psychiatric/Behavioral: Negative for hallucinations and suicidal ideas.    Physical Exam: Vital signs: Vitals:   05/23/16 0917 05/23/16 1118  BP: 133/66 153/70  Pulse: 85 90  Resp: 16 18  Temp:       General:   Alert,  Well-developed, well-nourished, pleasant and cooperative in NAD Head: NS, AT  Eyes. Scleral icterus noted. Extraocular movement intact Neck supple. No thyromegaly. Lungs:  Clear throughout to auscultation.   No wheezes, crackles, or rhonchi. No acute distress. Heart:  Regular rate and rhythm; no murmurs, clicks, rubs,  or gallops. Abdomen: Soft, nontender, nondistended, bowel sounds present. No peritoneal signs. LE: no edema or clubbing  Psych : Alert and oriented 3. Normal mood and affect. Rectal:  Deferred  GI:  Lab Results:  Recent Labs  05/22/16 1736 05/23/16 0439  WBC 11.1* 9.9  HGB 11.7* 10.1*  HCT 34.0* 29.6*  PLT 388 395   BMET  Recent Labs  05/22/16 1736 05/23/16 0439  NA 134* 136  K 3.9 4.0  CL 101 106  CO2 20* 20*  GLUCOSE 340* 399*  BUN 29* 30*  CREATININE 1.63* 1.40*  CALCIUM 9.4 8.8*   LFT  Recent Labs  05/22/16 1736  PROT 9.1*  ALBUMIN 3.5  AST 177*  ALT 213*  ALKPHOS 623*  BILITOT 7.6*   PT/INR  Recent Labs  05/23/16 0439  LABPROT 13.4  INR 1.02     Studies/Results: Ct Abdomen Pelvis Wo Contrast  Result Date: 05/22/2016 CLINICAL DATA:  Vaginal bleeding for 1 week EXAM: CT ABDOMEN AND PELVIS WITHOUT CONTRAST TECHNIQUE:  Multidetector CT imaging of the abdomen and pelvis was performed following the standard protocol without IV contrast. COMPARISON:  12/09/2012 FINDINGS: Lower chest: Lung bases demonstrate no acute consolidation or pleural effusion. Normal heart size. Hepatobiliary: No focal hepatic abnormality. No biliary dilatation. Calcified gallstone. Possible wall thickening. Ill-defined soft tissue expansion of of the porta hepatis appears different from comparison CT. Pancreas: No peripancreatic inflammation. No dilatation of pancreas duct. Spleen: Normal in size without focal abnormality. Adrenals/Urinary Tract: Adrenal glands are within normal limits. Punctate calcification upper pole right kidney. Punctate nonobstructing stone or mild vascular calcification mid left kidney. Bladder unremarkable. Stomach/Bowel: Stomach nonenlarged. Small hiatal hernia. No dilated small bowel. No colon wall thickening. Vascular/Lymphatic: Atherosclerotic calcifications. No aneurysm. Enlarged porta hepatis lymph nodes up to 11 mm. Mild retroperitoneal adenopathy unchanged. Reproductive: Calcified uterine fibroid. Mild right adnexal enlargement compared to prior Other: No free air or free fluid. Interim finding of soft tissue infiltration of the anterior pelvic fat. Multiple soft tissue nodules  within the lower omentum and within the mesenteric fat. Musculoskeletal: No acute or suspicious bone lesions. IMPRESSION: 1. Interim finding of infiltrative soft tissue density within the anterior lower abdominal/upper pelvic fat with multiple omental, mesenteric and intraperitoneal nodules visualized. Findings would be concerning for metastatic disease. 2. Calcified uterine fibroids. Mild soft tissue enlargement of the right adnexa. Could consider correlation with pelvic ultrasound. 3. Ill-defined possible soft tissue expansion at the porta hepatis. Follow-up CT examination with contrast would be helpful for further evaluation. Calcified gallstone with  possible wall thickening, could correlate with ultrasound. Mild enlarged lymph nodes at the porta hepatis. 4. Nonobstructing stone right kidney. Punctate stone or mild vascular calcification mid left kidney. Electronically Signed   By: Donavan Foil M.D.   On: 05/22/2016 22:45   US Abdomen Complete  Result Date: 05/22/2016 CLINICAL DATA:  Elevated LFTs, UTI EXAM: ABDOMEN ULTRASOUND COMPLETE COMPARISON:  12/03/2015 FINDINGS: Gallbladder: Large shadowing stone in the gallbladder measuring 2.6 cm. Mild wall thickening at 4.3 mm. Negative sonographic Murphy's. Sludge is also present within the gallbladder. Common bile duct: Diameter: Normal at 3.5 mm. Liver: No focal lesion identified. Within normal limits in parenchymal echogenicity. IVC: No abnormality visualized. Pancreas: Visualized portion unremarkable. Spleen: Size and appearance within normal limits. Right Kidney: Length: 8.9 cm. Echogenicity within normal limits. No mass or hydronephrosis visualized. Lower pole obscured by bowel gas Left Kidney: Length: 10 cm. Echogenicity within normal limits. No mass or hydronephrosis visualized. Abdominal aorta: No aneurysm visualized. Other findings: None. IMPRESSION: 1. Hepatic echogenicity is within normal limits. No biliary dilatation 2. Large shadowing stone within the gallbladder with sludge also present. Mild wall thickening of the gallbladder is non specific. Negative sonographic Murphy's. Electronically Signed   By: Donavan Foil M.D.   On: 05/22/2016 20:08    Impression/Plan: - Painless jaundice. CT scan showing ill-defined possible soft tissue expansion at the porta hepatis. Normal caliber CBD. - Abnormal CT scan showing infiltrative soft tissue density within the lower abdomen/upper pelvic with multiple omental , mesenteric and intraperitoneal nodules concerning for metastatic disease. - Weight loss - Occasional dysphagia - Chronic kidney disease - Anemia. No overt  bleeding.  Recommendations ------------------------- - Patient with baseline kidney insufficiency limiting contrasted studies. - Recommend MRI-MRCP without contrast for further evaluation  -  Follow hepatitis panel - Further workup based on MRI-MRCP finding. - GI will follow.    LOS: 1 day   Otis Brace  MD, FACP 05/23/2016, 12:55 PM  Pager [phone] If no answer or after 5 PM call [phone]

## 2016-05-23 NOTE — ED Notes (Signed)
NS running at 160ml/hr upon arrival. 821ml remains in bag

## 2016-05-23 NOTE — ED Provider Notes (Signed)
[place] DEPT Provider Note   CSN: [id number] Arrival date & time: 05/22/16  1023     History   Chief Complaint Chief Complaint  Patient presents with  . Vaginal Bleeding    HPI [name] is a 76 y.o. female.  HPI  PT with hx of HTN and DM comes in with cc of burning with urination and blood in the urine. Pt has been having these symptoms for few days. No back pain. + weakness, poor appetite. P reports weight loss, anorexia - but no night sweats. Pt has no hx of UTI.  Pt also noted to have abnormal LFTs. She denies any upper abd pain or pain after food intake.   Past Medical History:  Diagnosis Date  . Diabetes mellitus without complication ([place])   . Hypertension   . Rheumatoid osteoperiostitis [facility])     Patient Active Problem List   Diagnosis Date Noted  . Hyperbilirubinemia 05/22/2016  . Abnormal LFTs 05/22/2016  . Weight loss 05/22/2016  . Loss of appetite 05/22/2016  . Diabetes ([place]) 05/22/2016  . CKD (chronic kidney disease) stage 3, GFR 30-59 ml/min 05/22/2016  . Abnormal vaginal bleeding 05/22/2016  . UTI (urinary tract infection) 05/22/2016  . DYSPEPSIA 10/29/2006  . COUGH 09/28/2006  . URI 09/18/2006  . COLONIC POLYPS 07/23/2006  . DIABETES MELLITUS, TYPE II, UNCONTROLLED, W/VASCULAR COMPS 07/23/2006  . HYPERLIPIDEMIA 04/09/2006  . Essential hypertension 04/09/2006  . CONSTIPATION 04/09/2006  . OVERACTIVE BLADDER 04/09/2006  . OSTEOARTHRITIS 04/09/2006  . OSTEOPOROSIS 04/09/2006    Past Surgical History:  Procedure Laterality Date  . CYSTECTOMY    . TUBAL LIGATION      OB History    No data available       Home Medications    Prior to Admission medications   Medication Sig Start Date End Date Taking? Authorizing Provider  aspirin EC 81 MG tablet Take 81 mg by mouth daily.   Yes Historical Provider, MD  cholecalciferol (VITAMIN D) 1000 UNITS tablet Take 1,000 Units by mouth daily.    Yes Historical Provider, MD  furosemide  (LASIX) 40 MG tablet Take 40 mg by mouth daily.    Yes Historical Provider, MD  HUMALOG MIX 50/50 KWIKPEN (50-50) 100 UNIT/ML Kwikpen Inject 10-40 Units into the skin 2 (two) times daily. Inject 40 units under the skin before breakfast and 10 units under the skin before evening meal. 05/13/16  Yes Historical Provider, MD  Multiple Vitamin (MULTIVITAMIN WITH MINERALS) TABS tablet Take 1 tablet by mouth daily.   Yes Historical Provider, MD  Olmesartan-Amlodipine-HCTZ 40-10-12.5 MG TABS Take 1 tablet by mouth daily. 04/14/16  Yes Historical Provider, MD  Omega-3 Fatty Acids (FISH OIL PO) Take 1 capsule by mouth daily.   Yes Historical Provider, MD    Family History Family History  Problem Relation Age of Onset  . Cancer Mother   . Diabetes Father     Social History Social History  Substance Use Topics  . Smoking status: Former Research scientist (life sciences)  . Smokeless tobacco: Never Used  . Alcohol use No     Allergies   Rosiglitazone maleate; Metformin; Penicillins; and Pioglitazone   Review of Systems Review of Systems  ROS 10 Systems reviewed and are negative for acute change except as noted in the HPI.    Physical Exam Updated Vital Signs BP 157/79 (BP Location: Right Arm)   Pulse 97   Temp 98 F (36.7 C) (Oral)   Resp 14   Ht 5\' 7"  (1.702  m)   Wt 173 lb (78.5 kg)   SpO2 99%   BMI 27.10 kg/m   Physical Exam  Constitutional: She is oriented to person, place, and time. She appears well-developed and well-nourished.  HENT:  Head: Normocephalic and atraumatic.  Eyes: EOM are normal. Pupils are equal, round, and reactive to light. Scleral icterus is present.  Neck: Neck supple.  Cardiovascular: Normal rate, regular rhythm and normal heart sounds.   No murmur heard. Pulmonary/Chest: Effort normal. No respiratory distress.  Abdominal: Soft. She exhibits no distension and no mass. There is tenderness. There is no rebound and no guarding.  Neurological: She is alert and oriented to person,  place, and time.  Skin: Skin is warm and dry.  Nursing note and vitals reviewed.    ED Treatments / Results  Labs (all labs ordered are listed, but only abnormal results are displayed) Labs Reviewed  COMPREHENSIVE METABOLIC PANEL - Abnormal; Notable for the following:       Result Value   Sodium 134 (*)    CO2 20 (*)    Glucose, Bld 340 (*)    BUN 29 (*)    Creatinine, Ser 1.63 (*)    Total Protein 9.1 (*)    AST 177 (*)    ALT 213 (*)    Alkaline Phosphatase 623 (*)    Total Bilirubin 7.6 (*)    GFR calc non Af Amer 30 (*)    GFR calc Af Amer 34 (*)    All other components within normal limits  CBC - Abnormal; Notable for the following:    WBC 11.1 (*)    Hemoglobin 11.7 (*)    HCT 34.0 (*)    MCV 77.6 (*)    RDW 16.7 (*)    All other components within normal limits  URINALYSIS, ROUTINE W REFLEX MICROSCOPIC - Abnormal; Notable for the following:    Color, Urine AMBER (*)    APPearance CLOUDY (*)    Glucose, UA 150 (*)    Hgb urine dipstick LARGE (*)    Bilirubin Urine SMALL (*)    Protein, ur 100 (*)    Leukocytes, UA LARGE (*)    Bacteria, UA RARE (*)    Squamous Epithelial / LPF 6-30 (*)    All other components within normal limits  ACETAMINOPHEN LEVEL - Abnormal; Notable for the following:    Acetaminophen (Tylenol), Serum <10 (*)    All other components within normal limits  CBG MONITORING, ED - Abnormal; Notable for the following:    Glucose-Capillary 319 (*)    All other components within normal limits  URINE CULTURE  SALICYLATE LEVEL  ETHANOL  RAPID URINE DRUG SCREEN, HOSP PERFORMED  HEPATITIS PANEL, ACUTE  HIV ANTIBODY (ROUTINE TESTING)    EKG  EKG Interpretation  Date/Time:  Thursday May 22 2016 20:01:42 EST Ventricular Rate:  97 PR Interval:    QRS Duration: 87 QT Interval:  369 QTC Calculation: 469 R Axis:   40 Text Interpretation:  Sinus rhythm Consider right atrial enlargement Minimal ST elevation, anterior leads No acute changes No  significant change since last tracing Confirmed by Kathrynn Humble, MD, Thelma Comp [phone]) on 05/23/2016 12:04:30 AM       Radiology Ct Abdomen Pelvis Wo Contrast  Result Date: 05/22/2016 CLINICAL DATA:  Vaginal bleeding for 1 week EXAM: CT ABDOMEN AND PELVIS WITHOUT CONTRAST TECHNIQUE: Multidetector CT imaging of the abdomen and pelvis was performed following the standard protocol without IV contrast. COMPARISON:  12/09/2012 FINDINGS: Lower chest:  Lung bases demonstrate no acute consolidation or pleural effusion. Normal heart size. Hepatobiliary: No focal hepatic abnormality. No biliary dilatation. Calcified gallstone. Possible wall thickening. Ill-defined soft tissue expansion of of the porta hepatis appears different from comparison CT. Pancreas: No peripancreatic inflammation. No dilatation of pancreas duct. Spleen: Normal in size without focal abnormality. Adrenals/Urinary Tract: Adrenal glands are within normal limits. Punctate calcification upper pole right kidney. Punctate nonobstructing stone or mild vascular calcification mid left kidney. Bladder unremarkable. Stomach/Bowel: Stomach nonenlarged. Small hiatal hernia. No dilated small bowel. No colon wall thickening. Vascular/Lymphatic: Atherosclerotic calcifications. No aneurysm. Enlarged porta hepatis lymph nodes up to 11 mm. Mild retroperitoneal adenopathy unchanged. Reproductive: Calcified uterine fibroid. Mild right adnexal enlargement compared to prior Other: No free air or free fluid. Interim finding of soft tissue infiltration of the anterior pelvic fat. Multiple soft tissue nodules within the lower omentum and within the mesenteric fat. Musculoskeletal: No acute or suspicious bone lesions. IMPRESSION: 1. Interim finding of infiltrative soft tissue density within the anterior lower abdominal/upper pelvic fat with multiple omental, mesenteric and intraperitoneal nodules visualized. Findings would be concerning for metastatic disease. 2. Calcified uterine  fibroids. Mild soft tissue enlargement of the right adnexa. Could consider correlation with pelvic ultrasound. 3. Ill-defined possible soft tissue expansion at the porta hepatis. Follow-up CT examination with contrast would be helpful for further evaluation. Calcified gallstone with possible wall thickening, could correlate with ultrasound. Mild enlarged lymph nodes at the porta hepatis. 4. Nonobstructing stone right kidney. Punctate stone or mild vascular calcification mid left kidney. Electronically Signed   By: Donavan Foil M.D.   On: 05/22/2016 22:45   US Abdomen Complete  Result Date: 05/22/2016 CLINICAL DATA:  Elevated LFTs, UTI EXAM: ABDOMEN ULTRASOUND COMPLETE COMPARISON:  12/03/2015 FINDINGS: Gallbladder: Large shadowing stone in the gallbladder measuring 2.6 cm. Mild wall thickening at 4.3 mm. Negative sonographic Murphy's. Sludge is also present within the gallbladder. Common bile duct: Diameter: Normal at 3.5 mm. Liver: No focal lesion identified. Within normal limits in parenchymal echogenicity. IVC: No abnormality visualized. Pancreas: Visualized portion unremarkable. Spleen: Size and appearance within normal limits. Right Kidney: Length: 8.9 cm. Echogenicity within normal limits. No mass or hydronephrosis visualized. Lower pole obscured by bowel gas Left Kidney: Length: 10 cm. Echogenicity within normal limits. No mass or hydronephrosis visualized. Abdominal aorta: No aneurysm visualized. Other findings: None. IMPRESSION: 1. Hepatic echogenicity is within normal limits. No biliary dilatation 2. Large shadowing stone within the gallbladder with sludge also present. Mild wall thickening of the gallbladder is non specific. Negative sonographic Murphy's. Electronically Signed   By: Donavan Foil M.D.   On: 05/22/2016 20:08    Procedures Procedures (including critical care time)  Medications Ordered in ED Medications  0.9 %  sodium chloride infusion ( Intravenous New Bag/Given 05/22/16 2318)    ciprofloxacin (CIPRO) tablet 500 mg (500 mg Oral Given 05/22/16 2019)  metroNIDAZOLE (FLAGYL) IVPB 500 mg (500 mg Intravenous New Bag/Given 05/22/16 2318)     Initial Impression / Assessment and Plan / ED Course  I have reviewed the triage vital signs and the nursing notes.  Pertinent labs & imaging results that were available during my care of the patient were reviewed by me and considered in my medical decision making (see chart for details).  Clinical Course as of May 23 21  Thu May 22, 2016  2030 Spoke with Dr. Silverio Decamp, GI. Pt has elevated bili, elevated LFTs and [place] abd showing gallstones and biliary sludge. Question was to see  if pt needs CT scan or should just get ERCP/MRCP. Dr. Silverio Decamp recommends starting antibiotics and consult Surgery. She agrees that pt will need further evaluation for the lab abnormalities. We discussed my concerns for cancer - given the weight loss, no focal abd pain - and really low concern from my side for cholangitis. She has asked me to hold of on CT for now - they will assess the patient.   US Abdomen Complete [AN]  2300 Dr. Harlow Asa, Surgery will see the patient tomorrow.  [AN]    Clinical Course User Index [AN] Varney Biles, MD    Pt comes in with cc of blood in the urine. She also is having some spotting. She is having some burning with urination. UA shows +++WBC. Clinically, it appears that pt has cystitis. We will start cipro. Abnormal LFTs and jaundice will need further eval. [place] ordered. I have concerns for hepatobiliary CA as well. GI will be consulted.  Final Clinical Impressions(s) / ED Diagnoses   Final diagnoses:  Hyperbilirubinemia  Weight loss    New Prescriptions New Prescriptions   No medications on file     Varney Biles, MD 05/23/16 [id number]

## 2016-05-23 NOTE — Progress Notes (Signed)
Pharmacy Antibiotic Note  [name] is a 76 y.o. female c/o buring and blood with urination admitted on 05/22/2016 with intra-abdominal infection.  Pharmacy has been consulted for Levaquin dosing.  Plan: Levaquin 750 mg IV q48h Flagyl 500 mg IV q8h (MD)  Height: 5\' 7"  (170.2 cm) Weight: 173 lb (78.5 kg) IBW/kg (Calculated) : 61.6  Temp (24hrs), Avg:98.4 F (36.9 C), Min:98 F (36.7 C), Max:98.9 F (37.2 C)   Recent Labs Lab 05/22/16 1736  WBC 11.1*  CREATININE 1.63*    Estimated Creatinine Clearance: 32.2 mL/min (by C-G formula based on SCr of 1.63 mg/dL (H)).    Allergies  Allergen Reactions  . Rosiglitazone Maleate Other (See Comments)    couldn't sleep  . Metformin Diarrhea  . Penicillins Hives and Rash    Has patient had a PCN reaction causing immediate rash, facial/tongue/throat swelling, SOB or lightheadedness with hypotension: yes Has patient had a PCN reaction causing severe rash involving mucus membranes or skin necrosis: no Has patient had a PCN reaction that required hospitalization:no Has patient had a PCN reaction occurring within the last 10 years: no If all of the above answers are "NO", then may proceed with Cephalosporin use.  . Pioglitazone Other (See Comments)    Joint pain     Antimicrobials this admission: 1/11 cipro po >> x1 ED 1/11 flagyl >> 1/12 levaquin >>   Dose adjustments this admission:   Microbiology results:  BCx:   UCx:    Sputum:    MRSA PCR:   Thank you for allowing pharmacy to be a part of this patient's care.  Dorrene German 05/23/2016 12:40 AM

## 2016-05-23 NOTE — Consult Note (Signed)
[facility] Surgery ConsultNote  [name] April 14, 1941  [id number].    Requesting MD: Ree Kida, MD Chief Complaint/Reason for Consult: hyperbilirubinemia, abnormal LFT's, cholelithiasis  HPI:  76 year-old female with a medical history significant for HTN, DM, and CKD who presented to the ED with abnormal vaginal bleeding, dysuria, and white stools. She reports fatigue and vaginal spotting for 2 weeks, along with white stools for 1 week. She states that there was a long wait to get in to see her PCP so she came to the ED because she was concerned. She denies a known history of gallstones. Denies abdominal pain, post-prandial abdominal pain, reflux, nausea, vomiting, or diarrhea. She reports intermittent rectal fullness/tenesmus relieved by urination. She denies a personal history of cancer but states that her mother had breast cancer, her sister has breast cancer, and her aunt has colon cancer. She has had 2 colonoscopies, the last of which was 5 years ago, and they were both normal. When I asked about weight loss she reports a 5 pound weight loss in the past two weeks.   Surgical history significant for cystectomy and tubal ligation. Patient takes 81 mg ASA daily and denies use of blood thinning medications.   ED workup significant for: possible urinary tract infection, leukocytosis (11.1, WNL after IV abx), and significantly elevated LFT's (ASLT 213, AST 177, Alk Phos 623, t.bili 7.6).  RUQ U/S: large gallstone with mild gallbladder wall thickening, negative murphy's sign, CBD 3.5 mm CT Abdomen pelvic w/o contrast: infiltrative soft tissue density within lower abdomen/pelvis. Multiple omental, mesenteric, and intraperitoneal nodules, and an ill defined possible soft tissue expansion at porta hepatis.   ROS: Review of Systems  Constitutional: Positive for malaise/fatigue and weight loss. Negative for chills and fever.  Respiratory: Negative for hemoptysis and shortness of breath.    Cardiovascular: Negative for chest pain and palpitations.  Gastrointestinal: Negative for abdominal pain, blood in stool, constipation, diarrhea, heartburn, melena, nausea and vomiting.  Genitourinary: Positive for dysuria.  Neurological: Positive for weakness.  All other systems reviewed and are negative.    Family History  Problem Relation Age of Onset  . Cancer Mother   . Diabetes Father     Past Medical History:  Diagnosis Date  . Diabetes mellitus without complication ([place])   . Hypertension   . 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 CYSTECTOMY    . TUBAL LIGATION      Social History:  reports that she has quit smoking. She has never used smokeless tobacco. She reports that she does not drink alcohol or use drugs.  Allergies:  Allergies  Allergen Reactions  . Rosiglitazone Maleate Other (See Comments)    couldn't sleep  . Metformin Diarrhea  . Penicillins Hives and Rash    Has patient had a PCN reaction causing immediate rash, facial/tongue/throat swelling, SOB or lightheadedness with hypotension: yes Has patient had a PCN reaction causing severe rash involving mucus membranes or skin necrosis: no Has patient had a PCN reaction that required hospitalization:no Has patient had a PCN reaction occurring within the last 10 years: no If all of the above answers are "NO", then may proceed with Cephalosporin use.  . Pioglitazone Other (See Comments)    Joint pain      (Not in a hospital admission)  Blood pressure 153/70, pulse 90, temperature 98 F (36.7 C), temperature source Oral, resp. rate 18, height _0  (1.702 m), weight 78.5 kg (173 lb),  SpO2 98 %. Physical Exam: General: pleasant, obese AA female who is laying in bed in NAD HEENT: head is normocephalic, atraumatic.  Icteric scleral. + palatal jaundice. Pupils equal and round.  Heart: regular, rate, and rhythm.  No obvious murmurs, gallops, or rubs noted.    Lungs: CTAB, no wheezes, rhonchi, or rales noted.  Respiratory effort nonlabored Abd: soft, NT/ND, +BS, no masses, hernias, or organomegaly MS: all 4 extremities are symmetrical with no cyanosis, clubbing, or edema. Skin: warm and dry  Psych: A&Ox3 with an appropriate affect. Neuro: grossly intact, normal speech  Results for orders placed or performed during the hospital encounter of 05/22/16 (from the past 48 hour(s))  Urinalysis, Routine w reflex microscopic     Status: Abnormal   Collection Time: 05/22/16  5:00 PM  Result Value Ref Range   Color, Urine AMBER (A) YELLOW    Comment: BIOCHEMICALS MAY BE AFFECTED BY COLOR   APPearance CLOUDY (A) CLEAR   Specific Gravity, Urine 1.012 1.005 - 1.030   pH 5.0 5.0 - 8.0   Glucose, UA 150 (A) NEGATIVE mg/dL   Hgb urine dipstick LARGE (A) NEGATIVE   Bilirubin Urine SMALL (A) NEGATIVE   Ketones, ur NEGATIVE NEGATIVE mg/dL   Protein, ur 100 (A) NEGATIVE mg/dL   Nitrite NEGATIVE NEGATIVE   Leukocytes, UA LARGE (A) NEGATIVE   RBC / HPF TOO NUMEROUS TO COUNT 0 - 5 RBC/hpf   WBC, UA TOO NUMEROUS TO COUNT 0 - 5 WBC/hpf   Bacteria, UA RARE (A) NONE SEEN   Squamous Epithelial / LPF 6-30 (A) NONE SEEN   Mucous PRESENT    Hyaline Casts, UA PRESENT   Urine rapid drug screen (hosp performed)     Status: None   Collection Time: 05/22/16  5:00 PM  Result Value Ref Range   Opiates NONE DETECTED NONE DETECTED   Cocaine NONE DETECTED NONE DETECTED   Benzodiazepines NONE DETECTED NONE DETECTED   Amphetamines NONE DETECTED NONE DETECTED   Tetrahydrocannabinol NONE DETECTED NONE DETECTED   Barbiturates NONE DETECTED NONE DETECTED    Comment:        DRUG SCREEN FOR MEDICAL PURPOSES ONLY.  IF CONFIRMATION IS NEEDED FOR ANY PURPOSE, NOTIFY LAB WITHIN 5 DAYS.        LOWEST DETECTABLE LIMITS FOR URINE DRUG SCREEN Drug Class       Cutoff (ng/mL) Amphetamine      1000 Barbiturate      200 Benzodiazepine   578 Tricyclics       469 Opiates           300 Cocaine          300 THC              50   CBG monitoring, ED     Status: Abnormal   Collection Time: 05/22/16  5:30 PM  Result Value Ref Range   Glucose-Capillary 319 (H) 65 - 99 mg/dL  Comprehensive metabolic panel     Status: Abnormal   Collection Time: 05/22/16  5:36 PM  Result Value Ref Range   Sodium 134 (L) 135 - 145 mmol/L   Potassium 3.9 3.5 - 5.1 mmol/L   Chloride 101 101 - 111 mmol/L   CO2 20 (L) 22 - 32 mmol/L   Glucose, Bld 340 (H) 65 - 99 mg/dL   BUN 29 (H) 6 - 20 mg/dL   Creatinine, Ser 1.63 (H) 0.44 - 1.00 mg/dL   Calcium 9.4 8.9 - 10.3 mg/dL  Total Protein 9.1 (H) 6.5 - 8.1 g/dL   Albumin 3.5 3.5 - 5.0 g/dL   AST 177 (H) 15 - 41 U/L   ALT 213 (H) 14 - 54 U/L   Alkaline Phosphatase 623 (H) 38 - 126 U/L   Total Bilirubin 7.6 (H) 0.3 - 1.2 mg/dL   GFR calc non Af Amer 30 (L) >60 mL/min   GFR calc Af Amer 34 (L) >60 mL/min    Comment: (NOTE) The eGFR has been calculated using the CKD EPI equation. This calculation has not been validated in all clinical situations. eGFR's persistently <60 mL/min signify possible Chronic Kidney Disease.    Anion gap 13 5 - 15  CBC     Status: Abnormal   Collection Time: 05/22/16  5:36 PM  Result Value Ref Range   WBC 11.1 (H) 4.0 - 10.5 K/uL   RBC 4.38 3.87 - 5.11 MIL/uL   Hemoglobin 11.7 (L) 12.0 - 15.0 g/dL   HCT 34.0 (L) 36.0 - 46.0 %   MCV 77.6 (L) 78.0 - 100.0 fL   MCH 26.7 26.0 - 34.0 pg   MCHC 34.4 30.0 - 36.0 g/dL   RDW 16.7 (H) 11.5 - 15.5 %   Platelets 388 150 - 400 K/uL  Acetaminophen level     Status: Abnormal   Collection Time: 05/22/16  8:12 PM  Result Value Ref Range   Acetaminophen (Tylenol), Serum <10 (L) 10 - 30 ug/mL    Comment:        THERAPEUTIC CONCENTRATIONS VARY SIGNIFICANTLY. A RANGE OF 10-30 ug/mL MAY BE AN EFFECTIVE CONCENTRATION FOR MANY PATIENTS. HOWEVER, SOME ARE BEST TREATED AT CONCENTRATIONS OUTSIDE THIS RANGE. ACETAMINOPHEN CONCENTRATIONS >150 ug/mL AT 4 HOURS AFTER INGESTION  AND >50 ug/mL AT 12 HOURS AFTER INGESTION ARE OFTEN ASSOCIATED WITH TOXIC REACTIONS.   Salicylate level     Status: None   Collection Time: 05/22/16  8:12 PM  Result Value Ref Range   Salicylate Lvl <9.3 2.8 - 30.0 mg/dL  Ethanol     Status: None   Collection Time: 05/22/16  8:12 PM  Result Value Ref Range   Alcohol, Ethyl (B) <5 <5 mg/dL    Comment:        LOWEST DETECTABLE LIMIT FOR SERUM ALCOHOL IS 5 mg/dL FOR MEDICAL PURPOSES ONLY   CBG monitoring, ED     Status: Abnormal   Collection Time: 05/23/16 12:30 AM  Result Value Ref Range   Glucose-Capillary 348 (H) 65 - 99 mg/dL  CBC     Status: Abnormal   Collection Time: 05/23/16  4:39 AM  Result Value Ref Range   WBC 9.9 4.0 - 10.5 K/uL   RBC 3.90 3.87 - 5.11 MIL/uL   Hemoglobin 10.1 (L) 12.0 - 15.0 g/dL   HCT 29.6 (L) 36.0 - 46.0 %   MCV 75.9 (L) 78.0 - 100.0 fL   MCH 25.9 (L) 26.0 - 34.0 pg   MCHC 34.1 30.0 - 36.0 g/dL   RDW 16.8 (H) 11.5 - 15.5 %   Platelets 395 150 - 400 K/uL  Basic metabolic panel     Status: Abnormal   Collection Time: 05/23/16  4:39 AM  Result Value Ref Range   Sodium 136 135 - 145 mmol/L   Potassium 4.0 3.5 - 5.1 mmol/L   Chloride 106 101 - 111 mmol/L   CO2 20 (L) 22 - 32 mmol/L   Glucose, Bld 399 (H) 65 - 99 mg/dL   BUN 30 (H) 6 - 20  mg/dL   Creatinine, Ser 1.40 (H) 0.44 - 1.00 mg/dL   Calcium 8.8 (L) 8.9 - 10.3 mg/dL   GFR calc non Af Amer 36 (L) >60 mL/min   GFR calc Af Amer 41 (L) >60 mL/min    Comment: (NOTE) The eGFR has been calculated using the CKD EPI equation. This calculation has not been validated in all clinical situations. eGFR's persistently <60 mL/min signify possible Chronic Kidney Disease.    Anion gap 10 5 - 15  Protime-INR     Status: None   Collection Time: 05/23/16  4:39 AM  Result Value Ref Range   Prothrombin Time 13.4 11.4 - 15.2 seconds   INR 1.02   CBG monitoring, ED     Status: Abnormal   Collection Time: 05/23/16  7:31 AM  Result Value Ref Range    Glucose-Capillary 352 (H) 65 - 99 mg/dL  CBG monitoring, ED     Status: Abnormal   Collection Time: 05/23/16  8:31 AM  Result Value Ref Range   Glucose-Capillary 336 (H) 65 - 99 mg/dL   Ct Abdomen Pelvis Wo Contrast  Result Date: 05/22/2016 CLINICAL DATA:  Vaginal bleeding for 1 week EXAM: CT ABDOMEN AND PELVIS WITHOUT CONTRAST TECHNIQUE: Multidetector CT imaging of the abdomen and pelvis was performed following the standard protocol without IV contrast. COMPARISON:  12/09/2012 FINDINGS: Lower chest: Lung bases demonstrate no acute consolidation or pleural effusion. Normal heart size. Hepatobiliary: No focal hepatic abnormality. No biliary dilatation. Calcified gallstone. Possible wall thickening. Ill-defined soft tissue expansion of of the porta hepatis appears different from comparison CT. Pancreas: No peripancreatic inflammation. No dilatation of pancreas duct. Spleen: Normal in size without focal abnormality. Adrenals/Urinary Tract: Adrenal glands are within normal limits. Punctate calcification upper pole right kidney. Punctate nonobstructing stone or mild vascular calcification mid left kidney. Bladder unremarkable. Stomach/Bowel: Stomach nonenlarged. Small hiatal hernia. No dilated small bowel. No colon wall thickening. Vascular/Lymphatic: Atherosclerotic calcifications. No aneurysm. Enlarged porta hepatis lymph nodes up to 11 mm. Mild retroperitoneal adenopathy unchanged. Reproductive: Calcified uterine fibroid. Mild right adnexal enlargement compared to prior Other: No free air or free fluid. Interim finding of soft tissue infiltration of the anterior pelvic fat. Multiple soft tissue nodules within the lower omentum and within the mesenteric fat. Musculoskeletal: No acute or suspicious bone lesions. IMPRESSION: 1. Interim finding of infiltrative soft tissue density within the anterior lower abdominal/upper pelvic fat with multiple omental, mesenteric and intraperitoneal nodules visualized. Findings  would be concerning for metastatic disease. 2. Calcified uterine fibroids. Mild soft tissue enlargement of the right adnexa. Could consider correlation with pelvic ultrasound. 3. Ill-defined possible soft tissue expansion at the porta hepatis. Follow-up CT examination with contrast would be helpful for further evaluation. Calcified gallstone with possible wall thickening, could correlate with ultrasound. Mild enlarged lymph nodes at the porta hepatis. 4. Nonobstructing stone right kidney. Punctate stone or mild vascular calcification mid left kidney. Electronically Signed   By: Donavan Foil M.D.   On: 05/22/2016 22:45   US Abdomen Complete  Result Date: 05/22/2016 CLINICAL DATA:  Elevated LFTs, UTI EXAM: ABDOMEN ULTRASOUND COMPLETE COMPARISON:  12/03/2015 FINDINGS: Gallbladder: Large shadowing stone in the gallbladder measuring 2.6 cm. Mild wall thickening at 4.3 mm. Negative sonographic Murphy's. Sludge is also present within the gallbladder. Common bile duct: Diameter: Normal at 3.5 mm. Liver: No focal lesion identified. Within normal limits in parenchymal echogenicity. IVC: No abnormality visualized. Pancreas: Visualized portion unremarkable. Spleen: Size and appearance within normal limits. Right Kidney: Length: 8.9  cm. Echogenicity within normal limits. No mass or hydronephrosis visualized. Lower pole obscured by bowel gas Left Kidney: Length: 10 cm. Echogenicity within normal limits. No mass or hydronephrosis visualized. Abdominal aorta: No aneurysm visualized. Other findings: None. IMPRESSION: 1. Hepatic echogenicity is within normal limits. No biliary dilatation 2. Large shadowing stone within the gallbladder with sludge also present. Mild wall thickening of the gallbladder is non specific. Negative sonographic Murphy's. Electronically Signed   By: Donavan Foil M.D.   On: 05/22/2016 20:08   Assessment/Plan Cholelithiasis Abnormal LFT's Hyperbilirubinemia - R/o acute cholecystitis  - benign  abdominal exam - significantly elevated LFTs and bilirubin, WBC 9 - on empiric levaquin, flagyl for intra-abdominal infection -  Agree with MRCP   UTI Vaginal bleeding  HTN -  DM - Levemir, SSI CKD  FEN: NPO. IVF ID: ciprofloxacin once 1/11, Levaquin 1/12 >>, Flagyl 1/11 >>  VTE: SCD's  Plan: Patient denies symptoms consistent with biliary colic, RUQ is non-tender, negative murphy's sign, WBC WNL. Vaginal bleeding, rectal pain, weight loss/anroexia, and CT results raise concern for metastatic disease. Agree with MRCP to further evaluate biliary tree. General surgery will follow.   Jill Alexanders, [facility] Surgery 05/23/2016, 11:39 AM Pager: [phone] Consults: [phone] Mon-Fri 7:00 am-4:30 pm Sat-Sun 7:00 am-11:30 am

## 2016-05-23 NOTE — ED Notes (Signed)
Pt ambulatory to restroom

## 2016-05-24 DIAGNOSIS — K7689 Other specified diseases of liver: Secondary | ICD-10-CM

## 2016-05-24 DIAGNOSIS — R160 Hepatomegaly, not elsewhere classified: Secondary | ICD-10-CM

## 2016-05-24 DIAGNOSIS — K7589 Other specified inflammatory liver diseases: Secondary | ICD-10-CM

## 2016-05-24 DIAGNOSIS — K828 Other specified diseases of gallbladder: Secondary | ICD-10-CM

## 2016-05-24 LAB — HEPATITIS PANEL, ACUTE
HCV Ab: <0.1 s/co ratio (ref 0.0–0.9)
HEP A IGM: NEGATIVE
HEP B C IGM: NEGATIVE
Hepatitis B Surface Ag: NEGATIVE

## 2016-05-24 LAB — COMPREHENSIVE METABOLIC PANEL
ALT: 161 U/L — AB (ref 14–54)
AST: 147 U/L — AB (ref 15–41)
Albumin: 2.9 g/dL — ABNORMAL LOW (ref 3.5–5.0)
Alkaline Phosphatase: 515 U/L — ABNORMAL HIGH (ref 38–126)
Anion gap: 11 (ref 5–15)
BUN: 21 mg/dL — AB (ref 6–20)
CALCIUM: 8.6 mg/dL — AB (ref 8.9–10.3)
CHLORIDE: 109 mmol/L (ref 101–111)
CO2: 20 mmol/L — ABNORMAL LOW (ref 22–32)
CREATININE: 1.4 mg/dL — AB (ref 0.44–1.00)
GFR calc Af Amer: 41 mL/min — ABNORMAL LOW (ref >60)
GFR, EST NON AFRICAN AMERICAN: 36 mL/min — AB (ref >60)
Glucose, Bld: 193 mg/dL — ABNORMAL HIGH (ref 65–99)
Potassium: 3.3 mmol/L — ABNORMAL LOW (ref 3.5–5.1)
Sodium: 140 mmol/L (ref 135–145)
TOTAL PROTEIN: 7.7 g/dL (ref 6.5–8.1)
Total Bilirubin: 6.7 mg/dL — ABNORMAL HIGH (ref 0.3–1.2)

## 2016-05-24 LAB — CBC
HCT: 30.9 % — ABNORMAL LOW (ref 36.0–46.0)
Hemoglobin: 10.4 g/dL — ABNORMAL LOW (ref 12.0–15.0)
MCH: 25.7 pg — ABNORMAL LOW (ref 26.0–34.0)
MCHC: 33.7 g/dL (ref 30.0–36.0)
MCV: 76.3 fL — AB (ref 78.0–100.0)
PLATELETS: 357 10*3/uL (ref 150–400)
RBC: 4.05 MIL/uL (ref 3.87–5.11)
RDW: 16.7 % — AB (ref 11.5–15.5)
WBC: 11.3 10*3/uL — AB (ref 4.0–10.5)

## 2016-05-24 LAB — GLUCOSE, CAPILLARY
GLUCOSE-CAPILLARY: 175 mg/dL — AB (ref 65–99)
GLUCOSE-CAPILLARY: 154 mg/dL — AB (ref 65–99)
GLUCOSE-CAPILLARY: 188 mg/dL — AB (ref 65–99)
GLUCOSE-CAPILLARY: 304 mg/dL — AB (ref 65–99)

## 2016-05-24 LAB — URINE CULTURE

## 2016-05-24 LAB — HIV ANTIBODY (ROUTINE TESTING W REFLEX): HIV Screen 4th Generation wRfx: NONREACTIVE

## 2016-05-24 MED ORDER — POTASSIUM CHLORIDE CRYS ER 20 MEQ PO TBCR
40.0000 meq | EXTENDED_RELEASE_TABLET | Freq: Once | ORAL | Status: AC
  Administered 2016-05-24: 40 meq via ORAL
  Filled 2016-05-24: qty 2

## 2016-05-24 NOTE — Progress Notes (Addendum)
PROGRESS NOTE    [name]  [id number] DOB: January 19, 1941 DOA: 05/22/2016 PCP: Lilian Coma, MD   Chief Complaint  Patient presents with  . Vaginal Bleeding    Brief Narrative:  HPI On 05/22/2016 by Dr. Lily Fisher  [name] is a 76 y.o. woman with a history of HTN, DM, and CKD 3 who presents to the ED with multiple complaints, including abnormal vaginal bleeding, dysuria (with possible hematuria), and rectal pain with bowel movements (though she denies constipation or the need to strain).  She has not seen any blood in her stool.  She denies associated fever.  No abdominal pain.  No nausea or vomiting, but she reports decreased appetite and 5 lb weight loss since Christmas.  No headache.  No chest pain or shortness of breath.  She has had increased weakness. Assessment & Plan   Abnormal LFTs /hyperbilirubinemia -with abnormal gallbladder appearance on CT.  She has had weight loss and loss of appetite; otherwise, asymptomatic. -[name] and general Fisher consulted and appreciated -Started empirically on Levaquin, Flagyl -Given abnormal findings on CT scan with weight loss as well as vaginal bleeding, and for cancer -pending hepatitis panel -MRCP and abdominal MRI: Borderline intrahepatic duct dilatation, narrowing of common bile duct in the porta hepatis at the site of soft tissue signal lesion likely infiltrative adenopathy. Gallstones with nonspecific gallbladder fundal wall thickening suspicious for liver lesion -Spoke with general Fisher, no role for Fisher at this time. -Spoke with GI, recommended biopsy, possible ERCP with stenting next week. -Interventional radiology consulted for biopsy. -CEA and CA19-9 pending  Essential hypertension -Continue amlodipine, chlorothiazide, Avapro  Diabetes mellitus -Continue complaints scale, Levemir, CBG monitoring  Chronic kidney disease, stage III -Patient noted to have GFR in stage III 2015, mild dip  in 2017, currently stage III -Creatinine currently 1.4, continue monitor CMP  Hypokalemia -Replaced, continue to monitor CMP  Possible UTI -Urine culture showed multiple species -Continue Levaquin  Abnormal vaginal bleeding -Possibly related to the above -Outpatient follow up -Monitor CBC -Hemoglobin currently 10.4  DVT Prophylaxis  SCDs  Code Status: Full  Family Communication: None at bedside.   Disposition Plan: Admitted. Pending further workup.   Consultants [name], [name] Interventional radiology  Procedures  None  Antibiotics   Anti-infectives    Start     Dose/Rate Route Frequency Ordered Stop   05/23/16 0400  levofloxacin (LEVAQUIN) IVPB 750 mg     750 mg 100 mL/hr over 90 Minutes Intravenous Every 48 hours 05/23/16 0119     05/23/16 0026  metroNIDAZOLE (FLAGYL) IVPB 500 mg     500 mg 100 mL/hr over 60 Minutes Intravenous Every 8 hours 05/23/16 0026     05/22/16 2045  metroNIDAZOLE (FLAGYL) IVPB 500 mg     500 mg 100 mL/hr over 60 Minutes Intravenous  Once 05/22/16 2043 05/23/16 0042   05/22/16 1930  ciprofloxacin (CIPRO) tablet 500 mg     500 mg Oral  Once 05/22/16 1924 05/22/16 2019      Subjective:   [name] seen and examined today. Patient feels weak this morning. Had abdominal pain overnight, around her umbilicus with nausea.  Tried to vomit once. Currently denies further abdominal pain or nausea.  Denies dizziness, headache, chest pain, shortness of breath.   Objective:   Vitals:   05/23/16 1330 05/23/16 2004 05/24/16 0528 05/24/16 1029  BP: (!) 181/80 132/65 140/80 (!) 147/72  Pulse: 83 88 90 92  Resp: 16 16 18  16  Temp: 98.2 F (36.8 C) 98.4 F (36.9 C) 98.4 F (36.9 C) 98.6 F (37 C)  TempSrc: Oral Oral Oral Oral  SpO2: 100% 100% 100% 99%  Weight:      Height:        Intake/Output Summary (Last 24 hours) at 05/24/16 1239 Last data filed at 05/24/16 0600  Gross per 24 hour  Intake              1820 ml  Output                0 ml  Net             1820 ml   Filed Weights   05/22/16 1048 05/22/16 2145  Weight: 80.3 kg (177 lb) 78.5 kg (173 lb)    Exam  General: Well developed, well nourished, NAD, appears stated age  31: NCAT, scleral icterus, mucous membranes moist.   Cardiovascular: S1 S2 auscultated, RRR, no murmurs  Respiratory: Clear to auscultation bilaterally with equal chest rise  Abdomen: Soft, nontender, nondistended, + bowel sounds  Extremities: warm dry without cyanosis clubbing or edema  Neuro: AAOx3, nonfocal  Psych: Appropriate mood and affect.   Data Reviewed: I have personally reviewed following labs and imaging studies  CBC:  Recent Labs Lab 05/22/16 1736 05/23/16 0439 05/24/16 0453  WBC 11.1* 9.9 11.3*  HGB 11.7* 10.1* 10.4*  HCT 34.0* 29.6* 30.9*  MCV 77.6* 75.9* 76.3*  PLT 388 395 [id number]   Basic Metabolic Panel:  Recent Labs Lab 05/22/16 1736 05/23/16 0439 05/24/16 0453  NA 134* 136 140  K 3.9 4.0 3.3*  CL 101 106 109  CO2 20* 20* 20*  GLUCOSE 340* 399* 193*  BUN 29* 30* 21*  CREATININE 1.63* 1.40* 1.40*  CALCIUM 9.4 8.8* 8.6*   GFR: Estimated Creatinine Clearance: 37.5 mL/min (by C-G formula based on SCr of 1.4 mg/dL (H)). Liver Function Tests:  Recent Labs Lab 05/22/16 1736 05/24/16 0453  AST 177* 147*  ALT 213* 161*  ALKPHOS 623* 515*  BILITOT 7.6* 6.7*  PROT 9.1* 7.7  ALBUMIN 3.5 2.9*   No results for input(s): LIPASE, AMYLASE in the last 168 hours. No results for input(s): AMMONIA in the last 168 hours. Coagulation Profile:  Recent Labs Lab 05/23/16 0439  INR 1.02   Cardiac Enzymes: No results for input(s): CKTOTAL, CKMB, CKMBINDEX, TROPONINI in the last 168 hours. BNP (last 3 results) No results for input(s): PROBNP in the last 8760 hours. HbA1C: No results for input(s): HGBA1C in the last 72 hours. CBG:  Recent Labs Lab 05/23/16 1706 05/23/16 2000 05/23/16 2358 05/24/16 0851  05/24/16 1142  GLUCAP 258* 203* 223* 175* 154*   Lipid Profile: No results for input(s): CHOL, HDL, LDLCALC, TRIG, CHOLHDL, LDLDIRECT in the last 72 hours. Thyroid Function Tests: No results for input(s): TSH, T4TOTAL, FREET4, T3FREE, THYROIDAB in the last 72 hours. Anemia Panel: No results for input(s): VITAMINB12, FOLATE, FERRITIN, TIBC, IRON, RETICCTPCT in the last 72 hours. Urine analysis:    Component Value Date/Time   COLORURINE AMBER (A) 05/22/2016 1700   APPEARANCEUR CLOUDY (A) 05/22/2016 1700   LABSPEC 1.012 05/22/2016 1700   PHURINE 5.0 05/22/2016 1700   GLUCOSEU 150 (A) 05/22/2016 1700   HGBUR LARGE (A) 05/22/2016 1700   BILIRUBINUR SMALL (A) 05/22/2016 1700   KETONESUR NEGATIVE 05/22/2016 1700   PROTEINUR 100 (A) 05/22/2016 1700   UROBILINOGEN 0.2 12/03/2013 2155   NITRITE NEGATIVE 05/22/2016 1700   LEUKOCYTESUR LARGE (A) 05/22/2016 1700  Sepsis Labs: @LABRCNTIP (procalcitonin:4,lacticidven:4)  ) Recent Results (from the past 240 hour(s))  Urine culture     Status: Abnormal   Collection Time: 05/22/16  5:00 PM  Result Value Ref Range Status   Specimen Description URINE, RANDOM  Final   Special Requests NONE  Final   Culture MULTIPLE SPECIES PRESENT, SUGGEST RECOLLECTION (A)  Final   Report Status 05/24/2016 FINAL  Final      Radiology Studies: Ct Abdomen Pelvis Wo Contrast  Result Date: 05/22/2016 CLINICAL DATA:  Vaginal bleeding for 1 week EXAM: CT ABDOMEN AND PELVIS WITHOUT CONTRAST TECHNIQUE: Multidetector CT imaging of the abdomen and pelvis was performed following the standard protocol without IV contrast. COMPARISON:  12/09/2012 FINDINGS: Lower chest: Lung bases demonstrate no acute consolidation or pleural effusion. Normal heart size. Hepatobiliary: No focal hepatic abnormality. No biliary dilatation. Calcified gallstone. Possible wall thickening. Ill-defined soft tissue expansion of of the porta hepatis appears different from comparison CT. Pancreas: No  peripancreatic inflammation. No dilatation of pancreas duct. Spleen: Normal in size without focal abnormality. Adrenals/Urinary Tract: Adrenal glands are within normal limits. Punctate calcification upper pole right kidney. Punctate nonobstructing stone or mild vascular calcification mid left kidney. Bladder unremarkable. Stomach/Bowel: Stomach nonenlarged. Small hiatal hernia. No dilated small bowel. No colon wall thickening. Vascular/Lymphatic: Atherosclerotic calcifications. No aneurysm. Enlarged porta hepatis lymph nodes up to 11 mm. Mild retroperitoneal adenopathy unchanged. Reproductive: Calcified uterine fibroid. Mild right adnexal enlargement compared to prior Other: No free air or free fluid. Interim finding of soft tissue infiltration of the anterior pelvic fat. Multiple soft tissue nodules within the lower omentum and within the mesenteric fat. Musculoskeletal: No acute or suspicious bone lesions. IMPRESSION: 1. Interim finding of infiltrative soft tissue density within the anterior lower abdominal/upper pelvic fat with multiple omental, mesenteric and intraperitoneal nodules visualized. Findings would be concerning for metastatic disease. 2. Calcified uterine fibroids. Mild soft tissue enlargement of the right adnexa. Could consider correlation with pelvic ultrasound. 3. Ill-defined possible soft tissue expansion at the porta hepatis. Follow-up CT examination with contrast would be helpful for further evaluation. Calcified gallstone with possible wall thickening, could correlate with ultrasound. Mild enlarged lymph nodes at the porta hepatis. 4. Nonobstructing stone right kidney. Punctate stone or mild vascular calcification mid left kidney. Electronically Signed   By: Donavan Foil M.D.   On: 05/22/2016 22:45   US Abdomen Complete  Result Date: 05/22/2016 CLINICAL DATA:  Elevated LFTs, UTI EXAM: ABDOMEN ULTRASOUND COMPLETE COMPARISON:  12/03/2015 FINDINGS: Gallbladder: Large shadowing stone in the  gallbladder measuring 2.6 cm. Mild wall thickening at 4.3 mm. Negative sonographic Murphy's. Sludge is also present within the gallbladder. Common bile duct: Diameter: Normal at 3.5 mm. Liver: No focal lesion identified. Within normal limits in parenchymal echogenicity. IVC: No abnormality visualized. Pancreas: Visualized portion unremarkable. Spleen: Size and appearance within normal limits. Right Kidney: Length: 8.9 cm. Echogenicity within normal limits. No mass or hydronephrosis visualized. Lower pole obscured by bowel gas Left Kidney: Length: 10 cm. Echogenicity within normal limits. No mass or hydronephrosis visualized. Abdominal aorta: No aneurysm visualized. Other findings: None. IMPRESSION: 1. Hepatic echogenicity is within normal limits. No biliary dilatation 2. Large shadowing stone within the gallbladder with sludge also present. Mild wall thickening of the gallbladder is non specific. Negative sonographic Murphy's. Electronically Signed   By: Donavan Foil M.D.   On: 05/22/2016 20:08   Mr Abdomen Mrcp Wo Contrast  Result Date: 05/23/2016 CLINICAL DATA:  Elevated liver function tests. CT demonstrating gallstones and possible  soft tissue expansion in the porta hepatis. EXAM: MRI ABDOMEN WITHOUT CONTRAST  (INCLUDING MRCP) TECHNIQUE: Multiplanar multisequence MR imaging of the abdomen was performed. Heavily T2-weighted images of the biliary and pancreatic ducts were obtained, and three-dimensional MRCP images were rendered by post processing. COMPARISON:  CT of 1 day prior.  Ultrasound of 05/23/2015 FINDINGS: Lower chest: Normal heart size without pericardial or pleural effusion. Tiny hiatal hernia. Hepatobiliary: 2.4 cm gallstone. Suggestion of gallbladder wall thickening at the fundus, including on image 31/ series 10. Contiguous 2.5 cm pericholecystic hepatic lesion straddling the right and left lobes. This is moderately T2 hyperintense, irregular, with restricted diffusion. Example image 21/series 3.  Intrahepatic ducts either borderline prominent, including on image 21/ series 3. There is a suggestion of T2 hyperintensity along the portal triads, including on image 19/ series 3. This is nonspecific and could represent edema. The common duct becomes narrowed in the region of the porta hepatis. An ill-defined soft tissue signal measures 4.6 x 2.5 cm on image 23/ series 3. The more distal common duct is normal in caliber. This area of narrowed common duct is readily apparent on image 9/ series 400, image 65/series 4. Pancreas:  Normal, without mass or ductal dilatation. Spleen:  Normal in size, without focal abnormality. Adrenals/Urinary Tract: Normal adrenal glands. Normal kidneys, without hydronephrosis. Stomach/Bowel: Normal remainder of the stomach. Normal caliber of large and small bowel loops. Vascular/Lymphatic: Normal caliber of the aorta and branch vessels. No other abdominal adenopathy. Other: No ascites. Incompletely imaged omental thickening within the pelvis on image 30/series 10. Musculoskeletal: No acute osseous abnormality. IMPRESSION: 1. Borderline intrahepatic duct dilatation with narrowing of the common duct in the porta hepatis at the site of a soft tissue signal lesion which is likely infiltrative adenopathy. The more distal common duct is normal in caliber. 2. Gallstone with nonspecific gallbladder fundal wall thickening. Adjacent suspicious liver lesion. Considerations include metastatic disease from the patient's presumed primary versus direct extension of gallbladder carcinoma into the adjacent liver. Consider sampling of the liver lesion for staging. 3. Suggestion of extension of edema along the portal triads, nonspecific. Contrast-enhanced hepatic protocol CT may be informative. 4. Incompletely imaged omental thickening/nodularity as on CT. This is highly suspicious for peritoneal metastasis. Potential primaries include ovarian, gallbladder, or an otherwise unknown primary. Electronically  Signed   By: Abigail Miyamoto M.D.   On: 05/23/2016 21:58   Mr 3d Recon At Scanner  Result Date: 05/23/2016 CLINICAL DATA:  Elevated liver function tests. CT demonstrating gallstones and possible soft tissue expansion in the porta hepatis. EXAM: MRI ABDOMEN WITHOUT CONTRAST  (INCLUDING MRCP) TECHNIQUE: Multiplanar multisequence MR imaging of the abdomen was performed. Heavily T2-weighted images of the biliary and pancreatic ducts were obtained, and three-dimensional MRCP images were rendered by post processing. COMPARISON:  CT of 1 day prior.  Ultrasound of 05/23/2015 FINDINGS: Lower chest: Normal heart size without pericardial or pleural effusion. Tiny hiatal hernia. Hepatobiliary: 2.4 cm gallstone. Suggestion of gallbladder wall thickening at the fundus, including on image 31/ series 10. Contiguous 2.5 cm pericholecystic hepatic lesion straddling the right and left lobes. This is moderately T2 hyperintense, irregular, with restricted diffusion. Example image 21/series 3. Intrahepatic ducts either borderline prominent, including on image 21/ series 3. There is a suggestion of T2 hyperintensity along the portal triads, including on image 19/ series 3. This is nonspecific and could represent edema. The common duct becomes narrowed in the region of the porta hepatis. An ill-defined soft tissue signal measures 4.6 x  2.5 cm on image 23/ series 3. The more distal common duct is normal in caliber. This area of narrowed common duct is readily apparent on image 9/ series 400, image 65/series 4. Pancreas:  Normal, without mass or ductal dilatation. Spleen:  Normal in size, without focal abnormality. Adrenals/Urinary Tract: Normal adrenal glands. Normal kidneys, without hydronephrosis. Stomach/Bowel: Normal remainder of the stomach. Normal caliber of large and small bowel loops. Vascular/Lymphatic: Normal caliber of the aorta and branch vessels. No other abdominal adenopathy. Other: No ascites. Incompletely imaged omental  thickening within the pelvis on image 30/series 10. Musculoskeletal: No acute osseous abnormality. IMPRESSION: 1. Borderline intrahepatic duct dilatation with narrowing of the common duct in the porta hepatis at the site of a soft tissue signal lesion which is likely infiltrative adenopathy. The more distal common duct is normal in caliber. 2. Gallstone with nonspecific gallbladder fundal wall thickening. Adjacent suspicious liver lesion. Considerations include metastatic disease from the patient's presumed primary versus direct extension of gallbladder carcinoma into the adjacent liver. Consider sampling of the liver lesion for staging. 3. Suggestion of extension of edema along the portal triads, nonspecific. Contrast-enhanced hepatic protocol CT may be informative. 4. Incompletely imaged omental thickening/nodularity as on CT. This is highly suspicious for peritoneal metastasis. Potential primaries include ovarian, gallbladder, or an otherwise unknown primary. Electronically Signed   By: Abigail Miyamoto M.D.   On: 05/23/2016 21:58     Scheduled Meds: . amLODipine  10 mg Oral Daily  . irbesartan  300 mg Oral Daily   And  . hydrochlorothiazide  12.5 mg Oral Daily  . insulin aspart  0-5 Units Subcutaneous QHS  . insulin aspart  0-9 Units Subcutaneous TID WC  . insulin detemir  15 Units Subcutaneous QHS  . levofloxacin (LEVAQUIN) IV  750 mg Intravenous Q48H  . metronidazole  500 mg Intravenous Q8H   Continuous Infusions: . sodium chloride 100 mL/hr at 05/24/16 1054     LOS: 2 days   Time Spent in minutes   30 minutes  Shayn Madole D.O. on 05/24/2016 at 12:39 PM  Between 7am to 7pm - Pager - [phone]  After 7pm go to [email or web address] - password [email or web address]  And look for the night coverage person covering for me after hours  [facility] Office  [phone]

## 2016-05-24 NOTE — Progress Notes (Signed)
Patient ID: [name], female   DOB: 11/08/1940, 76 y.o.   MRN: [id number]  IR aware of poss bx request In review with Rad

## 2016-05-24 NOTE — Progress Notes (Addendum)
Hyperbilirubinemia  Subjective: Had some periumbilical pain overnight, MRCP done and concerning for malignancy  Objective: Vital signs in last 24 hours: Temp:  [98.2 F (36.8 C)-98.4 F (36.9 C)] 98.4 F (36.9 C) (01/13 0528) Pulse Rate:  [83-90] 90 (01/13 0528) Resp:  [16-18] 18 (01/13 0528) BP: (132-181)/(65-80) 140/80 (01/13 0528) SpO2:  [98 %-100 %] 100 % (01/13 0528) Last BM Date: 05/21/16  Intake/Output from previous day: 01/12 0701 - 01/13 0700 In: 2020 [P.O.:120; I.V.:1600; IV Piggyback:300] Out: -  Intake/Output this shift: No intake/output data recorded.  General appearance: alert and cooperative GI: soft, mild ttp in RUQ  Lab Results:  Results for orders placed or performed during the hospital encounter of 05/22/16 (from the past 24 hour(s))  CBG monitoring, ED     Status: Abnormal   Collection Time: 05/23/16 11:59 AM  Result Value Ref Range   Glucose-Capillary 228 (H) 65 - 99 mg/dL  Glucose, capillary     Status: Abnormal   Collection Time: 05/23/16  5:06 PM  Result Value Ref Range   Glucose-Capillary 258 (H) 65 - 99 mg/dL  Glucose, capillary     Status: Abnormal   Collection Time: 05/23/16  8:00 PM  Result Value Ref Range   Glucose-Capillary 203 (H) 65 - 99 mg/dL  Glucose, capillary     Status: Abnormal   Collection Time: 05/23/16 11:58 PM  Result Value Ref Range   Glucose-Capillary 223 (H) 65 - 99 mg/dL  Comprehensive metabolic panel     Status: Abnormal   Collection Time: 05/24/16  4:53 AM  Result Value Ref Range   Sodium 140 135 - 145 mmol/L   Potassium 3.3 (L) 3.5 - 5.1 mmol/L   Chloride 109 101 - 111 mmol/L   CO2 20 (L) 22 - 32 mmol/L   Glucose, Bld 193 (H) 65 - 99 mg/dL   BUN 21 (H) 6 - 20 mg/dL   Creatinine, Ser 1.40 (H) 0.44 - 1.00 mg/dL   Calcium 8.6 (L) 8.9 - 10.3 mg/dL   Total Protein 7.7 6.5 - 8.1 g/dL   Albumin 2.9 (L) 3.5 - 5.0 g/dL   AST 147 (H) 15 - 41 U/L   ALT 161 (H) 14 - 54 U/L   Alkaline Phosphatase 515 (H) 38 - 126 U/L   Total Bilirubin 6.7 (H) 0.3 - 1.2 mg/dL   GFR calc non Af Amer 36 (L) >60 mL/min   GFR calc Af Amer 41 (L) >60 mL/min   Anion gap 11 5 - 15  CBC     Status: Abnormal   Collection Time: 05/24/16  4:53 AM  Result Value Ref Range   WBC 11.3 (H) 4.0 - 10.5 K/uL   RBC 4.05 3.87 - 5.11 MIL/uL   Hemoglobin 10.4 (L) 12.0 - 15.0 g/dL   HCT 30.9 (L) 36.0 - 46.0 %   MCV 76.3 (L) 78.0 - 100.0 fL   MCH 25.7 (L) 26.0 - 34.0 pg   MCHC 33.7 30.0 - 36.0 g/dL   RDW 16.7 (H) 11.5 - 15.5 %   Platelets 357 150 - 400 K/uL  Glucose, capillary     Status: Abnormal   Collection Time: 05/24/16  8:51 AM  Result Value Ref Range   Glucose-Capillary 175 (H) 65 - 99 mg/dL     Studies/Results Radiology     MEDS, Scheduled . amLODipine  10 mg Oral Daily  . irbesartan  300 mg Oral Daily   And  . hydrochlorothiazide  12.5 mg Oral Daily  .  insulin aspart  0-5 Units Subcutaneous QHS  . insulin aspart  0-9 Units Subcutaneous TID WC  . insulin detemir  15 Units Subcutaneous QHS  . levofloxacin (LEVAQUIN) IV  750 mg Intravenous Q48H  . metronidazole  500 mg Intravenous Q8H  . potassium chloride  40 mEq Oral Once     Assessment: Hyperbilirubinemia Does not appear to be due to a benign cause No surgical plans right now  Plan: Further w/u per GI Will follow more peripherally going forward Diet per GI   LOS: 2 days    Rosario Adie, MD [facility], [place] [phone]   05/24/2016 9:38 AM

## 2016-05-24 NOTE — Progress Notes (Signed)
Subjective: Weak and poor appetite. No abdominal pain.  Objective: Vital signs in last 24 hours: Temp:  [98.2 F (36.8 C)-98.6 F (37 C)] 98.6 F (37 C) (01/13 1029) Pulse Rate:  [83-92] 92 (01/13 1029) Resp:  [16-18] 16 (01/13 1029) BP: (132-181)/(65-80) 147/72 (01/13 1029) SpO2:  [98 %-100 %] 99 % (01/13 1029) Weight change:  Last BM Date: 05/21/16  PE: GEN:  Weak- and deconditioned-appearing HEENT:  Scleral icterus ABD:  Soft, protuberant, non-tender  Lab Results: CBC    Component Value Date/Time   WBC 11.3 (H) 05/24/2016 0453   RBC 4.05 05/24/2016 0453   HGB 10.4 (L) 05/24/2016 0453   HCT 30.9 (L) 05/24/2016 0453   PLT 357 05/24/2016 0453   MCV 76.3 (L) 05/24/2016 0453   MCH 25.7 (L) 05/24/2016 0453   MCHC 33.7 05/24/2016 0453   RDW 16.7 (H) 05/24/2016 0453   LYMPHSABS 4.0 08/17/2015 1915   MONOABS 0.7 08/17/2015 1915   EOSABS 0.2 08/17/2015 1915   BASOSABS 0.0 08/17/2015 1915   CBC    Component Value Date/Time   WBC 11.3 (H) 05/24/2016 0453   RBC 4.05 05/24/2016 0453   HGB 10.4 (L) 05/24/2016 0453   HCT 30.9 (L) 05/24/2016 0453   PLT 357 05/24/2016 0453   MCV 76.3 (L) 05/24/2016 0453   MCH 25.7 (L) 05/24/2016 0453   MCHC 33.7 05/24/2016 0453   RDW 16.7 (H) 05/24/2016 0453   LYMPHSABS 4.0 08/17/2015 1915   MONOABS 0.7 08/17/2015 1915   EOSABS 0.2 08/17/2015 1915   BASOSABS 0.0 08/17/2015 1915    Studies/Results:  MRI/MRCP:  Personally reviewed  Assessment:  1.  Jaundice.  Small downtrend on today's LFTs.  Borderline biliary ductal dilatation based on my review of MRCP images; in fact, the MRCP appearance to bile duct looks PSC-like.  Imaging suspicious for gallbladder or bile duct malignancy with widespread metastases. 2.  Abnormal imaging (CT and MRCP).  Porta hepatis mass; gallbladder thickening; right pericholecystic lesion; peritoneal implants; omental nodularity. 3.  Elevated LFTs, likely from #1 and #2 above, slight downtrend overnight.  Borderline biliary ductal dilatation seen upon my review of MRCP images, upstream of focal stricture of proximal extrahepatic bile duct.  Plan:  1.  Check CEA and Ca 19-9.  Check INR. 2.  CT- or US-guided biopsy of pericholecystic liver lesion, which appears amenable to percutaneous biopsy.  Hospitalists will consult Interventional Radiology. 3.  Pending IR biopsy results and evolution of LFTs, might consider ERCP early-mid next week for stenting. 4.  Full liquid diet OK today. 5.  [facility] will follow; case discussed with Dr. Ree Kida.   Landry Dyke 05/24/2016, 11:17 AM   Pager [phone] If no answer or after 5 PM call [phone]

## 2016-05-25 DIAGNOSIS — R160 Hepatomegaly, not elsewhere classified: Secondary | ICD-10-CM

## 2016-05-25 DIAGNOSIS — E1165 Type 2 diabetes mellitus with hyperglycemia: Secondary | ICD-10-CM

## 2016-05-25 DIAGNOSIS — E118 Type 2 diabetes mellitus with unspecified complications: Secondary | ICD-10-CM

## 2016-05-25 DIAGNOSIS — K7689 Other specified diseases of liver: Secondary | ICD-10-CM

## 2016-05-25 LAB — COMPREHENSIVE METABOLIC PANEL
ALK PHOS: 490 U/L — AB (ref 38–126)
ALT: 141 U/L — ABNORMAL HIGH (ref 14–54)
ANION GAP: 10 (ref 5–15)
AST: 144 U/L — ABNORMAL HIGH (ref 15–41)
Albumin: 2.8 g/dL — ABNORMAL LOW (ref 3.5–5.0)
BILIRUBIN TOTAL: 6.8 mg/dL — AB (ref 0.3–1.2)
BUN: 16 mg/dL (ref 6–20)
CALCIUM: 8.5 mg/dL — AB (ref 8.9–10.3)
CO2: 19 mmol/L — ABNORMAL LOW (ref 22–32)
Chloride: 108 mmol/L (ref 101–111)
Creatinine, Ser: 1.3 mg/dL — ABNORMAL HIGH (ref 0.44–1.00)
GFR calc non Af Amer: 39 mL/min — ABNORMAL LOW (ref >60)
GFR, EST AFRICAN AMERICAN: 45 mL/min — AB (ref >60)
GLUCOSE: 183 mg/dL — AB (ref 65–99)
Potassium: 3.6 mmol/L (ref 3.5–5.1)
Sodium: 137 mmol/L (ref 135–145)
Total Protein: 7.5 g/dL (ref 6.5–8.1)

## 2016-05-25 LAB — CBC
HCT: 30.6 % — ABNORMAL LOW (ref 36.0–46.0)
HEMOGLOBIN: 10.4 g/dL — AB (ref 12.0–15.0)
MCH: 25.8 pg — ABNORMAL LOW (ref 26.0–34.0)
MCHC: 34 g/dL (ref 30.0–36.0)
MCV: 75.9 fL — ABNORMAL LOW (ref 78.0–100.0)
Platelets: 366 10*3/uL (ref 150–400)
RBC: 4.03 MIL/uL (ref 3.87–5.11)
RDW: 17.2 % — ABNORMAL HIGH (ref 11.5–15.5)
WBC: 12.4 10*3/uL — ABNORMAL HIGH (ref 4.0–10.5)

## 2016-05-25 LAB — GLUCOSE, CAPILLARY
GLUCOSE-CAPILLARY: 177 mg/dL — AB (ref 65–99)
Glucose-Capillary: 254 mg/dL — ABNORMAL HIGH (ref 65–99)
Glucose-Capillary: 229 mg/dL — ABNORMAL HIGH (ref 65–99)
Glucose-Capillary: 207 mg/dL — ABNORMAL HIGH (ref 65–99)

## 2016-05-25 LAB — PROTIME-INR
INR: 1.11
Prothrombin Time: 14.4 seconds (ref 11.4–15.2)

## 2016-05-25 NOTE — Consult Note (Signed)
Chief Complaint: Patient was seen in consultation today for omental thickening biopsy Chief Complaint  Patient presents with  . Vaginal Bleeding   at the request of Dr Curly Shores  Referring Physician(s): Dr Curly Shores Dr Margurite Auerbach  Supervising Physician: Sandi Mariscal  Patient Status: [facility] - In-pt  History of Present Illness: [name] is a 76 y.o. female   Jaundice Wt loss abd pain; weakness Elevated LFTs  MRCP: 1/12 IMPRESSION: 1. Borderline intrahepatic duct dilatation with narrowing of the common duct in the porta hepatis at the site of a soft tissue signal lesion which is likely infiltrative adenopathy. The more distal common duct is normal in caliber. 2. Gallstone with nonspecific gallbladder fundal wall thickening. Adjacent suspicious liver lesion. Considerations include metastatic disease from the patient's presumed primary versus direct extension of gallbladder carcinoma into the adjacent liver. Consider sampling of the liver lesion for staging. 3. Suggestion of extension of edema along the portal triads, nonspecific. Contrast-enhanced hepatic protocol CT may be informative. 4. Incompletely imaged omental thickening/nodularity as on CT. This is highly suspicious for peritoneal metastasis. Potential primaries include ovarian, gallbladder, or an otherwise unknown primary  Request for biopsy/tissue diagnosis Dr Pascal Lux has reviewed imaging and approves omental thickening biopsy   Past Medical History:  Diagnosis Date  . Diabetes mellitus without complication ([place])   . Hypertension   . 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 CYSTECTOMY    . TUBAL LIGATION      Allergies: Rosiglitazone maleate; Metformin; Penicillins; and Pioglitazone  Medications: Prior to Admission medications   Medication Sig Start Date End Date Taking? Authorizing Provider  aspirin EC 81 MG tablet Take 81 mg by mouth  daily.   Yes Historical Provider, MD  cholecalciferol (VITAMIN D) 1000 UNITS tablet Take 1,000 Units by mouth daily.    Yes Historical Provider, MD  furosemide (LASIX) 40 MG tablet Take 40 mg by mouth daily.    Yes Historical Provider, MD  HUMALOG MIX 50/50 KWIKPEN (50-50) 100 UNIT/ML Kwikpen Inject 10-40 Units into the skin 2 (two) times daily. Inject 40 units under the skin before breakfast and 10 units under the skin before evening meal. 05/13/16  Yes Historical Provider, MD  Multiple Vitamin (MULTIVITAMIN WITH MINERALS) TABS tablet Take 1 tablet by mouth daily.   Yes Historical Provider, MD  Olmesartan-Amlodipine-HCTZ 40-10-12.5 MG TABS Take 1 tablet by mouth daily. 04/14/16  Yes Historical Provider, MD  Omega-3 Fatty Acids (FISH OIL PO) Take 1 capsule by mouth daily.   Yes Historical Provider, MD     Family History  Problem Relation Age of Onset  . Cancer Mother   . Diabetes Father     Social History   Social History  . Marital status: Married    Spouse name: N/A  . Number of children: N/A  . Years of education: N/A   Social History Main Topics  . Smoking status: Former Research scientist (life sciences)  . Smokeless tobacco: Never Used  . Alcohol use No  . Drug use: No  . Sexual activity: Not Asked   Other Topics Concern  . None   Social History Narrative  . None    Review of Systems: A 12 point ROS discussed and pertinent positives are indicated in the HPI above.  All other systems are negative.  Review of Systems  Constitutional: Positive for activity change, appetite change, fatigue and unexpected weight change. Negative for fever.  Respiratory: Negative for shortness  of breath.   Gastrointestinal: Positive for abdominal pain and nausea.  Neurological: Positive for weakness.  Psychiatric/Behavioral: Negative for behavioral problems and confusion.    Vital Signs: BP (!) 156/77 (BP Location: Right Arm)   Pulse 95   Temp 98.5 F (36.9 C) (Oral)   Resp 18   Ht 5\' 7"  (1.702 m)   Wt 173 lb  (78.5 kg)   SpO2 100%   BMI 27.10 kg/m   Physical Exam  Constitutional: She is oriented to person, place, and time.  Cardiovascular: Normal rate, regular rhythm and normal heart sounds.   Pulmonary/Chest: Effort normal and breath sounds normal.  Abdominal: Soft. Bowel sounds are normal. There is tenderness.  Musculoskeletal: Normal range of motion.  Neurological: She is alert and oriented to person, place, and time.  Skin: Skin is warm and dry.  Psychiatric: She has a normal mood and affect. Her behavior is normal. Judgment and thought content normal.  Nursing note and vitals reviewed.   Mallampati Score:  MD Evaluation Airway: WNL Heart: WNL Abdomen: WNL Chest/ Lungs: WNL ASA  Classification: 3 Mallampati/Airway Score: One  Imaging: Ct Abdomen Pelvis Wo Contrast  Result Date: 05/22/2016 CLINICAL DATA:  Vaginal bleeding for 1 week EXAM: CT ABDOMEN AND PELVIS WITHOUT CONTRAST TECHNIQUE: Multidetector CT imaging of the abdomen and pelvis was performed following the standard protocol without IV contrast. COMPARISON:  12/09/2012 FINDINGS: Lower chest: Lung bases demonstrate no acute consolidation or pleural effusion. Normal heart size. Hepatobiliary: No focal hepatic abnormality. No biliary dilatation. Calcified gallstone. Possible wall thickening. Ill-defined soft tissue expansion of of the porta hepatis appears different from comparison CT. Pancreas: No peripancreatic inflammation. No dilatation of pancreas duct. Spleen: Normal in size without focal abnormality. Adrenals/Urinary Tract: Adrenal glands are within normal limits. Punctate calcification upper pole right kidney. Punctate nonobstructing stone or mild vascular calcification mid left kidney. Bladder unremarkable. Stomach/Bowel: Stomach nonenlarged. Small hiatal hernia. No dilated small bowel. No colon wall thickening. Vascular/Lymphatic: Atherosclerotic calcifications. No aneurysm. Enlarged porta hepatis lymph nodes up to 11 mm.  Mild retroperitoneal adenopathy unchanged. Reproductive: Calcified uterine fibroid. Mild right adnexal enlargement compared to prior Other: No free air or free fluid. Interim finding of soft tissue infiltration of the anterior pelvic fat. Multiple soft tissue nodules within the lower omentum and within the mesenteric fat. Musculoskeletal: No acute or suspicious bone lesions. IMPRESSION: 1. Interim finding of infiltrative soft tissue density within the anterior lower abdominal/upper pelvic fat with multiple omental, mesenteric and intraperitoneal nodules visualized. Findings would be concerning for metastatic disease. 2. Calcified uterine fibroids. Mild soft tissue enlargement of the right adnexa. Could consider correlation with pelvic ultrasound. 3. Ill-defined possible soft tissue expansion at the porta hepatis. Follow-up CT examination with contrast would be helpful for further evaluation. Calcified gallstone with possible wall thickening, could correlate with ultrasound. Mild enlarged lymph nodes at the porta hepatis. 4. Nonobstructing stone right kidney. Punctate stone or mild vascular calcification mid left kidney. Electronically Signed   By: Donavan Foil M.D.   On: 05/22/2016 22:45   US Abdomen Complete  Result Date: 05/22/2016 CLINICAL DATA:  Elevated LFTs, UTI EXAM: ABDOMEN ULTRASOUND COMPLETE COMPARISON:  12/03/2015 FINDINGS: Gallbladder: Large shadowing stone in the gallbladder measuring 2.6 cm. Mild wall thickening at 4.3 mm. Negative sonographic Murphy's. Sludge is also present within the gallbladder. Common bile duct: Diameter: Normal at 3.5 mm. Liver: No focal lesion identified. Within normal limits in parenchymal echogenicity. IVC: No abnormality visualized. Pancreas: Visualized portion unremarkable. Spleen: Size and appearance  within normal limits. Right Kidney: Length: 8.9 cm. Echogenicity within normal limits. No mass or hydronephrosis visualized. Lower pole obscured by bowel gas Left Kidney:  Length: 10 cm. Echogenicity within normal limits. No mass or hydronephrosis visualized. Abdominal aorta: No aneurysm visualized. Other findings: None. IMPRESSION: 1. Hepatic echogenicity is within normal limits. No biliary dilatation 2. Large shadowing stone within the gallbladder with sludge also present. Mild wall thickening of the gallbladder is non specific. Negative sonographic Murphy's. Electronically Signed   By: Donavan Foil M.D.   On: 05/22/2016 20:08   Mr Abdomen Mrcp Wo Contrast  Result Date: 05/23/2016 CLINICAL DATA:  Elevated liver function tests. CT demonstrating gallstones and possible soft tissue expansion in the porta hepatis. EXAM: MRI ABDOMEN WITHOUT CONTRAST  (INCLUDING MRCP) TECHNIQUE: Multiplanar multisequence MR imaging of the abdomen was performed. Heavily T2-weighted images of the biliary and pancreatic ducts were obtained, and three-dimensional MRCP images were rendered by post processing. COMPARISON:  CT of 1 day prior.  Ultrasound of 05/23/2015 FINDINGS: Lower chest: Normal heart size without pericardial or pleural effusion. Tiny hiatal hernia. Hepatobiliary: 2.4 cm gallstone. Suggestion of gallbladder wall thickening at the fundus, including on image 31/ series 10. Contiguous 2.5 cm pericholecystic hepatic lesion straddling the right and left lobes. This is moderately T2 hyperintense, irregular, with restricted diffusion. Example image 21/series 3. Intrahepatic ducts either borderline prominent, including on image 21/ series 3. There is a suggestion of T2 hyperintensity along the portal triads, including on image 19/ series 3. This is nonspecific and could represent edema. The common duct becomes narrowed in the region of the porta hepatis. An ill-defined soft tissue signal measures 4.6 x 2.5 cm on image 23/ series 3. The more distal common duct is normal in caliber. This area of narrowed common duct is readily apparent on image 9/ series 400, image 65/series 4. Pancreas:  Normal,  without mass or ductal dilatation. Spleen:  Normal in size, without focal abnormality. Adrenals/Urinary Tract: Normal adrenal glands. Normal kidneys, without hydronephrosis. Stomach/Bowel: Normal remainder of the stomach. Normal caliber of large and small bowel loops. Vascular/Lymphatic: Normal caliber of the aorta and branch vessels. No other abdominal adenopathy. Other: No ascites. Incompletely imaged omental thickening within the pelvis on image 30/series 10. Musculoskeletal: No acute osseous abnormality. IMPRESSION: 1. Borderline intrahepatic duct dilatation with narrowing of the common duct in the porta hepatis at the site of a soft tissue signal lesion which is likely infiltrative adenopathy. The more distal common duct is normal in caliber. 2. Gallstone with nonspecific gallbladder fundal wall thickening. Adjacent suspicious liver lesion. Considerations include metastatic disease from the patient's presumed primary versus direct extension of gallbladder carcinoma into the adjacent liver. Consider sampling of the liver lesion for staging. 3. Suggestion of extension of edema along the portal triads, nonspecific. Contrast-enhanced hepatic protocol CT may be informative. 4. Incompletely imaged omental thickening/nodularity as on CT. This is highly suspicious for peritoneal metastasis. Potential primaries include ovarian, gallbladder, or an otherwise unknown primary. Electronically Signed   By: Abigail Miyamoto M.D.   On: 05/23/2016 21:58   Mr 3d Recon At Scanner  Result Date: 05/23/2016 CLINICAL DATA:  Elevated liver function tests. CT demonstrating gallstones and possible soft tissue expansion in the porta hepatis. EXAM: MRI ABDOMEN WITHOUT CONTRAST  (INCLUDING MRCP) TECHNIQUE: Multiplanar multisequence MR imaging of the abdomen was performed. Heavily T2-weighted images of the biliary and pancreatic ducts were obtained, and three-dimensional MRCP images were rendered by post processing. COMPARISON:  CT of 1 day  prior.  Ultrasound of 05/23/2015 FINDINGS: Lower chest: Normal heart size without pericardial or pleural effusion. Tiny hiatal hernia. Hepatobiliary: 2.4 cm gallstone. Suggestion of gallbladder wall thickening at the fundus, including on image 31/ series 10. Contiguous 2.5 cm pericholecystic hepatic lesion straddling the right and left lobes. This is moderately T2 hyperintense, irregular, with restricted diffusion. Example image 21/series 3. Intrahepatic ducts either borderline prominent, including on image 21/ series 3. There is a suggestion of T2 hyperintensity along the portal triads, including on image 19/ series 3. This is nonspecific and could represent edema. The common duct becomes narrowed in the region of the porta hepatis. An ill-defined soft tissue signal measures 4.6 x 2.5 cm on image 23/ series 3. The more distal common duct is normal in caliber. This area of narrowed common duct is readily apparent on image 9/ series 400, image 65/series 4. Pancreas:  Normal, without mass or ductal dilatation. Spleen:  Normal in size, without focal abnormality. Adrenals/Urinary Tract: Normal adrenal glands. Normal kidneys, without hydronephrosis. Stomach/Bowel: Normal remainder of the stomach. Normal caliber of large and small bowel loops. Vascular/Lymphatic: Normal caliber of the aorta and branch vessels. No other abdominal adenopathy. Other: No ascites. Incompletely imaged omental thickening within the pelvis on image 30/series 10. Musculoskeletal: No acute osseous abnormality. IMPRESSION: 1. Borderline intrahepatic duct dilatation with narrowing of the common duct in the porta hepatis at the site of a soft tissue signal lesion which is likely infiltrative adenopathy. The more distal common duct is normal in caliber. 2. Gallstone with nonspecific gallbladder fundal wall thickening. Adjacent suspicious liver lesion. Considerations include metastatic disease from the patient's presumed primary versus direct extension  of gallbladder carcinoma into the adjacent liver. Consider sampling of the liver lesion for staging. 3. Suggestion of extension of edema along the portal triads, nonspecific. Contrast-enhanced hepatic protocol CT may be informative. 4. Incompletely imaged omental thickening/nodularity as on CT. This is highly suspicious for peritoneal metastasis. Potential primaries include ovarian, gallbladder, or an otherwise unknown primary. Electronically Signed   By: Abigail Miyamoto M.D.   On: 05/23/2016 21:58    Labs:  CBC:  Recent Labs  05/22/16 1736 05/23/16 0439 05/24/16 0453 05/25/16 0437  WBC 11.1* 9.9 11.3* 12.4*  HGB 11.7* 10.1* 10.4* 10.4*  HCT 34.0* 29.6* 30.9* 30.6*  PLT 388 395 357 366    COAGS:  Recent Labs  05/23/16 0439 05/25/16 0437  INR 1.02 1.11    BMP:  Recent Labs  05/22/16 1736 05/23/16 0439 05/24/16 0453 05/25/16 0437  NA 134* 136 140 137  K 3.9 4.0 3.3* 3.6  CL 101 106 109 108  CO2 20* 20* 20* 19*  GLUCOSE 340* 399* 193* 183*  BUN 29* 30* 21* 16  CALCIUM 9.4 8.8* 8.6* 8.5*  CREATININE 1.63* 1.40* 1.40* 1.30*  GFRNONAA 30* 36* 36* 39*  GFRAA 34* 41* 41* 45*    LIVER FUNCTION TESTS:  Recent Labs  05/22/16 1736 05/24/16 0453 05/25/16 0437  BILITOT 7.6* 6.7* 6.8*  AST 177* 147* 144*  ALT 213* 161* 141*  ALKPHOS 623* 515* 490*  PROT 9.1* 7.7 7.5  ALBUMIN 3.5 2.9* 2.8*    TUMOR MARKERS: No results for input(s): AFPTM, CEA, CA199, CHROMGRNA in the last 8760 hours.  Assessment and Plan:  Jaundice Abnormal MRCP Now for omental; thickening biopsy 1/15 in Radiology Risks and Benefits discussed with the patient including, but not limited to bleeding, infection, damage to adjacent structures or low yield requiring additional tests. All of the patient's questions  were answered, patient is agreeable to proceed. Consent signed and in chart.   Thank you for this interesting consult.  I greatly enjoyed meeting [place] and look forward to  participating in their care.  A copy of this report was sent to the requesting provider on this date.  Electronically Signed: Monia Sabal A 05/25/2016, 10:58 AM   I spent a total of 40 Minutes    in face to face in clinical consultation, greater than 50% of which was counseling/coordinating care for omental thickening biopsy

## 2016-05-25 NOTE — Progress Notes (Signed)
Subjective: No complaints.  Objective: Vital signs in last 24 hours: Temp:  [98.2 F (36.8 C)-98.5 F (36.9 C)] 98.5 F (36.9 C) (01/13 2317) Pulse Rate:  [95-98] 95 (01/13 2317) Resp:  [16-18] 18 (01/13 2317) BP: (156-179)/(77-81) 156/77 (01/13 2317) SpO2:  [100 %] 100 % (01/13 2317) Weight change:  Last BM Date: 05/21/16  PE: GEN:  NAD HEENT:  Scleral icterus NEURO:  Alert and oriented  Lab Results: CBC    Component Value Date/Time   WBC 12.4 (H) 05/25/2016 0437   RBC 4.03 05/25/2016 0437   HGB 10.4 (L) 05/25/2016 0437   HCT 30.6 (L) 05/25/2016 0437   PLT 366 05/25/2016 0437   MCV 75.9 (L) 05/25/2016 0437   MCH 25.8 (L) 05/25/2016 0437   MCHC 34.0 05/25/2016 0437   RDW 17.2 (H) 05/25/2016 0437   LYMPHSABS 4.0 08/17/2015 1915   MONOABS 0.7 08/17/2015 1915   EOSABS 0.2 08/17/2015 1915   BASOSABS 0.0 08/17/2015 1915   CMP     Component Value Date/Time   NA 137 05/25/2016 0437   K 3.6 05/25/2016 0437   CL 108 05/25/2016 0437   CO2 19 (L) 05/25/2016 0437   GLUCOSE 183 (H) 05/25/2016 0437   BUN 16 05/25/2016 0437   CREATININE 1.30 (H) 05/25/2016 0437   CALCIUM 8.5 (L) 05/25/2016 0437   PROT 7.5 05/25/2016 0437   ALBUMIN 2.8 (L) 05/25/2016 0437   AST 144 (H) 05/25/2016 0437   ALT 141 (H) 05/25/2016 0437   ALKPHOS 490 (H) 05/25/2016 0437   BILITOT 6.8 (H) 05/25/2016 0437   GFRNONAA 39 (L) 05/25/2016 0437   GFRAA 45 (L) 05/25/2016 0437   INR 1.11  Assessment:  1.  Jaundice.  LFTs slight improved since admission.  Borderline biliary ductal dilatation based on my review of MRCP images; in fact, the MRCP appearance to bile duct looks PSC-like.  Imaging suspicious for gallbladder or bile duct malignancy with widespread metastases. 2.  Abnormal imaging (CT and MRCP).  Porta hepatis mass; gallbladder thickening; right pericholecystic lesion; peritoneal implants; omental nodularity. 3.  Elevated LFTs, likely from #1 and #2 above, slight downtrend since admission.  Borderline biliary ductal dilatation seen upon my review of MRCP images, upstream of focal stricture of proximal extrahepatic bile duct.  Plan:  1.  Interventional Radiology consulted for percutaneous biopsy (omentum planned; liver lesion seems accessible as well). 2.  Awaiting CEA and Ca 19-9. 3.  Consider ERCP mid-next week, pending biopsy findings and LFT trends. 4.  [facility] will follow.   Landry Dyke 05/25/2016, 11:30 AM   Pager [phone] If no answer or after 5 PM call [phone]

## 2016-05-25 NOTE — Progress Notes (Signed)
PROGRESS NOTE    [name]  [id number] DOB: 1940-09-11 DOA: 05/22/2016 PCP: Lilian Coma, MD   Chief Complaint  Patient presents with  . Vaginal Bleeding    Brief Narrative:  HPI On 05/22/2016 by Dr. Lily Kocher  [name] is a 76 y.o. woman with a history of HTN, DM, and CKD 3 who presents to the ED with multiple complaints, including abnormal vaginal bleeding, dysuria (with possible hematuria), and rectal pain with bowel movements (though she denies constipation or the need to strain).  She has not seen any blood in her stool.  She denies associated fever.  No abdominal pain.  No nausea or vomiting, but she reports decreased appetite and 5 lb weight loss since Christmas.  No headache.  No chest pain or shortness of breath.  She has had increased weakness. Assessment & Plan   Abnormal LFTs /hyperbilirubinemia -with abnormal gallbladder appearance on CT.  She has had weight loss and loss of appetite; otherwise, asymptomatic. -Gastroenterology and general surgery consulted and appreciated -Started empirically on Levaquin, Flagyl -Given abnormal findings on CT scan with weight loss as well as vaginal bleeding, and for cancer -pending hepatitis panel -MRCP and abdominal MRI: Borderline intrahepatic duct dilatation, narrowing of common bile duct in the porta hepatis at the site of soft tissue signal lesion likely infiltrative adenopathy. Gallstones with nonspecific gallbladder fundal wall thickening suspicious for liver lesion -Spoke with general surgery, no role for surgery at this time. -Spoke with GI, recommended biopsy, possible ERCP with stenting next week. -Interventional radiology consulted for biopsy- omental -CEA and CA19-9 pending  Essential hypertension -Continue amlodipine, chlorothiazide, Avapro  Diabetes mellitus -Continue complaints scale, Levemir, CBG monitoring  Chronic kidney disease, stage III -Patient noted to have GFR in stage III 2015,  mild dip in 2017, currently stage III -Creatinine currently 1.3, continue monitor CMP  Hypokalemia -Replaced, continue to monitor CMP  Possible UTI -Urine culture showed multiple species -Continue Levaquin  Abnormal vaginal bleeding -Possibly related to the above -Outpatient follow up -Monitor CBC -Hemoglobin currently 10.4  Leukocytosis  -Possibly reactive vs ?UTI -patient is currently on levaquin and flagyl -Continue to monitor   DVT Prophylaxis  SCDs  Code Status: Full  Family Communication: None at bedside.   Disposition Plan: Admitted. Pending further workup- biopsy on 05/26/2016  Consultants Gastroenterology, [facility] General surgery Interventional radiology  Procedures  Abdominal US MRCP  Antibiotics   Anti-infectives    Start     Dose/Rate Route Frequency Ordered Stop   05/23/16 0400  levofloxacin (LEVAQUIN) IVPB 750 mg     750 mg 100 mL/hr over 90 Minutes Intravenous Every 48 hours 05/23/16 0119     05/23/16 0026  metroNIDAZOLE (FLAGYL) IVPB 500 mg     500 mg 100 mL/hr over 60 Minutes Intravenous Every 8 hours 05/23/16 0026     05/22/16 2045  metroNIDAZOLE (FLAGYL) IVPB 500 mg     500 mg 100 mL/hr over 60 Minutes Intravenous  Once 05/22/16 2043 05/23/16 0042   05/22/16 1930  ciprofloxacin (CIPRO) tablet 500 mg     500 mg Oral  Once 05/22/16 1924 05/22/16 2019      Subjective:   [name] seen and examined today. Patient denies current abdominal pain, nausea, vomiting, chest pain, shortness of breath, chest pain, dizziness, headache. Concerned about her possible diagnosis of cancer.   Objective:   Vitals:   05/24/16 1029 05/24/16 2156 05/24/16 2317 05/25/16 1140  BP: (!) 147/72 (!) 179/81 (!) 156/77 Marland Kitchen)  144/92  Pulse: 92 98 95 90  Resp: 16 16 18 18   Temp: 98.6 F (37 C) 98.2 F (36.8 C) 98.5 F (36.9 C) 98.3 F (36.8 C)  TempSrc: Oral Oral Oral Oral  SpO2: 99% 100% 100% 99%  Weight:      Height:        Intake/Output Summary  (Last 24 hours) at 05/25/16 1337 Last data filed at 05/25/16 0600  Gross per 24 hour  Intake             1350 ml  Output              200 ml  Net             1150 ml   Filed Weights   05/22/16 1048 05/22/16 2145  Weight: 80.3 kg (177 lb) 78.5 kg (173 lb)    Exam  General: Well developed, well nourished, NAD, appears stated age  30: NCAT, scleral icterus, mucous membranes moist.   Cardiovascular: S1 S2 auscultated, RRR, no murmurs  Respiratory: Clear to auscultation bilaterally with equal chest rise  Abdomen: Soft, nontender, nondistended, + bowel sounds  Extremities: warm dry without cyanosis clubbing or edema  Neuro: AAOx3, nonfocal  Psych: Appropriate mood and affect.   Data Reviewed: I have personally reviewed following labs and imaging studies  CBC:  Recent Labs Lab 05/22/16 1736 05/23/16 0439 05/24/16 0453 05/25/16 0437  WBC 11.1* 9.9 11.3* 12.4*  HGB 11.7* 10.1* 10.4* 10.4*  HCT 34.0* 29.6* 30.9* 30.6*  MCV 77.6* 75.9* 76.3* 75.9*  PLT 388 395 357 [id number]   Basic Metabolic Panel:  Recent Labs Lab 05/22/16 1736 05/23/16 0439 05/24/16 0453 05/25/16 0437  NA 134* 136 140 137  K 3.9 4.0 3.3* 3.6  CL 101 106 109 108  CO2 20* 20* 20* 19*  GLUCOSE 340* 399* 193* 183*  BUN 29* 30* 21* 16  CREATININE 1.63* 1.40* 1.40* 1.30*  CALCIUM 9.4 8.8* 8.6* 8.5*   GFR: Estimated Creatinine Clearance: 40.4 mL/min (by C-G formula based on SCr of 1.3 mg/dL (H)). Liver Function Tests:  Recent Labs Lab 05/22/16 1736 05/24/16 0453 05/25/16 0437  AST 177* 147* 144*  ALT 213* 161* 141*  ALKPHOS 623* 515* 490*  BILITOT 7.6* 6.7* 6.8*  PROT 9.1* 7.7 7.5  ALBUMIN 3.5 2.9* 2.8*   No results for input(s): LIPASE, AMYLASE in the last 168 hours. No results for input(s): AMMONIA in the last 168 hours. Coagulation Profile:  Recent Labs Lab 05/23/16 0439 05/25/16 0437  INR 1.02 1.11   Cardiac Enzymes: No results for input(s): CKTOTAL, CKMB, CKMBINDEX,  TROPONINI in the last 168 hours. BNP (last 3 results) No results for input(s): PROBNP in the last 8760 hours. HbA1C: No results for input(s): HGBA1C in the last 72 hours. CBG:  Recent Labs Lab 05/24/16 1142 05/24/16 1810 05/24/16 2151 05/25/16 0821 05/25/16 1244  GLUCAP 154* 188* 304* 177* 254*   Lipid Profile: No results for input(s): CHOL, HDL, LDLCALC, TRIG, CHOLHDL, LDLDIRECT in the last 72 hours. Thyroid Function Tests: No results for input(s): TSH, T4TOTAL, FREET4, T3FREE, THYROIDAB in the last 72 hours. Anemia Panel: No results for input(s): VITAMINB12, FOLATE, FERRITIN, TIBC, IRON, RETICCTPCT in the last 72 hours. Urine analysis:    Component Value Date/Time   COLORURINE AMBER (A) 05/22/2016 1700   APPEARANCEUR CLOUDY (A) 05/22/2016 1700   LABSPEC 1.012 05/22/2016 1700   PHURINE 5.0 05/22/2016 1700   GLUCOSEU 150 (A) 05/22/2016 1700   HGBUR LARGE (A) 05/22/2016  Philip (A) 05/22/2016 1700   KETONESUR NEGATIVE 05/22/2016 1700   PROTEINUR 100 (A) 05/22/2016 1700   UROBILINOGEN 0.2 12/03/2013 2155   NITRITE NEGATIVE 05/22/2016 1700   LEUKOCYTESUR LARGE (A) 05/22/2016 1700   Sepsis Labs: @LABRCNTIP (procalcitonin:4,lacticidven:4)  ) Recent Results (from the past 240 hour(s))  Urine culture     Status: Abnormal   Collection Time: 05/22/16  5:00 PM  Result Value Ref Range Status   Specimen Description URINE, RANDOM  Final   Special Requests NONE  Final   Culture MULTIPLE SPECIES PRESENT, SUGGEST RECOLLECTION (A)  Final   Report Status 05/24/2016 FINAL  Final      Radiology Studies: Mr Abdomen Mrcp Wo Contrast  Result Date: 05/23/2016 CLINICAL DATA:  Elevated liver function tests. CT demonstrating gallstones and possible soft tissue expansion in the porta hepatis. EXAM: MRI ABDOMEN WITHOUT CONTRAST  (INCLUDING MRCP) TECHNIQUE: Multiplanar multisequence MR imaging of the abdomen was performed. Heavily T2-weighted images of the biliary and  pancreatic ducts were obtained, and three-dimensional MRCP images were rendered by post processing. COMPARISON:  CT of 1 day prior.  Ultrasound of 05/23/2015 FINDINGS: Lower chest: Normal heart size without pericardial or pleural effusion. Tiny hiatal hernia. Hepatobiliary: 2.4 cm gallstone. Suggestion of gallbladder wall thickening at the fundus, including on image 31/ series 10. Contiguous 2.5 cm pericholecystic hepatic lesion straddling the right and left lobes. This is moderately T2 hyperintense, irregular, with restricted diffusion. Example image 21/series 3. Intrahepatic ducts either borderline prominent, including on image 21/ series 3. There is a suggestion of T2 hyperintensity along the portal triads, including on image 19/ series 3. This is nonspecific and could represent edema. The common duct becomes narrowed in the region of the porta hepatis. An ill-defined soft tissue signal measures 4.6 x 2.5 cm on image 23/ series 3. The more distal common duct is normal in caliber. This area of narrowed common duct is readily apparent on image 9/ series 400, image 65/series 4. Pancreas:  Normal, without mass or ductal dilatation. Spleen:  Normal in size, without focal abnormality. Adrenals/Urinary Tract: Normal adrenal glands. Normal kidneys, without hydronephrosis. Stomach/Bowel: Normal remainder of the stomach. Normal caliber of large and small bowel loops. Vascular/Lymphatic: Normal caliber of the aorta and branch vessels. No other abdominal adenopathy. Other: No ascites. Incompletely imaged omental thickening within the pelvis on image 30/series 10. Musculoskeletal: No acute osseous abnormality. IMPRESSION: 1. Borderline intrahepatic duct dilatation with narrowing of the common duct in the porta hepatis at the site of a soft tissue signal lesion which is likely infiltrative adenopathy. The more distal common duct is normal in caliber. 2. Gallstone with nonspecific gallbladder fundal wall thickening. Adjacent  suspicious liver lesion. Considerations include metastatic disease from the patient's presumed primary versus direct extension of gallbladder carcinoma into the adjacent liver. Consider sampling of the liver lesion for staging. 3. Suggestion of extension of edema along the portal triads, nonspecific. Contrast-enhanced hepatic protocol CT may be informative. 4. Incompletely imaged omental thickening/nodularity as on CT. This is highly suspicious for peritoneal metastasis. Potential primaries include ovarian, gallbladder, or an otherwise unknown primary. Electronically Signed   By: Abigail Miyamoto M.D.   On: 05/23/2016 21:58   Mr 3d Recon At Scanner  Result Date: 05/23/2016 CLINICAL DATA:  Elevated liver function tests. CT demonstrating gallstones and possible soft tissue expansion in the porta hepatis. EXAM: MRI ABDOMEN WITHOUT CONTRAST  (INCLUDING MRCP) TECHNIQUE: Multiplanar multisequence MR imaging of the abdomen was performed. Heavily T2-weighted images of  the biliary and pancreatic ducts were obtained, and three-dimensional MRCP images were rendered by post processing. COMPARISON:  CT of 1 day prior.  Ultrasound of 05/23/2015 FINDINGS: Lower chest: Normal heart size without pericardial or pleural effusion. Tiny hiatal hernia. Hepatobiliary: 2.4 cm gallstone. Suggestion of gallbladder wall thickening at the fundus, including on image 31/ series 10. Contiguous 2.5 cm pericholecystic hepatic lesion straddling the right and left lobes. This is moderately T2 hyperintense, irregular, with restricted diffusion. Example image 21/series 3. Intrahepatic ducts either borderline prominent, including on image 21/ series 3. There is a suggestion of T2 hyperintensity along the portal triads, including on image 19/ series 3. This is nonspecific and could represent edema. The common duct becomes narrowed in the region of the porta hepatis. An ill-defined soft tissue signal measures 4.6 x 2.5 cm on image 23/ series 3. The more  distal common duct is normal in caliber. This area of narrowed common duct is readily apparent on image 9/ series 400, image 65/series 4. Pancreas:  Normal, without mass or ductal dilatation. Spleen:  Normal in size, without focal abnormality. Adrenals/Urinary Tract: Normal adrenal glands. Normal kidneys, without hydronephrosis. Stomach/Bowel: Normal remainder of the stomach. Normal caliber of large and small bowel loops. Vascular/Lymphatic: Normal caliber of the aorta and branch vessels. No other abdominal adenopathy. Other: No ascites. Incompletely imaged omental thickening within the pelvis on image 30/series 10. Musculoskeletal: No acute osseous abnormality. IMPRESSION: 1. Borderline intrahepatic duct dilatation with narrowing of the common duct in the porta hepatis at the site of a soft tissue signal lesion which is likely infiltrative adenopathy. The more distal common duct is normal in caliber. 2. Gallstone with nonspecific gallbladder fundal wall thickening. Adjacent suspicious liver lesion. Considerations include metastatic disease from the patient's presumed primary versus direct extension of gallbladder carcinoma into the adjacent liver. Consider sampling of the liver lesion for staging. 3. Suggestion of extension of edema along the portal triads, nonspecific. Contrast-enhanced hepatic protocol CT may be informative. 4. Incompletely imaged omental thickening/nodularity as on CT. This is highly suspicious for peritoneal metastasis. Potential primaries include ovarian, gallbladder, or an otherwise unknown primary. Electronically Signed   By: Abigail Miyamoto M.D.   On: 05/23/2016 21:58     Scheduled Meds: . amLODipine  10 mg Oral Daily  . irbesartan  300 mg Oral Daily   And  . hydrochlorothiazide  12.5 mg Oral Daily  . insulin aspart  0-5 Units Subcutaneous QHS  . insulin aspart  0-9 Units Subcutaneous TID WC  . insulin detemir  15 Units Subcutaneous QHS  . levofloxacin (LEVAQUIN) IV  750 mg  Intravenous Q48H  . metronidazole  500 mg Intravenous Q8H   Continuous Infusions: . sodium chloride 100 mL/hr at 05/25/16 1329     LOS: 3 days   Time Spent in minutes   30 minutes  Symir Mah D.O. on 05/25/2016 at 1:37 PM  Between 7am to 7pm - Pager - [phone]  After 7pm go to [email or web address] - password [email or web address]  And look for the night coverage person covering for me after hours  [facility] Office  [phone]

## 2016-05-26 ENCOUNTER — Inpatient Hospital Stay (HOSPITAL_COMMUNITY): Payer: Medicare Other

## 2016-05-26 DIAGNOSIS — R1900 Intra-abdominal and pelvic swelling, mass and lump, unspecified site: Secondary | ICD-10-CM

## 2016-05-26 LAB — CBC
HEMATOCRIT: 31.2 % — AB (ref 36.0–46.0)
HEMOGLOBIN: 10.6 g/dL — AB (ref 12.0–15.0)
MCH: 25.7 pg — AB (ref 26.0–34.0)
MCHC: 34 g/dL (ref 30.0–36.0)
MCV: 75.5 fL — AB (ref 78.0–100.0)
Platelets: 363 10*3/uL (ref 150–400)
RBC: 4.13 MIL/uL (ref 3.87–5.11)
RDW: 17.1 % — ABNORMAL HIGH (ref 11.5–15.5)
WBC: 14.1 10*3/uL — ABNORMAL HIGH (ref 4.0–10.5)

## 2016-05-26 LAB — BASIC METABOLIC PANEL
Anion gap: 10 (ref 5–15)
BUN: 15 mg/dL (ref 6–20)
CHLORIDE: 106 mmol/L (ref 101–111)
CO2: 22 mmol/L (ref 22–32)
CREATININE: 1.38 mg/dL — AB (ref 0.44–1.00)
Calcium: 8.8 mg/dL — ABNORMAL LOW (ref 8.9–10.3)
GFR calc non Af Amer: 36 mL/min — ABNORMAL LOW (ref >60)
GFR, EST AFRICAN AMERICAN: 42 mL/min — AB (ref >60)
GLUCOSE: 138 mg/dL — AB (ref 65–99)
Potassium: 3.2 mmol/L — ABNORMAL LOW (ref 3.5–5.1)
Sodium: 138 mmol/L (ref 135–145)

## 2016-05-26 LAB — GLUCOSE, CAPILLARY
GLUCOSE-CAPILLARY: 96 mg/dL (ref 65–99)
GLUCOSE-CAPILLARY: 310 mg/dL — AB (ref 65–99)
Glucose-Capillary: 132 mg/dL — ABNORMAL HIGH (ref 65–99)
Glucose-Capillary: 103 mg/dL — ABNORMAL HIGH (ref 65–99)

## 2016-05-26 LAB — CANCER ANTIGEN 19-9: CA 19 9: 5111 U/mL — AB (ref 0–35)

## 2016-05-26 LAB — CEA: CEA: 14.5 ng/mL — AB (ref 0.0–4.7)

## 2016-05-26 MED ORDER — MIDAZOLAM HCL 2 MG/2ML IJ SOLN
INTRAMUSCULAR | Status: AC | PRN
Start: 2016-05-26 — End: 2016-05-26
  Administered 2016-05-26 (×2): 1 mg via INTRAVENOUS

## 2016-05-26 MED ORDER — POTASSIUM CHLORIDE CRYS ER 20 MEQ PO TBCR
40.0000 meq | EXTENDED_RELEASE_TABLET | Freq: Once | ORAL | Status: AC
  Administered 2016-05-26: 40 meq via ORAL
  Filled 2016-05-26: qty 2

## 2016-05-26 MED ORDER — FENTANYL CITRATE (PF) 100 MCG/2ML IJ SOLN
INTRAMUSCULAR | Status: AC
  Filled 2016-05-26: qty 2

## 2016-05-26 MED ORDER — MIDAZOLAM HCL 2 MG/2ML IJ SOLN
INTRAMUSCULAR | Status: AC
Start: 2016-05-26 — End: 2016-05-27
  Filled 2016-05-26: qty 4

## 2016-05-26 MED ORDER — FENTANYL CITRATE (PF) 100 MCG/2ML IJ SOLN
INTRAMUSCULAR | Status: AC | PRN
  Administered 2016-05-26 (×2): 50 ug via INTRAVENOUS

## 2016-05-26 NOTE — Progress Notes (Signed)
[facility] Progress Note  Subjective: No complaints. Waiting on omental biopsy.  Objective: Vital signs in last 24 hours: Temp:  [99.5 F (37.5 C)-99.8 F (37.7 C)] 99.5 F (37.5 C) (01/15 0655) Pulse Rate:  [82-97] 82 (01/15 0655) Resp:  [18] 18 (01/15 0655) BP: (120-156)/(66-79) 156/66 (01/15 0655) SpO2:  [100 %] 100 % (01/15 0655) Weight change:    PE: No distress  Lab Results: Results for orders placed or performed during the hospital encounter of 05/22/16 (from the past 24 hour(s))  Glucose, capillary     Status: Abnormal   Collection Time: 05/25/16 12:44 PM  Result Value Ref Range   Glucose-Capillary 254 (H) 65 - 99 mg/dL  Glucose, capillary     Status: Abnormal   Collection Time: 05/25/16  5:58 PM  Result Value Ref Range   Glucose-Capillary 229 (H) 65 - 99 mg/dL  Glucose, capillary     Status: Abnormal   Collection Time: 05/25/16 10:29 PM  Result Value Ref Range   Glucose-Capillary 207 (H) 65 - 99 mg/dL  Glucose, capillary     Status: Abnormal   Collection Time: 05/26/16  7:33 AM  Result Value Ref Range   Glucose-Capillary 132 (H) 65 - 99 mg/dL  CBC     Status: Abnormal   Collection Time: 05/26/16  8:43 AM  Result Value Ref Range   WBC 14.1 (H) 4.0 - 10.5 K/uL   RBC 4.13 3.87 - 5.11 MIL/uL   Hemoglobin 10.6 (L) 12.0 - 15.0 g/dL   HCT 31.2 (L) 36.0 - 46.0 %   MCV 75.5 (L) 78.0 - 100.0 fL   MCH 25.7 (L) 26.0 - 34.0 pg   MCHC 34.0 30.0 - 36.0 g/dL   RDW 17.1 (H) 11.5 - 15.5 %   Platelets 363 150 - 400 K/uL  Basic metabolic panel     Status: Abnormal   Collection Time: 05/26/16  8:43 AM  Result Value Ref Range   Sodium 138 135 - 145 mmol/L   Potassium 3.2 (L) 3.5 - 5.1 mmol/L   Chloride 106 101 - 111 mmol/L   CO2 22 22 - 32 mmol/L   Glucose, Bld 138 (H) 65 - 99 mg/dL   BUN 15 6 - 20 mg/dL   Creatinine, Ser 1.38 (H) 0.44 - 1.00 mg/dL   Calcium 8.8 (L) 8.9 - 10.3 mg/dL   GFR calc non Af Amer 36 (L) >60 mL/min   GFR calc Af Amer 42 (L) >60  mL/min   Anion gap 10 5 - 15  Glucose, capillary     Status: None   Collection Time: 05/26/16 11:51 AM  Result Value Ref Range   Glucose-Capillary 96 65 - 99 mg/dL    Studies/Results: No results found.    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  Plan:   Waiting on biopsy.     SAM F Rannie Craney 05/26/2016, 11:53 AM  Pager: [phone] If no answer or after 5 PM call [phone]

## 2016-05-26 NOTE — Procedures (Signed)
CT core biopsy omentum 4 x 18g to surg path No complication No blood loss. See complete dictation in [facility].

## 2016-05-26 NOTE — Progress Notes (Signed)
PROGRESS NOTE    [name]  [id number] DOB: 06-12-1940 DOA: 05/22/2016 PCP: Lilian Coma, MD   Chief Complaint  Patient presents with  . Vaginal Bleeding    Brief Narrative:  HPI On 05/22/2016 by Dr. Lily Kocher  [name] is a 76 y.o. woman with a history of HTN, DM, and CKD 3 who presents to the ED with multiple complaints, including abnormal vaginal bleeding, dysuria (with possible hematuria), and rectal pain with bowel movements (though she denies constipation or the need to strain).  She has not seen any blood in her stool.  She denies associated fever.  No abdominal pain.  No nausea or vomiting, but she reports decreased appetite and 5 lb weight loss since Christmas.  No headache.  No chest pain or shortness of breath.  She has had increased weakness. Assessment & Plan   Abnormal LFTs /hyperbilirubinemia -with abnormal gallbladder appearance on CT.  She has had weight loss and loss of appetite; otherwise, asymptomatic. -Gastroenterology and general surgery consulted and appreciated -Started empirically on Levaquin, Flagyl -Given abnormal findings on CT scan with weight loss as well as vaginal bleeding, and for cancer -pending hepatitis panel -MRCP and abdominal MRI: Borderline intrahepatic duct dilatation, narrowing of common bile duct in the porta hepatis at the site of soft tissue signal lesion likely infiltrative adenopathy. Gallstones with nonspecific gallbladder fundal wall thickening suspicious for liver lesion -Spoke with general surgery, no role for surgery at this time. -Spoke with GI, recommended biopsy, possible ERCP with stenting next week. -Interventional radiology consulted for biopsy- omental- supposed to occur today -CEA and CA19-9 pending  Essential hypertension -Continue amlodipine, chlorothiazide, Avapro  Diabetes mellitus -Continue complaints scale, Levemir, CBG monitoring  Chronic kidney disease, stage III -Patient noted to have  GFR in stage III 2015, mild dip in 2017, currently stage III -Creatinine currently 1.38, continue monitor CMP  Hypokalemia -Replaced, continue to monitor CMP  Possible UTI -Urine culture showed multiple species -Continue Levaquin  Abnormal vaginal bleeding -Possibly related to the above -Outpatient follow up -Monitor CBC -Hemoglobin currently 10.6  Leukocytosis  -Possibly reactive vs ?UTI -patient is currently on levaquin and flagyl -Continue to monitor   DVT Prophylaxis  SCDs  Code Status: Full  Family Communication: None at bedside.   Disposition Plan: Admitted. Pending further workup- biopsy today   Consultants Gastroenterology, [facility] Interventional radiology  Procedures  Abdominal US MRCP  Antibiotics   Anti-infectives    Start     Dose/Rate Route Frequency Ordered Stop   05/23/16 0400  levofloxacin (LEVAQUIN) IVPB 750 mg     750 mg 100 mL/hr over 90 Minutes Intravenous Every 48 hours 05/23/16 0119     05/23/16 0026  metroNIDAZOLE (FLAGYL) IVPB 500 mg     500 mg 100 mL/hr over 60 Minutes Intravenous Every 8 hours 05/23/16 0026     05/22/16 2045  metroNIDAZOLE (FLAGYL) IVPB 500 mg     500 mg 100 mL/hr over 60 Minutes Intravenous  Once 05/22/16 2043 05/23/16 0042   05/22/16 1930  ciprofloxacin (CIPRO) tablet 500 mg     500 mg Oral  Once 05/22/16 1924 05/22/16 2019      Subjective:   [name] seen and examined today. Patient denies current abdominal pain, nausea, vomiting, chest pain, shortness of breath, chest pain, dizziness, headache.   Objective:   Vitals:   05/24/16 2317 05/25/16 1140 05/25/16 2232 05/26/16 0655  BP: (!) 156/77 (!) 144/92 120/79 (!) 156/66  Pulse: 95  90 97 82  Resp: 18 18 18 18   Temp: 98.5 F (36.9 C) 98.3 F (36.8 C) 99.8 F (37.7 C) 99.5 F (37.5 C)  TempSrc: Oral Oral Oral Oral  SpO2: 100% 99% 100% 100%  Weight:      Height:        Intake/Output Summary (Last 24 hours) at 05/26/16  1256 Last data filed at 05/26/16 1033  Gross per 24 hour  Intake             2940 ml  Output              400 ml  Net             2540 ml   Filed Weights   05/22/16 1048 05/22/16 2145  Weight: 80.3 kg (177 lb) 78.5 kg (173 lb)    Exam  General: Well developed, well nourished, NAD, appears stated age  21: NCAT, scleral icterus, mucous membranes moist.   Cardiovascular: S1 S2 auscultated, RRR, no murmurs  Respiratory: Clear to auscultation bilaterally with equal chest rise  Abdomen: Soft, nontender, nondistended, + bowel sounds  Extremities: warm dry without cyanosis clubbing or edema  Neuro: AAOx3, nonfocal  Psych: Appropriate mood and affect.   Data Reviewed: I have personally reviewed following labs and imaging studies  CBC:  Recent Labs Lab 05/22/16 1736 05/23/16 0439 05/24/16 0453 05/25/16 0437 05/26/16 0843  WBC 11.1* 9.9 11.3* 12.4* 14.1*  HGB 11.7* 10.1* 10.4* 10.4* 10.6*  HCT 34.0* 29.6* 30.9* 30.6* 31.2*  MCV 77.6* 75.9* 76.3* 75.9* 75.5*  PLT 388 395 357 366 [id number]   Basic Metabolic Panel:  Recent Labs Lab 05/22/16 1736 05/23/16 0439 05/24/16 0453 05/25/16 0437 05/26/16 0843  NA 134* 136 140 137 138  K 3.9 4.0 3.3* 3.6 3.2*  CL 101 106 109 108 106  CO2 20* 20* 20* 19* 22  GLUCOSE 340* 399* 193* 183* 138*  BUN 29* 30* 21* 16 15  CREATININE 1.63* 1.40* 1.40* 1.30* 1.38*  CALCIUM 9.4 8.8* 8.6* 8.5* 8.8*   GFR: Estimated Creatinine Clearance: 38 mL/min (by C-G formula based on SCr of 1.38 mg/dL (H)). Liver Function Tests:  Recent Labs Lab 05/22/16 1736 05/24/16 0453 05/25/16 0437  AST 177* 147* 144*  ALT 213* 161* 141*  ALKPHOS 623* 515* 490*  BILITOT 7.6* 6.7* 6.8*  PROT 9.1* 7.7 7.5  ALBUMIN 3.5 2.9* 2.8*   No results for input(s): LIPASE, AMYLASE in the last 168 hours. No results for input(s): AMMONIA in the last 168 hours. Coagulation Profile:  Recent Labs Lab 05/23/16 0439 05/25/16 0437  INR 1.02 1.11   Cardiac  Enzymes: No results for input(s): CKTOTAL, CKMB, CKMBINDEX, TROPONINI in the last 168 hours. BNP (last 3 results) No results for input(s): PROBNP in the last 8760 hours. HbA1C: No results for input(s): HGBA1C in the last 72 hours. CBG:  Recent Labs Lab 05/25/16 1244 05/25/16 1758 05/25/16 2229 05/26/16 0733 05/26/16 1151  GLUCAP 254* 229* 207* 132* 96   Lipid Profile: No results for input(s): CHOL, HDL, LDLCALC, TRIG, CHOLHDL, LDLDIRECT in the last 72 hours. Thyroid Function Tests: No results for input(s): TSH, T4TOTAL, FREET4, T3FREE, THYROIDAB in the last 72 hours. Anemia Panel: No results for input(s): VITAMINB12, FOLATE, FERRITIN, TIBC, IRON, RETICCTPCT in the last 72 hours. Urine analysis:    Component Value Date/Time   COLORURINE AMBER (A) 05/22/2016 1700   APPEARANCEUR CLOUDY (A) 05/22/2016 1700   LABSPEC 1.012 05/22/2016 1700   PHURINE 5.0 05/22/2016 1700  GLUCOSEU 150 (A) 05/22/2016 1700   HGBUR LARGE (A) 05/22/2016 1700   BILIRUBINUR SMALL (A) 05/22/2016 1700   KETONESUR NEGATIVE 05/22/2016 1700   PROTEINUR 100 (A) 05/22/2016 1700   UROBILINOGEN 0.2 12/03/2013 2155   NITRITE NEGATIVE 05/22/2016 1700   LEUKOCYTESUR LARGE (A) 05/22/2016 1700   Sepsis Labs: @LABRCNTIP (procalcitonin:4,lacticidven:4)  ) Recent Results (from the past 240 hour(s))  Urine culture     Status: Abnormal   Collection Time: 05/22/16  5:00 PM  Result Value Ref Range Status   Specimen Description URINE, RANDOM  Final   Special Requests NONE  Final   Culture MULTIPLE SPECIES PRESENT, SUGGEST RECOLLECTION (A)  Final   Report Status 05/24/2016 FINAL  Final      Radiology Studies: No results found.   Scheduled Meds: . amLODipine  10 mg Oral Daily  . irbesartan  300 mg Oral Daily   And  . hydrochlorothiazide  12.5 mg Oral Daily  . insulin aspart  0-5 Units Subcutaneous QHS  . insulin aspart  0-9 Units Subcutaneous TID WC  . insulin detemir  15 Units Subcutaneous QHS  .  levofloxacin (LEVAQUIN) IV  750 mg Intravenous Q48H  . metronidazole  500 mg Intravenous Q8H   Continuous Infusions: . sodium chloride 100 mL/hr at 05/25/16 1400     LOS: 4 days   Time Spent in minutes   30 minutes  Jerritt Cardoza D.O. on 05/26/2016 at 12:56 PM  Between 7am to 7pm - Pager - [phone]  After 7pm go to [email or web address] - password [email or web address]  And look for the night coverage person covering for me after hours  [facility] Office  [phone]

## 2016-05-26 NOTE — Care Management Important Message (Signed)
Important Message  Patient Details  Name: CATELYNN VANDERSLUIS MRN: [id number] Date of Birth: 03/07/1941   Medicare Important Message Given:  Yes    Kerin Salen 05/26/2016, 1:47 PMImportant Message  Patient Details  Name: PAYLIN HEAVIN MRN: [id number] Date of Birth: 1941-03-21   Medicare Important Message Given:  Yes    Kerin Salen 05/26/2016, 1:47 PM

## 2016-05-27 ENCOUNTER — Inpatient Hospital Stay (HOSPITAL_COMMUNITY): Payer: Medicare Other

## 2016-05-27 LAB — COMPREHENSIVE METABOLIC PANEL
ALBUMIN: 2.7 g/dL — AB (ref 3.5–5.0)
ALT: 95 U/L — ABNORMAL HIGH (ref 14–54)
AST: 106 U/L — AB (ref 15–41)
Alkaline Phosphatase: 425 U/L — ABNORMAL HIGH (ref 38–126)
Anion gap: 10 (ref 5–15)
BUN: 16 mg/dL (ref 6–20)
CHLORIDE: 105 mmol/L (ref 101–111)
CO2: 21 mmol/L — AB (ref 22–32)
CREATININE: 1.25 mg/dL — AB (ref 0.44–1.00)
Calcium: 8.6 mg/dL — ABNORMAL LOW (ref 8.9–10.3)
GFR calc Af Amer: 48 mL/min — ABNORMAL LOW (ref >60)
GFR, EST NON AFRICAN AMERICAN: 41 mL/min — AB (ref >60)
Glucose, Bld: 223 mg/dL — ABNORMAL HIGH (ref 65–99)
POTASSIUM: 3.4 mmol/L — AB (ref 3.5–5.1)
SODIUM: 136 mmol/L (ref 135–145)
Total Bilirubin: 7.7 mg/dL — ABNORMAL HIGH (ref 0.3–1.2)
Total Protein: 7.8 g/dL (ref 6.5–8.1)

## 2016-05-27 LAB — CBC
HCT: 29.8 % — ABNORMAL LOW (ref 36.0–46.0)
Hemoglobin: 10.3 g/dL — ABNORMAL LOW (ref 12.0–15.0)
MCH: 26.2 pg (ref 26.0–34.0)
MCHC: 34.6 g/dL (ref 30.0–36.0)
MCV: 75.8 fL — ABNORMAL LOW (ref 78.0–100.0)
PLATELETS: 348 10*3/uL (ref 150–400)
RBC: 3.93 MIL/uL (ref 3.87–5.11)
RDW: 17.3 % — ABNORMAL HIGH (ref 11.5–15.5)
WBC: 11.6 10*3/uL — AB (ref 4.0–10.5)

## 2016-05-27 LAB — GLUCOSE, CAPILLARY
GLUCOSE-CAPILLARY: 197 mg/dL — AB (ref 65–99)
GLUCOSE-CAPILLARY: 126 mg/dL — AB (ref 65–99)
GLUCOSE-CAPILLARY: 99 mg/dL (ref 65–99)
GLUCOSE-CAPILLARY: 150 mg/dL — AB (ref 65–99)

## 2016-05-27 MED ORDER — POTASSIUM CHLORIDE CRYS ER 20 MEQ PO TBCR
40.0000 meq | EXTENDED_RELEASE_TABLET | Freq: Once | ORAL | Status: AC
Start: 2016-05-27 — End: 2016-05-27
  Administered 2016-05-27: 40 meq via ORAL
  Filled 2016-05-27: qty 2

## 2016-05-27 NOTE — Progress Notes (Signed)
Pharmacy Antibiotic Note  [name] is a 76 y.o. female c/o buring and blood with urination admitted on 05/22/2016 with intra-abdominal infection.  Pharmacy has been consulted for Levaquin dosing, MD is dosing Flagyl. Now s/p CT core biopsy omentum 1/15 and plans for possible ERCP with stenting next week pending results.  Today, 05/27/2016 - Day #6 Levaquin 750mg  IV q48h, Flagyl 500mg  IV q8h - Tm 99.6 - WBC down 11.6 - SCr improved 1.25, CrCl 42 ml/min  Plan: - Levaquin 750 mg IV q48h - Flagyl 500 mg IV q8h (MD) - Follow up duration of abx therapy with MD  Height: 5\' 7"  (170.2 cm) Weight: 173 lb (78.5 kg) IBW/kg (Calculated) : 61.6  Temp (24hrs), Avg:98.7 F (37.1 C), Min:98.2 F (36.8 C), Max:99.6 F (37.6 C)   Recent Labs Lab 05/23/16 0439 05/24/16 0453 05/25/16 0437 05/26/16 0843 05/27/16 0509  WBC 9.9 11.3* 12.4* 14.1* 11.6*  CREATININE 1.40* 1.40* 1.30* 1.38* 1.25*    Estimated Creatinine Clearance: 42 mL/min (by C-G formula based on SCr of 1.25 mg/dL (H)).    Allergies  Allergen Reactions  . Rosiglitazone Maleate Other (See Comments)    couldn't sleep  . Metformin Diarrhea  . Penicillins Hives and Rash    Has patient had a PCN reaction causing immediate rash, facial/tongue/throat swelling, SOB or lightheadedness with hypotension: yes Has patient had a PCN reaction causing severe rash involving mucus membranes or skin necrosis: no Has patient had a PCN reaction that required hospitalization:no Has patient had a PCN reaction occurring within the last 10 years: no If all of the above answers are "NO", then may proceed with Cephalosporin use.  . Pioglitazone Other (See Comments)    Joint pain     Antimicrobials this admission:  1/11 Cipro po >> x1 ED 1/11 Flagyl >>  1/12 Levaquin >>  Dose adjustments this admission:  ---  Microbiology results:  1/11 UCx: multiple, suggest recollect 1/11 HIV Ab: non reactive 1/11 Hepatitis panel: negative  Thank  you for allowing pharmacy to be a part of this patient's care.  Peggyann Juba, PharmD, BCPS Pager: [phone] 05/27/2016 10:21 AM

## 2016-05-27 NOTE — Progress Notes (Signed)
No specific complaints today. Biopsy results pending. She is set up for ERCP tomorrow with hopes of stent placement for relief of obstructive jaundice. This will be done by Dr. Paulita Fujita. I explained the procedure to her along with the potential risks of bleeding, infection, perforation, and pancreatitis, as well as other unforeseen serious events. She understands and consents to the procedure.

## 2016-05-27 NOTE — Progress Notes (Addendum)
PROGRESS NOTE    ROSICELA BAYON  [id number] DOB: Mar 29, 1941 DOA: 05/22/2016 PCP: Lilian Coma, MD   Chief Complaint  Patient presents with  . Vaginal Bleeding    Brief Narrative:  HPI On 05/22/2016 by Dr. 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  She has not seen any blood in her stool.  She denies associated fever.  No abdominal pain.  No nausea or vomiting, but she reports decreased appetite and 5 lb weight loss since Christmas.  No headache.  No chest pain or shortness of breath.  She has had increased weakness.  Interim history GI and gen surgery consulted.  S/p omental biopsy- results pending. Plan for ERCP on 1/17.   Assessment & Plan   Abnormal LFTs /hyperbilirubinemia/?metastatic disease -with abnormal gallbladder appearance on CT.  She has had weight loss and loss of appetite; otherwise, asymptomatic. -Gastroenterology and general surgery consulted and appreciated -Started empirically on Levaquin, Flagyl -Given abnormal findings on CT scan with weight loss as well as vaginal bleeding, and for cancer -pending hepatitis panel -MRCP and abdominal MRI: Borderline intrahepatic duct dilatation, narrowing of common bile duct in the porta hepatis at the site of soft tissue signal lesion likely infiltrative adenopathy. Gallstones with nonspecific gallbladder fundal wall thickening suspicious for liver lesion -Spoke with general surgery, no role for surgery at this time. -Spoke with GI, recommended biopsy, possible ERCP on 05/28/2016 -Interventional radiology consulted s/p biopsy 05/26/2016- results pending -CEA 14.5 and CA19-9 5111 -Spoke with Dr. Alvy Bimler- since biopsy results are pending, she will continue to follow from  [place]- message has been sent to GI oncology coordinator  Essential hypertension -Continue amlodipine, chlorothiazide, Avapro  Diabetes mellitus -Continue complaints scale, Levemir, CBG monitoring  Chronic kidney disease, stage III -Patient noted to have GFR in stage III 2015, mild dip in 2017, currently stage III -Creatinine currently 1.25, continue monitor CMP  Hypokalemia -Replacing, continue to monitor CMP  Possible UTI -Urine culture showed multiple species -Continue Levaquin  Abnormal vaginal bleeding -Possibly related to the above -Outpatient follow up -Monitor CBC -Hemoglobin currently 10.3  Leukocytosis  -Possibly reactive vs ?UTI -WBC trending downward 11.6 -patient is currently on levaquin and flagyl -Continue to monitor   DVT Prophylaxis  SCDs  Code Status: Full  Family Communication: None at bedside.   Disposition Plan: Admitted. Pending ERCP on 05/28/2016 and biopsy results.  Consultants Gastroenterology, [facility] Interventional radiology Oncology   Procedures  Abdominal US MRCP CT core biopsy omentum   Antibiotics   Anti-infectives    Start     Dose/Rate Route Frequency Ordered Stop   05/23/16 0400  levofloxacin (LEVAQUIN) IVPB 750 mg     750 mg 100 mL/hr over 90 Minutes Intravenous Every 48 hours 05/23/16 0119     05/23/16 0026  metroNIDAZOLE (FLAGYL) IVPB 500 mg     500 mg 100 mL/hr over 60 Minutes Intravenous Every 8 hours 05/23/16 0026     05/22/16 2045  metroNIDAZOLE (FLAGYL) IVPB 500 mg     500 mg 100 mL/hr over 60 Minutes Intravenous  Once 05/22/16 2043 05/23/16 0042   05/22/16 1930  ciprofloxacin (CIPRO) tablet 500 mg     500 mg Oral  Once 05/22/16 1924 05/22/16 2019  Subjective:   [place] seen and examined today. Patient feels hungry today. Denies current abdominal pain, nausea, vomiting, chest pain, shortness of breath, chest pain, dizziness, headache.   Objective:   Vitals:   05/26/16 1640  05/26/16 2152 05/27/16 0635 05/27/16 0947  BP: (!) 146/67 (!) 150/63 (!) 144/63 (!) 157/70  Pulse: 92 (!) 101 81   Resp: 12 14 16    Temp:  99.6 F (37.6 C) 98.2 F (36.8 C)   TempSrc:  Oral Oral   SpO2: 96% 98% 100%   Weight:      Height:        Intake/Output Summary (Last 24 hours) at 05/27/16 1316 Last data filed at 05/27/16 1100  Gross per 24 hour  Intake             3130 ml  Output              350 ml  Net             2780 ml   Filed Weights   05/22/16 1048 05/22/16 2145  Weight: 80.3 kg (177 lb) 78.5 kg (173 lb)    Exam  General: Well developed, well nourished, NAD, appears stated age  14: NCAT, scleral icterus, mucous membranes moist.   Cardiovascular: S1 S2 auscultated, RRR, no murmurs  Respiratory: Clear to auscultation bilaterally with equal chest rise  Abdomen: Soft, nontender, nondistended, + bowel sounds  Extremities: warm dry without cyanosis clubbing or edema  Neuro: AAOx3, nonfocal  Psych: Appropriate mood and affect, pleasant   Data Reviewed: I have personally reviewed following labs and imaging studies  CBC:  Recent Labs Lab 05/23/16 0439 05/24/16 0453 05/25/16 0437 05/26/16 0843 05/27/16 0509  WBC 9.9 11.3* 12.4* 14.1* 11.6*  HGB 10.1* 10.4* 10.4* 10.6* 10.3*  HCT 29.6* 30.9* 30.6* 31.2* 29.8*  MCV 75.9* 76.3* 75.9* 75.5* 75.8*  PLT 395 357 366 363 0000000   Basic Metabolic Panel:  Recent Labs Lab 05/23/16 0439 05/24/16 0453 05/25/16 0437 05/26/16 0843 05/27/16 0509  NA 136 140 137 138 136  K 4.0 3.3* 3.6 3.2* 3.4*  CL 106 109 108 106 105  CO2 20* 20* 19* 22 21*  GLUCOSE 399* 193* 183* 138* 223*  BUN 30* 21* 16 15 16   CREATININE 1.40* 1.40* 1.30* 1.38* 1.25*  CALCIUM 8.8* 8.6* 8.5* 8.8* 8.6*   GFR: Estimated Creatinine Clearance: 42 mL/min (by C-G formula based on SCr of 1.25 mg/dL (H)). Liver Function Tests:  Recent Labs Lab 05/22/16 1736 05/24/16 0453 05/25/16 0437 05/27/16 0509  AST 177* 147* 144* 106*  ALT  213* 161* 141* 95*  ALKPHOS 623* 515* 490* 425*  BILITOT 7.6* 6.7* 6.8* 7.7*  PROT 9.1* 7.7 7.5 7.8  ALBUMIN 3.5 2.9* 2.8* 2.7*   No results for input(s): LIPASE, AMYLASE in the last 168 hours. No results for input(s): AMMONIA in the last 168 hours. Coagulation Profile:  Recent Labs Lab 05/23/16 0439 05/25/16 0437  INR 1.02 1.11   Cardiac Enzymes: No results for input(s): CKTOTAL, CKMB, CKMBINDEX, TROPONINI in the last 168 hours. BNP (last 3 results) No results for input(s): PROBNP in the last 8760 hours. HbA1C: No results for input(s): HGBA1C in the last 72 hours. CBG:  Recent Labs Lab 05/26/16 1151 05/26/16 1711 05/26/16 2159 05/27/16 0733 05/27/16 1149  GLUCAP 96 103* 310* 197* 126*   Lipid Profile: No results for input(s): CHOL, HDL, LDLCALC, TRIG, CHOLHDL, LDLDIRECT in the last 72 hours. Thyroid Function Tests: No results for input(s):  TSH, T4TOTAL, FREET4, T3FREE, THYROIDAB in the last 72 hours. Anemia Panel: No results for input(s): VITAMINB12, FOLATE, FERRITIN, TIBC, IRON, RETICCTPCT in the last 72 hours. Urine analysis:    Component Value Date/Time   COLORURINE AMBER (A) 05/22/2016 1700   APPEARANCEUR CLOUDY (A) 05/22/2016 1700   LABSPEC 1.012 05/22/2016 1700   PHURINE 5.0 05/22/2016 1700   GLUCOSEU 150 (A) 05/22/2016 1700   HGBUR LARGE (A) 05/22/2016 1700   BILIRUBINUR SMALL (A) 05/22/2016 1700   KETONESUR NEGATIVE 05/22/2016 1700   PROTEINUR 100 (A) 05/22/2016 1700   UROBILINOGEN 0.2 12/03/2013 2155   NITRITE NEGATIVE 05/22/2016 1700   LEUKOCYTESUR LARGE (A) 05/22/2016 1700   Sepsis Labs: @LABRCNTIP (procalcitonin:4,lacticidven:4)  ) Recent Results (from the past 240 hour(s))  Urine culture     Status: Abnormal   Collection Time: 05/22/16  5:00 PM  Result Value Ref Range Status   Specimen Description URINE, RANDOM  Final   Special Requests NONE  Final   Culture MULTIPLE SPECIES PRESENT, SUGGEST RECOLLECTION (A)  Final   Report Status  05/24/2016 FINAL  Final      Radiology Studies: Ct Biopsy  Result Date: June 18, 2016 CLINICAL DATA:  omental thickening/nodularity as on CT. Thisis highly suspicious for peritoneal metastasis. Potential primariesinclude ovarian, gallbladder, or an otherwise unknown primary EXAM: CT GUIDED CORE BIOPSY OF OMENTUM ANESTHESIA/SEDATION: Intravenous Fentanyl and Versed were administered as conscious sedation during continuous monitoring of the patient's level of consciousness and physiological / cardiorespiratory status by the radiology RN, with a total moderate sedation time of 10 minutes. PROCEDURE: The procedure risks, benefits, and alternatives were explained to the patient. Questions regarding the procedure were encouraged and answered. The patient understands and consents to the procedure. Select axial scans through the lower abdomen were obtained. The omental disease was localized and an appropriate skin entry site determined and marked. The operative field was prepped with chlorhexidinein a sterile fashion, and a sterile drape was applied covering the operative field. A sterile gown and sterile gloves were used for the procedure. Local anesthesia was provided with 1% Lidocaine. Under CT fluoroscopic guidance, a 17 gauge trocar needle was advanced to the margin of the lesion. Once needle tip position was confirmed, coaxial 18-gauge core biopsy samples were obtained, submitted in formalin to surgical pathology. The guide needle was removed. Postprocedure scans show no hemorrhage or other apparent complication. The patient tolerated the procedure well. COMPLICATIONS: None immediate FINDINGS: Nodular omental thickening in the left lower abdomen/pelvis. Representative core biopsy samples obtained as above. IMPRESSION: 1. Technically successful core biopsy of omental thickening. Electronically Signed   By: Lucrezia Europe M.D.   On: 18-Jun-2016 09:13     Scheduled Meds: . amLODipine  10 mg Oral Daily  . irbesartan   300 mg Oral Daily   And  . hydrochlorothiazide  12.5 mg Oral Daily  . insulin aspart  0-5 Units Subcutaneous QHS  . insulin aspart  0-9 Units Subcutaneous TID WC  . insulin detemir  15 Units Subcutaneous QHS  . levofloxacin (LEVAQUIN) IV  750 mg Intravenous Q48H  . metronidazole  500 mg Intravenous Q8H   Continuous Infusions: . sodium chloride 100 mL/hr at 05/26/16 2145     LOS: 5 days   Time Spent in minutes   30 minutes  Eisha Chatterjee D.O. on 06/18/16 at 1:15 PM  Between 7am to 7pm - Pager - [phone]  After 7pm go to [email or web address] - password [email or web address]  And look for the night coverage person covering for me after  hours  [facility]  [phone]

## 2016-05-28 ENCOUNTER — Inpatient Hospital Stay (HOSPITAL_COMMUNITY): Payer: Medicare Other

## 2016-05-28 ENCOUNTER — Encounter (HOSPITAL_COMMUNITY)
Admission: EM | Disposition: A | Discharge status: Home / Self Care | Payer: Self-pay | Source: Home / Self Care | Attending: Internal Medicine

## 2016-05-28 ENCOUNTER — Inpatient Hospital Stay (HOSPITAL_COMMUNITY): Payer: Medicare Other | Admitting: Anesthesiology

## 2016-05-28 ENCOUNTER — Encounter (HOSPITAL_COMMUNITY): Payer: Self-pay | Admitting: *Deleted

## 2016-05-28 DIAGNOSIS — K831 Obstruction of bile duct: Secondary | ICD-10-CM

## 2016-05-28 DIAGNOSIS — C801 Malignant (primary) neoplasm, unspecified: Secondary | ICD-10-CM

## 2016-05-28 DIAGNOSIS — C221 Intrahepatic bile duct carcinoma: Secondary | ICD-10-CM

## 2016-05-28 DIAGNOSIS — K828 Other specified diseases of gallbladder: Secondary | ICD-10-CM

## 2016-05-28 DIAGNOSIS — N390 Urinary tract infection, site not specified: Secondary | ICD-10-CM

## 2016-05-28 DIAGNOSIS — R7989 Other specified abnormal findings of blood chemistry: Secondary | ICD-10-CM

## 2016-05-28 HISTORY — PX: ERCP: SHX5425

## 2016-05-28 LAB — CBC
HEMATOCRIT: 28.8 % — AB (ref 36.0–46.0)
HEMOGLOBIN: 9.8 g/dL — AB (ref 12.0–15.0)
MCH: 25.6 pg — ABNORMAL LOW (ref 26.0–34.0)
MCHC: 34 g/dL (ref 30.0–36.0)
MCV: 75.2 fL — ABNORMAL LOW (ref 78.0–100.0)
Platelets: 353 10*3/uL (ref 150–400)
RBC: 3.83 MIL/uL — ABNORMAL LOW (ref 3.87–5.11)
RDW: 17.3 % — ABNORMAL HIGH (ref 11.5–15.5)
WBC: 12.7 10*3/uL — ABNORMAL HIGH (ref 4.0–10.5)

## 2016-05-28 LAB — COMPREHENSIVE METABOLIC PANEL
ALBUMIN: 2.5 g/dL — AB (ref 3.5–5.0)
ALK PHOS: 364 U/L — AB (ref 38–126)
ALT: 76 U/L — AB (ref 14–54)
ANION GAP: 9 (ref 5–15)
AST: 99 U/L — ABNORMAL HIGH (ref 15–41)
BUN: 16 mg/dL (ref 6–20)
CALCIUM: 8.8 mg/dL — AB (ref 8.9–10.3)
CHLORIDE: 108 mmol/L (ref 101–111)
CO2: 22 mmol/L (ref 22–32)
Creatinine, Ser: 1.14 mg/dL — ABNORMAL HIGH (ref 0.44–1.00)
GFR calc Af Amer: 53 mL/min — ABNORMAL LOW (ref >60)
GFR calc non Af Amer: 46 mL/min — ABNORMAL LOW (ref >60)
GLUCOSE: 91 mg/dL (ref 65–99)
Potassium: 3 mmol/L — ABNORMAL LOW (ref 3.5–5.1)
SODIUM: 139 mmol/L (ref 135–145)
Total Bilirubin: 7.8 mg/dL — ABNORMAL HIGH (ref 0.3–1.2)
Total Protein: 7.1 g/dL (ref 6.5–8.1)

## 2016-05-28 LAB — GLUCOSE, CAPILLARY
GLUCOSE-CAPILLARY: 65 mg/dL (ref 65–99)
GLUCOSE-CAPILLARY: 233 mg/dL — AB (ref 65–99)
GLUCOSE-CAPILLARY: 349 mg/dL — AB (ref 65–99)
Glucose-Capillary: 68 mg/dL (ref 65–99)
Glucose-Capillary: 254 mg/dL — ABNORMAL HIGH (ref 65–99)

## 2016-05-28 SURGERY — ERCP, WITH INTERVENTION IF INDICATED
Anesthesia: General

## 2016-05-28 MED ORDER — INDOMETHACIN 50 MG RE SUPP
RECTAL | Status: DC | PRN
  Administered 2016-05-28 (×2): 50 mg via RECTAL

## 2016-05-28 MED ORDER — SUCCINYLCHOLINE CHLORIDE 200 MG/10ML IV SOSY
PREFILLED_SYRINGE | INTRAVENOUS | Status: AC
  Filled 2016-05-28: qty 10

## 2016-05-28 MED ORDER — CIPROFLOXACIN IN D5W 400 MG/200ML IV SOLN
400.0000 mg | INTRAVENOUS | Status: AC
  Administered 2016-05-28: 400 mg via INTRAVENOUS

## 2016-05-28 MED ORDER — DEXTROSE 50 % IV SOLN
25.0000 mL | Freq: Once | INTRAVENOUS | Status: AC
  Administered 2016-05-28: 25 mL via INTRAVENOUS

## 2016-05-28 MED ORDER — LIDOCAINE 2% (20 MG/ML) 5 ML SYRINGE
INTRAMUSCULAR | Status: DC | PRN
  Administered 2016-05-28: 40 mg via INTRAVENOUS

## 2016-05-28 MED ORDER — PROPOFOL 10 MG/ML IV BOLUS
INTRAVENOUS | Status: AC
  Filled 2016-05-28: qty 20

## 2016-05-28 MED ORDER — INDOMETHACIN 50 MG RE SUPP
RECTAL | Status: AC
  Filled 2016-05-28: qty 2

## 2016-05-28 MED ORDER — ONDANSETRON HCL 4 MG/2ML IJ SOLN
INTRAMUSCULAR | Status: DC | PRN
  Administered 2016-05-28: 4 mg via INTRAVENOUS

## 2016-05-28 MED ORDER — PROPOFOL 10 MG/ML IV BOLUS
INTRAVENOUS | Status: DC | PRN
  Administered 2016-05-28: 120 mg via INTRAVENOUS

## 2016-05-28 MED ORDER — LACTATED RINGERS IV SOLN: INTRAVENOUS | Status: DC | PRN

## 2016-05-28 MED ORDER — LIDOCAINE 2% (20 MG/ML) 5 ML SYRINGE
INTRAMUSCULAR | Status: AC
  Filled 2016-05-28: qty 5

## 2016-05-28 MED ORDER — SUCCINYLCHOLINE CHLORIDE 200 MG/10ML IV SOSY
PREFILLED_SYRINGE | INTRAVENOUS | Status: DC | PRN
  Administered 2016-05-28: 100 mg via INTRAVENOUS

## 2016-05-28 MED ORDER — PHENYLEPHRINE HCL 10 MG/ML IJ SOLN
INTRAMUSCULAR | Status: DC | PRN
  Administered 2016-05-28 (×2): 80 ug via INTRAVENOUS
  Administered 2016-05-28 (×2): 40 ug via INTRAVENOUS

## 2016-05-28 MED ORDER — PHENYLEPHRINE 40 MCG/ML (10ML) SYRINGE FOR IV PUSH (FOR BLOOD PRESSURE SUPPORT)
PREFILLED_SYRINGE | INTRAVENOUS | Status: AC
  Filled 2016-05-28: qty 10

## 2016-05-28 MED ORDER — SODIUM CHLORIDE 0.9 % IV SOLN
INTRAVENOUS | Status: DC
  Administered 2016-05-28: 09:00:00 via INTRAVENOUS

## 2016-05-28 MED ORDER — BISACODYL 5 MG PO TBEC
10.0000 mg | DELAYED_RELEASE_TABLET | Freq: Once | ORAL | Status: AC
  Administered 2016-05-28: 10 mg via ORAL
  Filled 2016-05-28: qty 2

## 2016-05-28 MED ORDER — GLUCAGON HCL RDNA (DIAGNOSTIC) 1 MG IJ SOLR
INTRAMUSCULAR | Status: AC
  Filled 2016-05-28: qty 1

## 2016-05-28 MED ORDER — SODIUM CHLORIDE 0.9 % IV SOLN
INTRAVENOUS | Status: DC | PRN
  Administered 2016-05-28: 30 mL

## 2016-05-28 MED ORDER — GLUCAGON HCL RDNA (DIAGNOSTIC) 1 MG IJ SOLR
INTRAMUSCULAR | Status: DC | PRN
  Administered 2016-05-28 (×2): .5 mg via INTRAVENOUS

## 2016-05-28 MED ORDER — INDOMETHACIN 50 MG RE SUPP
100.0000 mg | Freq: Once | RECTAL | Status: DC
  Filled 2016-05-28: qty 2

## 2016-05-28 MED ORDER — CIPROFLOXACIN IN D5W 400 MG/200ML IV SOLN
INTRAVENOUS | Status: AC
  Filled 2016-05-28: qty 200

## 2016-05-28 MED ORDER — POLYETHYLENE GLYCOL 3350 17 G PO PACK
34.0000 g | PACK | Freq: Once | ORAL | Status: AC
  Administered 2016-05-28: 34 g via ORAL
  Filled 2016-05-28: qty 2

## 2016-05-28 MED ORDER — SODIUM CHLORIDE 0.9 % IV SOLN
Freq: Once | INTRAVENOUS | Status: AC
  Administered 2016-05-28: 14:00:00 via INTRAVENOUS
  Filled 2016-05-28: qty 1000

## 2016-05-28 MED ORDER — DEXTROSE 50 % IV SOLN
INTRAVENOUS | Status: AC
  Filled 2016-05-28: qty 50

## 2016-05-28 SURGICAL SUPPLY — 1 items: Biliary Stent (Stent) ×3 IMPLANT

## 2016-05-28 NOTE — Anesthesia Procedure Notes (Signed)
Procedure Name: Intubation Date/Time: 05/28/2016 10:00 AM Performed by: Dione Booze Pre-anesthesia Checklist: Emergency Drugs available, Suction available, Patient being monitored and Patient identified Patient Re-evaluated:Patient Re-evaluated prior to inductionOxygen Delivery Method: Circle system utilized Preoxygenation: Pre-oxygenation with 100% oxygen Intubation Type: IV induction Ventilation: Mask ventilation without difficulty Laryngoscope Size: Mac and 4 Grade View: Grade I Number of attempts: 1 Airway Equipment and Method: Stylet Placement Confirmation: ETT inserted through vocal cords under direct vision,  positive ETCO2 and breath sounds checked- equal and bilateral Secured at: 21 cm Tube secured with: Tape Dental Injury: Teeth and Oropharynx as per pre-operative assessment

## 2016-05-28 NOTE — Progress Notes (Signed)
Patient resting in bed this morning.  States that she has no complaints, just anxious for procedure (ERCP) to be done.  Patient is NPO. No pain reported.

## 2016-05-28 NOTE — Interval H&P Note (Signed)
History and Physical Interval Note:  05/28/2016 9:38 AM  [name]  has presented today for surgery, with the diagnosis of jaundice  The various methods of treatment have been discussed with the patient and family. After consideration of risks, benefits and other options for treatment, the patient has consented to  Procedure(s): ENDOSCOPIC RETROGRADE CHOLANGIOPANCREATOGRAPHY (ERCP) (N/A) as a surgical intervention .  The patient's history has been reviewed, patient examined, no change in status, stable for surgery.  I have reviewed the patient's chart and labs.  Questions were answered to the patient's satisfaction.    Assessment:  1.  Obstructive jaundice with proximal CBD stricture. 2.  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  Plan:  1.  Endoscopic retrograde cholangiopancreatography.  Anticipated biliary stent placement.  Possible brushings. 2.  Risks (up to and including bleeding, infection, perforation, pancreatitis that can be complicated by infected necrosis and death), benefits (removal of stones, alleviating blockage, decreasing risk of cholangitis or choledocholithiasis-related pancreatitis), and alternatives (watchful waiting, percutaneous transhepatic cholangiography) of ERCP were explained to patient/family in detail and patient elects to proceed.  Landry Dyke

## 2016-05-28 NOTE — Anesthesia Preprocedure Evaluation (Signed)
Anesthesia Evaluation    Airway Mallampati: II  TM Distance: >3 FB Neck ROM: Full    Dental no notable dental hx. (+) Edentulous Upper, Edentulous Lower   Pulmonary former smoker,    Pulmonary exam normal breath sounds clear to auscultation       Cardiovascular hypertension, Pt. on medications Normal cardiovascular exam Rhythm:Regular Rate:Normal     Neuro/Psych    GI/Hepatic   Endo/Other  diabetes  Renal/GU      Musculoskeletal   Abdominal   Peds  Hematology   Anesthesia Other Findings   Reproductive/Obstetrics                             Anesthesia Physical Anesthesia Plan  ASA: III  Anesthesia Plan: General   Post-op Pain Management:    Induction: Intravenous  Airway Management Planned: Oral ETT  Additional Equipment:   Intra-op Plan:   Post-operative Plan: Extubation in OR  Informed Consent: I have reviewed the patients History and Physical, chart, labs and discussed the procedure including the risks, benefits and alternatives for the proposed anesthesia with the patient or authorized representative who has indicated his/her understanding and acceptance.   Dental advisory given  Plan Discussed with: CRNA  Anesthesia Plan Comments:         Anesthesia Quick Evaluation

## 2016-05-28 NOTE — Anesthesia Postprocedure Evaluation (Signed)
Anesthesia Post Note  Patient: [name]  Procedure(s) Performed: Procedure(s) (LRB): ENDOSCOPIC RETROGRADE CHOLANGIOPANCREATOGRAPHY (ERCP) (N/A)  Patient location during evaluation: Endoscopy Anesthesia Type: General Level of consciousness: awake and alert Pain management: pain level controlled Vital Signs Assessment: post-procedure vital signs reviewed and stable Respiratory status: spontaneous breathing, nonlabored ventilation, respiratory function stable and patient connected to nasal cannula oxygen Cardiovascular status: blood pressure returned to baseline and stable Postop Assessment: no signs of nausea or vomiting Anesthetic complications: no       Last Vitals:  Vitals:   05/28/16 1210 05/28/16 1215  BP: (!) 179/69   Pulse: 83 84  Resp: 17 15  Temp:      Last Pain:  Vitals:   05/28/16 1147  TempSrc: Oral  PainSc:                  Montez Hageman

## 2016-05-28 NOTE — Progress Notes (Signed)
PROGRESS NOTE    [name]  [id number] DOB: 05/19/1940 DOA: 05/22/2016 PCP: Lilian Coma, MD   Chief Complaint  Patient presents with  . Vaginal Bleeding    Brief Narrative:   76 y.o. woman with a history of HTN, DM, and CKD 3 who presents to the ED with multiple complaints, Was noticed to have jaundice, very likely obstructive jaundice secondary to cholangiocarcinoma, that is post omental biopsy by IR 1/15, ERCP with stent placement on 1/17.  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  She has had weight loss and loss of appetite; otherwise, asymptomatic. -Gastroenterology and general surgery consulted and appreciated -Started empirically on Levaquin, Flagyl -Given abnormal findings on CT scan with weight loss as well as vaginal bleeding, and for cancer -negative hepatitis panel -MRCP and abdominal MRI: Borderline intrahepatic duct dilatation, narrowing of common bile duct in the porta hepatis at the site of soft tissue signal lesion likely infiltrative adenopathy. Gallstones with nonspecific gallbladder fundal wall thickening suspicious for liver lesion -per general surgery, no role for surgery at this time. -Interventional radiology consulted s/p biopsy 05/26/2016- results pending -CEA 14.5 and [id number] -Spoke with Dr. Alvy Bimler- since biopsy results are pending, she will continue to follow from [place]- message has been sent to GI oncology coordinator - ERCP with biliary stent placment by Dr Paulita Fujita 1/17  Essential hypertension -Continue amlodipine, chlorothiazide, Avapro  Diabetes mellitus -Continue complaints scale, Levemir, CBG monitoring  Chronic kidney disease, stage III -Patient noted to have GFR in stage III 2015, mild dip in 2017, currently stage III -Creatinine currently 1.25, continue monitor CMP  Hypokalemia -Replacing, continue to monitor CMP  Possible  UTI -Urine culture showed multiple species -Continue Levaquin  Abnormal vaginal bleeding -Possibly related to the above -Outpatient follow up -Monitor CBC -Hemoglobin currently 10.3  Leukocytosis  -Possibly reactive vs ?UTI -WBC trending down -patient is currently on levaquin and flagyl -Continue to monitor   DVT Prophylaxis  SCDs  Code Status: Full  Family Communication:  Discussed with  multiple family members at bedside at bedside.   Disposition Plan:  Pending further workup.  Consultants Gastroenterology, [facility] surgery Interventional radiology Oncology   Procedures  CT core biopsy omentum by IR 1/15 ERCP with biliary stent placment by Dr Paulita Fujita 1/17  Antibiotics   Anti-infectives    Start     Dose/Rate Route Frequency Ordered Stop   05/28/16 1045  ciprofloxacin (CIPRO) IVPB 400 mg     400 mg 200 mL/hr over 60 Minutes Intravenous 60 min pre-op 05/28/16 0816 05/28/16 1110   05/23/16 0400  levofloxacin (LEVAQUIN) IVPB 750 mg     750 mg 100 mL/hr over 90 Minutes Intravenous Every 48 hours 05/23/16 0119     05/23/16 0026  metroNIDAZOLE (FLAGYL) IVPB 500 mg     500 mg 100 mL/hr over 60 Minutes Intravenous Every 8 hours 05/23/16 0026     05/22/16 2045  metroNIDAZOLE (FLAGYL) IVPB 500 mg     500 mg 100 mL/hr over 60 Minutes Intravenous  Once 05/22/16 2043 05/23/16 0042   05/22/16 1930  ciprofloxacin (CIPRO) tablet 500 mg     500 mg Oral  Once 05/22/16 1924 05/22/16 2019      Subjective:   [name] seen and examined today. Denies current abdominal pain, nausea, vomiting, chest pain, shortness of breath, chest pain, dizziness, headache.   Objective:   Vitals:   05/28/16 1230 05/28/16 1330 05/28/16 1430  05/28/16 1515  BP: (!) 170/79 (!) 143/62 (!) 138/52   Pulse: 82 88 90   Resp: 16 16 16    Temp: 98.3 F (36.8 C) 98.2 F (36.8 C) 97.9 F (36.6 C)   TempSrc: Oral Oral Oral   SpO2: 99% 100%  100%  Weight:      Height:         Intake/Output Summary (Last 24 hours) at 05/28/16 1532 Last data filed at 05/28/16 1136  Gross per 24 hour  Intake             2000 ml  Output              625 ml  Net             1375 ml   Filed Weights   05/22/16 1048 05/22/16 2145 05/28/16 0904  Weight: 80.3 kg (177 lb) 78.5 kg (173 lb) 78.5 kg (173 lb)    Exam  General: Well developed, well nourished, NAD, appears stated age  35: NCAT, scleral icterus, mucous membranes moist.   Cardiovascular: S1 S2 auscultated, RRR, no murmurs  Respiratory: Clear to auscultation bilaterally with equal chest rise  Abdomen: Soft, nontender, nondistended, + bowel sounds  Extremities: warm dry without cyanosis clubbing or edema  Neuro: AAOx3, nonfocal  Psych: Appropriate mood and affect, pleasant   Data Reviewed: I have personally reviewed following labs and imaging studies  CBC:  Recent Labs Lab 05/24/16 0453 05/25/16 0437 05/26/16 0843 05/27/16 0509 05/28/16 0500  WBC 11.3* 12.4* 14.1* 11.6* 12.7*  HGB 10.4* 10.4* 10.6* 10.3* 9.8*  HCT 30.9* 30.6* 31.2* 29.8* 28.8*  MCV 76.3* 75.9* 75.5* 75.8* 75.2*  PLT 357 366 363 348 0000000   Basic Metabolic Panel:  Recent Labs Lab 05/24/16 0453 05/25/16 0437 05/26/16 0843 05/27/16 0509 05/28/16 0500  NA 140 137 138 136 139  K 3.3* 3.6 3.2* 3.4* 3.0*  CL 109 108 106 105 108  CO2 20* 19* 22 21* 22  GLUCOSE 193* 183* 138* 223* 91  BUN 21* 16 15 16 16   CREATININE 1.40* 1.30* 1.38* 1.25* 1.14*  CALCIUM 8.6* 8.5* 8.8* 8.6* 8.8*   GFR: Estimated Creatinine Clearance: 46 mL/min (by C-G formula based on SCr of 1.14 mg/dL (H)). Liver Function Tests:  Recent Labs Lab 05/22/16 1736 05/24/16 0453 05/25/16 0437 05/27/16 0509 05/28/16 0500  AST 177* 147* 144* 106* 99*  ALT 213* 161* 141* 95* 76*  ALKPHOS 623* 515* 490* 425* 364*  BILITOT 7.6* 6.7* 6.8* 7.7* 7.8*  PROT 9.1* 7.7 7.5 7.8 7.1  ALBUMIN 3.5 2.9* 2.8* 2.7* 2.5*   No results for input(s): LIPASE, AMYLASE in  the last 168 hours. No results for input(s): AMMONIA in the last 168 hours. Coagulation Profile:  Recent Labs Lab 05/23/16 0439 05/25/16 0437  INR 1.02 1.11   Cardiac Enzymes: No results for input(s): CKTOTAL, CKMB, CKMBINDEX, TROPONINI in the last 168 hours. BNP (last 3 results) No results for input(s): PROBNP in the last 8760 hours. HbA1C: No results for input(s): HGBA1C in the last 72 hours. CBG:  Recent Labs Lab 05/27/16 1621 05/27/16 2226 05/28/16 0804 05/28/16 0908 05/28/16 1152  GLUCAP 99 150* 65 68 254*   Lipid Profile: No results for input(s): CHOL, HDL, LDLCALC, TRIG, CHOLHDL, LDLDIRECT in the last 72 hours. Thyroid Function Tests: No results for input(s): TSH, T4TOTAL, FREET4, T3FREE, THYROIDAB in the last 72 hours. Anemia Panel: No results for input(s): VITAMINB12, FOLATE, FERRITIN, TIBC, IRON, RETICCTPCT in the last 72 hours.  Urine analysis:    Component Value Date/Time   COLORURINE AMBER (A) 05/22/2016 1700   APPEARANCEUR CLOUDY (A) 05/22/2016 1700   LABSPEC 1.012 05/22/2016 1700   PHURINE 5.0 05/22/2016 1700   GLUCOSEU 150 (A) 05/22/2016 1700   HGBUR LARGE (A) 05/22/2016 1700   BILIRUBINUR SMALL (A) 05/22/2016 1700   KETONESUR NEGATIVE 05/22/2016 1700   PROTEINUR 100 (A) 05/22/2016 1700   UROBILINOGEN 0.2 12/03/2013 2155   NITRITE NEGATIVE 05/22/2016 1700   LEUKOCYTESUR LARGE (A) 05/22/2016 1700   Sepsis Labs: @LABRCNTIP (procalcitonin:4,lacticidven:4)  ) Recent Results (from the past 240 hour(s))  Urine culture     Status: Abnormal   Collection Time: 05/22/16  5:00 PM  Result Value Ref Range Status   Specimen Description URINE, RANDOM  Final   Special Requests NONE  Final   Culture MULTIPLE SPECIES PRESENT, SUGGEST RECOLLECTION (A)  Final   Report Status 05/24/2016 FINAL  Final      Radiology Studies: Ct Chest Wo Contrast  Result Date: 05/27/2016 CLINICAL DATA:  Omental metastatic disease, possible metastatic disease within chest EXAM:  CT CHEST WITHOUT CONTRAST TECHNIQUE: Multidetector CT imaging of the chest was performed following the standard protocol without IV contrast. COMPARISON:  Images of the lung bases 05/22/2014 FINDINGS: Cardiovascular: Cardiac size within normal limits. No pericardial effusion. Atherosclerotic calcifications of thoracic aorta. Mediastinum/Nodes: Precarinal lymph node Measures 1cm short-axis. No definite hilar adenopathy is noted on this unenhanced scan. Central airways are patent. Lungs/Pleura: Images of the lung parenchyma shows no pulmonary edema. No focal consolidation. No pulmonary nodules are noted. There is small right pleural effusion and right base posterior atelectasis. No bronchiectasis or emphysematous changes. No fibrotic changes are noted. Upper Abdomen: Again noted irregular gallbladder fundus and some thickening of the wall of the gallbladder. Neoplastic process cannot be excluded. Probable lymph node anterior to porta hepatis measures 1.2 cm pathologic suspicious for adenopathy. No adrenal gland mass is noted. Musculoskeletal: No destructive bony lesions are noted. Sagittal images of the spine shows degenerative changes thoracic spine. There is left axillary lymph node measures 1.2 cm short-axis. Second left axillary lymph node measures 1.2 cm short-axis. IMPRESSION: 1. There is borderline precarinal lymph node measures 1 cm short-axis. No hilar adenopathy is noted. Mild enlarged left axillary lymph nodes. Pathologic adenopathy cannot be excluded. Clinical correlation is necessary. 2. No infiltrate or pulmonary edema. No pulmonary nodules are noted. 3. Small right pleural effusion with right base posterior atelectasis. 4. No destructive bony lesions are noted. 5. Again noted irregular thickening of gallbladder wall and irregular appearance of gallbladder fundus. Neoplastic process cannot be excluded. Clinical correlation is necessary. 6. No adrenal gland mass. Again noted probable pathologic adenopathy  in porta hepatic region. Electronically Signed   By: Lahoma Crocker M.D.   On: 05/27/2016 16:13   Ct Biopsy  Result Date: 05/27/2016 CLINICAL DATA:  omental thickening/nodularity as on CT. Thisis highly suspicious for peritoneal metastasis. Potential primariesinclude ovarian, gallbladder, or an otherwise unknown primary EXAM: CT GUIDED CORE BIOPSY OF OMENTUM ANESTHESIA/SEDATION: Intravenous Fentanyl and Versed were administered as conscious sedation during continuous monitoring of the patient's level of consciousness and physiological / cardiorespiratory status by the radiology RN, with a total moderate sedation time of 10 minutes. PROCEDURE: The procedure risks, benefits, and alternatives were explained to the patient. Questions regarding the procedure were encouraged and answered. The patient understands and consents to the procedure. Select axial scans through the lower abdomen were obtained. The omental disease was localized and an appropriate skin entry  site determined and marked. The operative field was prepped with chlorhexidinein a sterile fashion, and a sterile drape was applied covering the operative field. A sterile gown and sterile gloves were used for the procedure. Local anesthesia was provided with 1% Lidocaine. Under CT fluoroscopic guidance, a 17 gauge trocar needle was advanced to the margin of the lesion. Once needle tip position was confirmed, coaxial 18-gauge core biopsy samples were obtained, submitted in formalin to surgical pathology. The guide needle was removed. Postprocedure scans show no hemorrhage or other apparent complication. The patient tolerated the procedure well. COMPLICATIONS: None immediate FINDINGS: Nodular omental thickening in the left lower abdomen/pelvis. Representative core biopsy samples obtained as above. IMPRESSION: 1. Technically successful core biopsy of omental thickening. Electronically Signed   By: Lucrezia Europe M.D.   On: 05/27/2016 09:13   Dg Ercp With  Sphincterotomy  Result Date: 05/28/2016 CLINICAL DATA:  Obstructive jaundice EXAM: ERCP TECHNIQUE: Multiple spot images obtained with the fluoroscopic device and submitted for interpretation post-procedure. FLUOROSCOPY TIME:  Fluoroscopy Time:  6 minutes and 25 seconds Radiation Exposure Index (if provided by the fluoroscopic device): 93.84 mGy Number of Acquired Spot Images: 9 COMPARISON:  None. FINDINGS: There is narrowing of the common hepatic duct in the porta hepatis. A biliary stent has been placed across the high common hepatic duct stricture hand across the ampulla. IMPRESSION: Successful biliary stent placement for upper common hepatic duct stricture. These images were submitted for radiologic interpretation only. Please see the procedural report for the amount of contrast and the fluoroscopy time utilized. Electronically Signed   By: Marybelle Killings M.D.   On: 05/28/2016 12:15     Scheduled Meds: . amLODipine  10 mg Oral Daily  . irbesartan  300 mg Oral Daily   And  . hydrochlorothiazide  12.5 mg Oral Daily  . indomethacin  100 mg Rectal Once  . insulin aspart  0-5 Units Subcutaneous QHS  . insulin aspart  0-9 Units Subcutaneous TID WC  . insulin detemir  15 Units Subcutaneous QHS  . levofloxacin (LEVAQUIN) IV  750 mg Intravenous Q48H  . metronidazole  500 mg Intravenous Q8H   Continuous Infusions:    LOS: 6 days     Etna Forquer MD. on 05/28/2016 at 3:32 PM  Between 7am to 7pm - Pager - [phone]  After 7pm go to [email or web address] - password [email or web address]  And look for the night coverage person covering for me after hours  [facility] Office  [phone]

## 2016-05-28 NOTE — Transfer of Care (Signed)
Immediate Anesthesia Transfer of Care Note  Patient: [name]  Procedure(s) Performed: Procedure(s): ENDOSCOPIC RETROGRADE CHOLANGIOPANCREATOGRAPHY (ERCP) (N/A)  Patient Location: PACU and Endoscopy Unit  Anesthesia Type:General  Level of Consciousness: awake, alert  and patient cooperative  Airway & Oxygen Therapy: Patient Spontanous Breathing and Patient connected to face mask oxygen  Post-op Assessment: Report given to RN and Post -op Vital signs reviewed and stable  Post vital signs: Reviewed and stable  Last Vitals:  Vitals:   05/28/16 0549 05/28/16 0904  BP: 97/66 (!) 190/68  Pulse: 89 92  Resp: 18 15  Temp: 37.2 C 36.7 C    Last Pain:  Vitals:   05/28/16 0904  TempSrc: Oral  PainSc:          Complications: No apparent anesthesia complications

## 2016-05-28 NOTE — Op Note (Addendum)
[facility] Patient Name: [name] Procedure Date: 05/28/2016 MRN: [id number] Attending MD: Arta Silence , MD Date of Birth: 09/18/1940 CSN: [id number] Age: 76 Admit Type: Inpatient Procedure:                ERCP Indications:              Biliary stricture on magnetic resonance                            cholangiopancreatography, Jaundice Providers:                Otis Brace, MD, Arta Silence, MD, Burtis Junes, RN, Cherylynn Ridges, Technician, Dione Booze, CRNA, Otis Brace, MD Referring MD:              Medicines:                General Anesthesia, ciprofloxacin 400 mg IV,                            Indomethacin 123XX123 mg PR Complications:            No immediate complications. Estimated Blood Loss:     Estimated blood loss was minimal. Procedure:                Pre-Anesthesia Assessment:                           - Prior to the procedure, a History and Physical                            was performed, and patient medications and                            allergies were reviewed. The patient's tolerance of                            previous anesthesia was also reviewed. The risks                            and benefits of the procedure and the sedation                            options and risks were discussed with the patient.                            All questions were answered, and informed consent                            was obtained. Prior Anticoagulants: The patient has                            taken no previous  anticoagulant or antiplatelet                            agents. ASA Grade Assessment: II - A patient with                            mild systemic disease. After reviewing the risks                            and benefits, the patient was deemed in                            satisfactory condition to undergo the procedure.                           After obtaining  informed consent, the scope was                            passed under direct vision. Throughout the                            procedure, the patient's blood pressure, pulse, and                            oxygen saturations were monitored continuously. The                            ERCP was introduced through the mouth, and used to                            inject contrast into and used to cannulate the bile                            duct. The ERCP was technically difficult and                            complex due to challenging cannulation because of                            papillary stenosis. The patient tolerated the                            procedure well. Scope In: Scope Out: Findings:      The scout film was normal. A wire was passed into the biliary tree.       There was sensation of papillary stenosis. The bile duct was deeply       cannulated. Contrast was injected. I personally interpreted the bile       duct images. Image quality was adequate. The upper third of the main       bile duct contained a tight 1-2 cm long stricture, right near the level       of the cystic duct insertion. Intrahepatic ducts were prominent upstream       of the stricture, and peripheral portions of the intrahepatics were  noted to have some beading of intrahepatic ducts suspicious for PSC.       Mid- and distal CBD normal diameter. No bile duct stones noted. The       proximal CBD stricture was dilated with 4 [place] by 4 mm balloon.       Biliary sphincterotomy was made with a traction (standard)       sphincterotome. There was no post-sphincterotomy bleeding. One 10 Fr by       6 cm covered metal stent was placed into the common bile duct. The stent       was in good position. Waists at the proximal CBD stricture and distal       bile duct were noted. 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                            - The upper third of the main bile duct was dilated.                           - A biliary sphincterotomy was performed.                           - One covered metal stent was placed into the                            common bile duct.                           - Overall constellation of findings most consistent                            with metastatic cholangiocarcinoma (or gallbladder                            cancer) with underlying PSC. Moderate Sedation:      None Recommendation:           - Avoid aspirin and nonsteroidal anti-inflammatory                            medicines for 3 days.                           - Watch for pancreatitis, bleeding, perforation,                            and cholangitis.                           - Continue present medications.                           - Observe patient's clinical course.                           -  Await path results.                           - Refer to an oncologist at appointment to be                            scheduled.                           - Clear liquid diet today.                           - Return patient to hospital ward for ongoing care. Procedure Code(s):        --- Professional ---                           [phone], Endoscopic retrograde                            cholangiopancreatography (ERCP); with placement of                            endoscopic stent into biliary or pancreatic duct,                            including pre- and post-dilation and guide wire                            passage, when performed, including sphincterotomy,                            when performed, each stent                           43277, 85, Endoscopic retrograde                            cholangiopancreatography (ERCP); with                            trans-endoscopic balloon dilation of                            biliary/pancreatic duct(s) or of ampulla                            (sphincteroplasty), including  sphincterotomy, when                            performed, each duct                           43262, 59, Endoscopic retrograde                            cholangiopancreatography (ERCP); with  sphincterotomy/papillotomy Diagnosis Code(s):        --- Professional ---                           K83.1, Obstruction of bile duct                           R17, Unspecified jaundice                           K83.8, Other specified diseases of biliary tract CPT copyright 2016 [employer]. All rights reserved. The codes documented in this report are preliminary and upon coder review may  be revised to meet current compliance requirements. Arta Silence, MD 05/28/2016 11:49:02 AM This report has been signed electronically. Otis Brace, MD Otis Brace, MD 05/28/2016 11:55:22 AM Number of Addenda: 0

## 2016-05-29 ENCOUNTER — Encounter (HOSPITAL_COMMUNITY): Payer: Self-pay | Admitting: Gastroenterology

## 2016-05-29 LAB — BASIC METABOLIC PANEL
Anion gap: 9 (ref 5–15)
BUN: 18 mg/dL (ref 6–20)
CO2: 22 mmol/L (ref 22–32)
CREATININE: 1.13 mg/dL — AB (ref 0.44–1.00)
Calcium: 9 mg/dL (ref 8.9–10.3)
Chloride: 108 mmol/L (ref 101–111)
GFR calc Af Amer: 54 mL/min — ABNORMAL LOW (ref >60)
GFR calc non Af Amer: 46 mL/min — ABNORMAL LOW (ref >60)
GLUCOSE: 182 mg/dL — AB (ref 65–99)
POTASSIUM: 3.8 mmol/L (ref 3.5–5.1)
SODIUM: 139 mmol/L (ref 135–145)

## 2016-05-29 LAB — GLUCOSE, CAPILLARY
GLUCOSE-CAPILLARY: 179 mg/dL — AB (ref 65–99)
GLUCOSE-CAPILLARY: 250 mg/dL — AB (ref 65–99)
GLUCOSE-CAPILLARY: 163 mg/dL — AB (ref 65–99)
Glucose-Capillary: 115 mg/dL — ABNORMAL HIGH (ref 65–99)

## 2016-05-29 LAB — HEPATIC FUNCTION PANEL
ALK PHOS: 378 U/L — AB (ref 38–126)
ALT: 72 U/L — AB (ref 14–54)
AST: 101 U/L — ABNORMAL HIGH (ref 15–41)
Albumin: 2.7 g/dL — ABNORMAL LOW (ref 3.5–5.0)
BILIRUBIN DIRECT: 5.1 mg/dL — AB (ref 0.1–0.5)
BILIRUBIN INDIRECT: 3.5 mg/dL — AB (ref 0.3–0.9)
BILIRUBIN TOTAL: 8.6 mg/dL — AB (ref 0.3–1.2)
Total Protein: 7.7 g/dL (ref 6.5–8.1)

## 2016-05-29 NOTE — Progress Notes (Signed)
Patient in bathroom and moving through out room comfortably with no complaints of pain.  Advance diet to soft carb modified diet if bilirubin decreased, per Dr. Waldron Labs.

## 2016-05-29 NOTE — Evaluation (Signed)
Physical Therapy Evaluation Patient Details Name: [name] MRN: [id number] DOB: 07-Sep-1940 Today's Date: 05/29/2016   History of Present Illness   76 y.o. woman with a history of HTN, DM, and CKD 3 who presents to the ED with multiple complaints, Was noticed to have jaundice, very likely obstructive jaundice secondary to cholangiocarcinoma, that is post omental biopsy by IR 1/15, ERCP with stent placement on 1/17.   Clinical Impression  Pt is independent with mobility. She ambulated 200' without an assistive device with no loss of balance. No further PT indicated, no DME nor follow up PT needs, will sign off. Encouraged pt to ambulate in halls at least BID to prevent deconditioning    Follow Up Recommendations No PT follow up    Equipment Recommendations  None recommended by PT    Recommendations for Other Services       Precautions / Restrictions Precautions Precautions: None Restrictions Weight Bearing Restrictions: No      Mobility  Bed Mobility Overal bed mobility: Independent                Transfers Overall transfer level: Independent                  Ambulation/Gait Ambulation/Gait assistance: Independent Ambulation Distance (Feet): 200 Feet Assistive device: None Gait Pattern/deviations: WFL(Within Functional Limits)     General Gait Details: steady without AD, no LOB  Stairs            Wheelchair Mobility    Modified Rankin (Stroke Patients Only)       Balance Overall balance assessment: No apparent balance deficits (not formally assessed)                                           Pertinent Vitals/Pain Pain Assessment: No/denies pain    Home Living Family/patient expects to be discharged to:: Private residence Living Arrangements: Spouse/significant other Available Help at Discharge: Family;Available 24 hours/day Type of Home: House       Home Layout: One level Home Equipment: None       Prior Function Level of Independence: Independent         Comments: drives, walks without AD, independent ADLs     Hand Dominance        Extremity/Trunk Assessment   Upper Extremity Assessment Upper Extremity Assessment: Overall WFL for tasks assessed    Lower Extremity Assessment Lower Extremity Assessment: Overall WFL for tasks assessed    Cervical / Trunk Assessment Cervical / Trunk Assessment: Normal  Communication   Communication: No difficulties  Cognition Arousal/Alertness: Awake/alert Behavior During Therapy: WFL for tasks assessed/performed Overall Cognitive Status: Within Functional Limits for tasks assessed                      General Comments      Exercises     Assessment/Plan    PT Assessment Patent does not need any further PT services  PT Problem List            PT Treatment Interventions      PT Goals (Current goals can be found in the Care Plan section)  Acute Rehab PT Goals PT Goal Formulation: All assessment and education complete, DC therapy    Frequency     Barriers to discharge        Co-evaluation  End of Session Equipment Utilized During Treatment: Gait belt Activity Tolerance: Patient tolerated treatment well Patient left: in bed;with call bell/phone within reach;with family/visitor present Nurse Communication: Mobility status         Time: 1419-1430 PT Time Calculation (min) (ACUTE ONLY): 11 min   Charges:   PT Evaluation $PT Eval Low Complexity: 1 Procedure     PT G Codes:        [name] 05/29/2016, 2:34 PM [phone]

## 2016-05-29 NOTE — Progress Notes (Signed)
Inpatient Diabetes Program Recommendations  AACE/ADA: New Consensus Statement on Inpatient Glycemic Control (2015)  Target Ranges:  Prepandial:   less than 140 mg/dL      Peak postprandial:   less than 180 mg/dL (1-2 hours)      Critically ill patients:  140 - 180 mg/dL   Results for AEON, TOFTE (MRN [id number]) as of 05/29/2016 11:36  Ref. Range 05/28/2016 08:04 05/28/2016 09:08 05/28/2016 11:52 05/28/2016 16:43 05/28/2016 22:40 05/29/2016 08:21  Glucose-Capillary Latest Ref Range: 65 - 99 mg/dL 65 68 254 (H) 233 (H) 349 (H) 179 (H)   Review of Glycemic Control  Current orders for Inpatient glycemic control: Levemir 15 units QHS, Novolog 0-9 units TID with meals, Novolog 0-5 units QHS  Inpatient Diabetes Program Recommendations:  Insulin - Basal: Fasting glucose 65 mg/dl on 05/28/16 and noted patient received Glucagon 0.5 mg x 2 following hypoglycemia which lead to hyperglycemia note don 05/28/16. Do not recommend any changes with Levemir or Novolog at this time.  Thanks, Barnie Alderman, RN, MSN, CDE Diabetes Coordinator Inpatient Diabetes Program [phone] (Team Pager from 8am to 5pm)

## 2016-05-29 NOTE — Progress Notes (Signed)
PROGRESS NOTE    [name]  [id number] DOB: 12-17-1940 DOA: 05/22/2016 PCP: Lilian Coma, MD   Chief Complaint  Patient presents with  . Vaginal Bleeding    Brief Narrative:   76 y.o. woman with a history of HTN, DM, and CKD 3 who presents to the ED with multiple complaints, Was noticed to have jaundice, very likely obstructive jaundice secondary to cholangiocarcinoma, that is post omental biopsy by IR 1/15, ERCP with stent placement on 1/17.  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  She has had weight loss and loss of appetite; otherwise, asymptomatic. - In particular levofloxacin and Flagyl which has been stopped 1/18 -negative hepatitis panel -MRCP and abdominal MRI: Borderline intrahepatic duct dilatation, narrowing of common bile duct in the porta hepatis at the site of soft tissue signal lesion likely infiltrative adenopathy. Gallstones with nonspecific gallbladder fundal wall thickening suspicious for liver lesion -per general surgery, no role for surgery at this time. -Interventional radiology consulted s/p biopsy 05/26/2016- results pending -CEA 14.5 and [id number] -Spoke with Dr. Alvy Bimler- Dr. Learta Codding will arrange for outpatient follow-up next week - ERCP with biliary stent placment by Dr Paulita Fujita 1/17, tolerating full liquid diet, advance to soft diet today  Essential hypertension -Continue amlodipine, chlorothiazide, Avapro  Diabetes mellitus -Continue complaints scale, Levemir, CBG monitoring  Chronic kidney disease, stage III -Patient noted to have GFR in stage III 2015, mild dip in 2017, currently stage III -Creatinine currently 1.25, continue monitor CMP  Hypokalemia -Replacing, continue to monitor CMP  Possible UTI -Urine culture showed multiple species -treated with  Levaquin  Abnormal vaginal bleeding -Possibly related to the above -Outpatient  follow up -Monitor CBC -Hemoglobin currently 10.3  Leukocytosis  -Possibly reactive vs ?UTI -WBC trending down -patient is currently on levaquin and flagyl -Continue to monitor   DVT Prophylaxis  SCDs  Code Status: Full  Family Communication:  none at bedside.   Disposition Plan:  Pending further workup.  Consultants Gastroenterology, [place] surgery Interventional radiology Oncology   Procedures  CT core biopsy omentum by IR 1/15 ERCP with biliary stent placment by Dr Paulita Fujita 1/17  Antibiotics   Anti-infectives    Start     Dose/Rate Route Frequency Ordered Stop   05/28/16 1045  ciprofloxacin (CIPRO) IVPB 400 mg     400 mg 200 mL/hr over 60 Minutes Intravenous 60 min pre-op 05/28/16 0816 05/28/16 1110   05/23/16 0400  levofloxacin (LEVAQUIN) IVPB 750 mg  Status:  Discontinued     750 mg 100 mL/hr over 90 Minutes Intravenous Every 48 hours 05/23/16 0119 05/29/16 0823   05/23/16 0026  metroNIDAZOLE (FLAGYL) IVPB 500 mg  Status:  Discontinued     500 mg 100 mL/hr over 60 Minutes Intravenous Every 8 hours 05/23/16 0026 05/29/16 0823   05/22/16 2045  metroNIDAZOLE (FLAGYL) IVPB 500 mg     500 mg 100 mL/hr over 60 Minutes Intravenous  Once 05/22/16 2043 05/23/16 0042   05/22/16 1930  ciprofloxacin (CIPRO) tablet 500 mg     500 mg Oral  Once 05/22/16 1924 05/22/16 2019      Subjective:   [name] seen and examined today. Denies current abdominal pain, nausea, vomiting, chest pain, shortness of breath, chest pain, dizziness, headache. Tolerating full liquid diet, asked for advanced to be advanced  Objective:   Vitals:   05/28/16 1515 05/28/16 1530 05/29/16 0655 05/29/16 1400  BP:  (!) 143/67 Marland Kitchen)  142/61 (!) 152/86  Pulse:  90 72 (!) 103  Resp:  16 16 16   Temp:  98.5 F (36.9 C) 97.5 F (36.4 C) 97.3 F (36.3 C)  TempSrc:  Oral Oral Oral  SpO2: 100% 100% 100% 100%  Weight:      Height:        Intake/Output Summary (Last 24 hours) at 05/29/16  1610 Last data filed at 05/29/16 1517  Gross per 24 hour  Intake             1160 ml  Output              700 ml  Net              460 ml   Filed Weights   05/22/16 1048 05/22/16 2145 05/28/16 0904  Weight: 80.3 kg (177 lb) 78.5 kg (173 lb) 78.5 kg (173 lb)    Exam  General: Well developed, well nourished, NAD, appears stated age  24: NCAT, scleral icterus, mucous membranes moist.   Cardiovascular: S1 S2 auscultated, RRR, no murmurs  Respiratory: Clear to auscultation bilaterally with equal chest rise  Abdomen: Soft, nontender, nondistended, + bowel sounds  Extremities: warm dry without cyanosis clubbing or edema  Neuro: AAOx3, nonfocal  Psych: Appropriate mood and affect, pleasant   Data Reviewed: I have personally reviewed following labs and imaging studies  CBC:  Recent Labs Lab 05/24/16 0453 05/25/16 0437 05/26/16 0843 05/27/16 0509 05/28/16 0500  WBC 11.3* 12.4* 14.1* 11.6* 12.7*  HGB 10.4* 10.4* 10.6* 10.3* 9.8*  HCT 30.9* 30.6* 31.2* 29.8* 28.8*  MCV 76.3* 75.9* 75.5* 75.8* 75.2*  PLT 357 366 363 348 0000000   Basic Metabolic Panel:  Recent Labs Lab 05/25/16 0437 05/26/16 0843 05/27/16 0509 05/28/16 0500 05/29/16 0830  NA 137 138 136 139 139  K 3.6 3.2* 3.4* 3.0* 3.8  CL 108 106 105 108 108  CO2 19* 22 21* 22 22  GLUCOSE 183* 138* 223* 91 182*  BUN 16 15 16 16 18   CREATININE 1.30* 1.38* 1.25* 1.14* 1.13*  CALCIUM 8.5* 8.8* 8.6* 8.8* 9.0   GFR: Estimated Creatinine Clearance: 46.4 mL/min (by C-G formula based on SCr of 1.13 mg/dL (H)). Liver Function Tests:  Recent Labs Lab 05/24/16 0453 05/25/16 0437 05/27/16 0509 05/28/16 0500 05/29/16 0830  AST 147* 144* 106* 99* 101*  ALT 161* 141* 95* 76* 72*  ALKPHOS 515* 490* 425* 364* 378*  BILITOT 6.7* 6.8* 7.7* 7.8* 8.6*  PROT 7.7 7.5 7.8 7.1 7.7  ALBUMIN 2.9* 2.8* 2.7* 2.5* 2.7*   No results for input(s): LIPASE, AMYLASE in the last 168 hours. No results for input(s): AMMONIA in the  last 168 hours. Coagulation Profile:  Recent Labs Lab 05/23/16 0439 05/25/16 0437  INR 1.02 1.11   Cardiac Enzymes: No results for input(s): CKTOTAL, CKMB, CKMBINDEX, TROPONINI in the last 168 hours. BNP (last 3 results) No results for input(s): PROBNP in the last 8760 hours. HbA1C: No results for input(s): HGBA1C in the last 72 hours. CBG:  Recent Labs Lab 05/28/16 1152 05/28/16 1643 05/28/16 2240 05/29/16 0821 05/29/16 1248  GLUCAP 254* 233* 349* 179* 250*   Lipid Profile: No results for input(s): CHOL, HDL, LDLCALC, TRIG, CHOLHDL, LDLDIRECT in the last 72 hours. Thyroid Function Tests: No results for input(s): TSH, T4TOTAL, FREET4, T3FREE, THYROIDAB in the last 72 hours. Anemia Panel: No results for input(s): VITAMINB12, FOLATE, FERRITIN, TIBC, IRON, RETICCTPCT in the last 72 hours. Urine analysis:    Component  Value Date/Time   COLORURINE AMBER (A) 05/22/2016 1700   APPEARANCEUR CLOUDY (A) 05/22/2016 1700   LABSPEC 1.012 05/22/2016 1700   PHURINE 5.0 05/22/2016 1700   GLUCOSEU 150 (A) 05/22/2016 1700   HGBUR LARGE (A) 05/22/2016 1700   BILIRUBINUR SMALL (A) 05/22/2016 1700   KETONESUR NEGATIVE 05/22/2016 1700   PROTEINUR 100 (A) 05/22/2016 1700   UROBILINOGEN 0.2 12/03/2013 2155   NITRITE NEGATIVE 05/22/2016 1700   LEUKOCYTESUR LARGE (A) 05/22/2016 1700   Sepsis Labs: @LABRCNTIP (procalcitonin:4,lacticidven:4)  ) Recent Results (from the past 240 hour(s))  Urine culture     Status: Abnormal   Collection Time: 05/22/16  5:00 PM  Result Value Ref Range Status   Specimen Description URINE, RANDOM  Final   Special Requests NONE  Final   Culture MULTIPLE SPECIES PRESENT, SUGGEST RECOLLECTION (A)  Final   Report Status 05/24/2016 FINAL  Final      Radiology Studies: Dg Ercp With Sphincterotomy  Result Date: 05/28/2016 CLINICAL DATA:  Obstructive jaundice EXAM: ERCP TECHNIQUE: Multiple spot images obtained with the fluoroscopic device and submitted for  interpretation post-procedure. FLUOROSCOPY TIME:  Fluoroscopy Time:  6 minutes and 25 seconds Radiation Exposure Index (if provided by the fluoroscopic device): 93.84 mGy Number of Acquired Spot Images: 9 COMPARISON:  None. FINDINGS: There is narrowing of the common hepatic duct in the porta hepatis. A biliary stent has been placed across the high common hepatic duct stricture hand across the ampulla. IMPRESSION: Successful biliary stent placement for upper common hepatic duct stricture. These images were submitted for radiologic interpretation only. Please see the procedural report for the amount of contrast and the fluoroscopy time utilized. Electronically Signed   By: Marybelle Killings M.D.   On: 05/28/2016 12:15     Scheduled Meds: . amLODipine  10 mg Oral Daily  . irbesartan  300 mg Oral Daily   And  . hydrochlorothiazide  12.5 mg Oral Daily  . indomethacin  100 mg Rectal Once  . insulin aspart  0-5 Units Subcutaneous QHS  . insulin aspart  0-9 Units Subcutaneous TID WC  . insulin detemir  15 Units Subcutaneous QHS   Continuous Infusions:    LOS: 7 days     Sebrina Kessner MD. on 05/29/2016 at 4:10 PM  Between 7am to 7pm - Pager - [phone]  After 7pm go to [email or web address] - password [email or web address]  And look for the night coverage person covering for me after hours  [facility] Office  [phone]

## 2016-05-29 NOTE — Progress Notes (Signed)
No problems today post ERCP which was done yesterday with biliary stent placement. She is eating okay. Denies abdominal pain. We are still awaiting pathology. I think she can probably go home tomorrow and follow-up as an outpatient. Once pathology is back she will most likely need oncology consultation.

## 2016-05-30 ENCOUNTER — Telehealth: Payer: Self-pay | Admitting: *Deleted

## 2016-05-30 LAB — GLUCOSE, CAPILLARY
GLUCOSE-CAPILLARY: 266 mg/dL — AB (ref 65–99)
Glucose-Capillary: 97 mg/dL (ref 65–99)

## 2016-05-30 MED ORDER — ENSURE ENLIVE PO LIQD: 237.0000 mL | Freq: Two times a day (BID) | ORAL | 12 refills | Status: AC

## 2016-05-30 MED ORDER — OXYCODONE HCL 5 MG PO TABS: 2.5000 mg | ORAL_TABLET | Freq: Four times a day (QID) | ORAL | 0 refills | Status: DC | PRN

## 2016-05-30 NOTE — Progress Notes (Signed)
Patient discharged from unit at 1310.  Discharge instructions reviewed with patient, Rx for Oxy-IR 5 mg given.  Patient and family verbalized understanding.  Patient discharged via wheelchair with no signs or symptoms of distress.

## 2016-05-30 NOTE — Discharge Instructions (Signed)
Follow with Primary MD Lilian Coma, MD in 7 days   Get CBC, CMP,   by Primary MD next visit.    Activity: As tolerated with Full fall precautions use walker/cane & assistance as needed   Disposition Home    Diet: Heart Healthy , low fat, with feeding assistance and aspiration precautions.    On your next visit with your primary care physician please Get Medicines reviewed and adjusted.   Please request your Prim.MD to go over all Hospital Tests and Procedure/Radiological results at the follow up, please get all Hospital records sent to your Prim MD by signing hospital release before you go home.   If you experience worsening of your admission symptoms, develop shortness of breath, life threatening emergency, suicidal or homicidal thoughts you must seek medical attention immediately by calling 911 or calling your MD immediately  if symptoms less severe.  You Must read complete instructions/literature along with all the possible adverse reactions/side effects for all the Medicines you take and that have been prescribed to you. Take any new Medicines after you have completely understood and accpet all the possible adverse reactions/side effects.   Do not drive, operating heavy machinery, perform activities at heights, swimming or participation in water activities or provide baby sitting services if your were admitted for syncope or siezures until you have seen by Primary MD or a Neurologist and advised to do so again.  Do not drive when taking Pain medications.    Do not take more than prescribed Pain, Sleep and Anxiety Medications  Special Instructions: If you have smoked or chewed Tobacco  in the last 2 yrs please stop smoking, stop any regular Alcohol  and or any Recreational drug use.  Wear Seat belts while driving.   Please note  You were cared for by a hospitalist during your hospital stay. If you have any questions about your discharge medications or the care you  received while you were in the hospital after you are discharged, you can call the unit and asked to speak with the hospitalist on call if the hospitalist that took care of you is not available. Once you are discharged, your primary care physician will handle any further medical issues. Please note that NO REFILLS for any discharge medications will be authorized once you are discharged, as it is imperative that you return to your primary care physician (or establish a relationship with a primary care physician if you do not have one) for your aftercare needs so that they can reassess your need for medications and monitor your lab values.

## 2016-05-30 NOTE — Progress Notes (Signed)
Patient ambulating in room.  No complaints of pain or discomfort noted.  Son and daughter are visiting.

## 2016-05-30 NOTE — Telephone Encounter (Signed)
Oncology Nurse Navigator Documentation  Oncology Nurse Navigator Flowsheets 05/30/2016  Navigator Location [facility]  Referral date to RadOnc/MedOnc 05/27/2016  Navigator Encounter Type Introductory phone call  Abnormal Finding Date 05/22/2016  Confirmed Diagnosis Date 05/26/2016  Spoke with patient and provided new patient appointment for 06/04/16 at 12:00 with Dr. Benay Spice. Informed of location of [facility], valet service, and registration process. Reminded to bring insurance cards and a current medication list, including supplements. Patient verbalizes understanding. HIM notified.

## 2016-05-30 NOTE — Discharge Summary (Addendum)
[name], is a 76 y.o. female  DOB [birth year]  MRN [id number].  Admission date:  05/22/2016  Admitting Physician  Lily Kocher, MD  Discharge Date:  05/30/2016   Primary MD  Lilian Coma, MD  Recommendations for primary care physician for things to follow:  - Please check CBC, BMP, LFT during next visit. -  to follow with oncology, she will be called with an appointment - Follow with GI Dr. 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 Diabetes mellitus without complication ([place])   . Hypertension   . 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 CYSTECTOMY    . ERCP N/A 05/28/2016   Procedure: ENDOSCOPIC RETROGRADE CHOLANGIOPANCREATOGRAPHY (ERCP);  Surgeon: Arta Silence, MD;  Location: [facility];  Service: Endoscopy;  Laterality: N/A;  . TUBAL LIGATION         History of present illness and  Hospital Course:     Kindly see H&P for history of present illness and admission details, please review complete Labs,  Consult reports and Test reports for all details in brief  HPI  from the history and physical done on the day of admission 05/22/2016  HPI: [name] is a 76 y.o. woman with a history of HTN, DM, and CKD 3 who presents to the ED with multiple complaints, including abnormal vaginal bleeding, dysuria (with possible hematuria), and rectal pain with bowel movements (though she denies constipation or the need to strain).  She has not seen any blood in her stool.  She denies associated fever.  No abdominal pain.  No nausea or vomiting, but she reports decreased appetite and 5 lb weight loss since Christmas.  No headache.  No chest pain or shortness of breath.  She has had increased weakness.  ED Course: U/A concerning for infection and the patient received oral cipro.  She has a mild leukocytosis and slightly decreased sodium, but renal function appears to be at baseline.  ED attending was actually anticipating discharge until her hepatic function panel came back showing markedly abnormal LFTs.  ALT 213, AST 177, Alk Phos 623, Total Bilirubin 7.6.  RUQ ultrasound ordered which showed large gallbladder stone with sludge and mild wall thickening.  No biliary dilatation.  No sonographic Murphy's sign.  GI and General Surgery consults have been called from the ED.  IV flagyl and IV fluids added.  Hospitalist asked to admit.  Hospital Course   76 y.o.woman with a history of HTN, DM, and CKD 3 who presents to the ED with multiple complaints, Was noticed to have jaundice, very likely obstructive jaundice secondary to cholangiocarcinoma, that is post omental biopsy by IR 1/15, ERCP with stent placement on 1/17.  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 She has had weight loss and loss of appetite; otherwise, asymptomatic. - on  levofloxacin and Flagyl which has been stopped 1/18 - negative hepatitis panel - MRCP and abdominal MRI:  Borderline intrahepatic duct dilatation, narrowing of common bile duct in the porta hepatis at the site of soft tissue signal lesion likely infiltrative adenopathy. Gallstones with nonspecific gallbladder fundal wall thickening suspicious for liver lesion -per general surgery, no role for surgery at this time. 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 Alvy Bimler- Dr. Learta Codding will arrange for outpatient follow-up next week - ERCP with biliary stent placment by Dr Paulita Fujita 1/17, tolerating soft diet, and to  be discharged with an outpatient follow-up with Dr. Paulita Fujita, discussed with Dr.Ganem, no need to trend bili before discharge at its expected take sometime to 4*trending down  Essential hypertension -Continue home medication  Diabetes mellitus -Continue home regimen  Chronic kidney disease, stage III -Patient noted to have GFR in stage III 2015, mild dip in 2017, currently stage III -At baseline  Hypokalemia -Repleted  Possible UTI -Urine culture showed multiple species -treated with  Levaquin  Abnormal vaginal bleeding -Possibly related to the above -Outpatient follow up  Leukocytosis  -Possibly reactive vs ?UTI     Discharge Condition:  Stable   Follow UP  Follow-up Information    Lilian Coma, MD. Schedule an appointment as soon as possible for a visit in 1 week(s).   Specialty:  Family Medicine Contact information: [address] [place] [address] [phone]        Landry Dyke, MD. Schedule an appointment as soon as possible for a visit in 2 day(s).   Specialty:  Gastroenterology Contact information: [address] [place] [address] [phone]        [name], MD Follow up.   Specialty:  Oncology Why:  will be contacted by his office for an appointment next  week Contact information: [place] [address] [phone]             Discharge Instructions  and  Discharge Medications     Discharge Instructions    Discharge instructions    Complete by:  As directed    Follow with Primary MD Lilian Coma, MD in 7 days   Get CBC, CMP,   by Primary MD next visit.    Activity: As tolerated with Full fall precautions use walker/cane & assistance as needed   Disposition Home    Diet: Heart Healthy , low fat, with feeding assistance and aspiration precautions.    On your next visit with your primary care physician please Get Medicines reviewed and adjusted.   Please request your Prim.MD to go over all Hospital Tests and Procedure/Radiological results at the follow up, please get all Hospital records sent to your Prim MD by signing hospital release before  you go home.   If you experience worsening of your admission symptoms, develop shortness of breath, life threatening emergency, suicidal or homicidal thoughts you must seek medical attention immediately by calling 911 or calling your MD immediately  if symptoms less severe.  You Must read complete instructions/literature along with all the possible adverse reactions/side effects for all the Medicines you take and that have been prescribed to you. Take any new Medicines after you have completely understood and accpet all the possible adverse reactions/side effects.   Do not drive, operating heavy machinery, perform activities at heights, swimming or participation in water activities or provide baby sitting services if your were admitted for syncope or siezures until you have seen by Primary MD or a Neurologist and advised to do so again.  Do not drive when taking Pain medications.    Do not take more than prescribed Pain, Sleep and Anxiety Medications  Special Instructions: If you have smoked or chewed Tobacco  in the last 2 yrs please stop smoking, stop any  regular Alcohol  and or any Recreational drug use.  Wear Seat belts while driving.   Please note  You were cared for by a hospitalist during your hospital stay. If you have any questions about your discharge medications or the care you received while you were in the hospital after you are discharged, you can call the unit and asked to speak with the hospitalist on call if the hospitalist that took care of you is not available. Once you are discharged, your primary care physician will handle any further medical issues. Please note that NO REFILLS for any discharge medications will be authorized once you are discharged, as it is imperative that you return to your primary care physician (or establish a relationship with a primary care physician if you do not have one) for your aftercare needs so that they can reassess your need for medications and monitor your lab values.   Increase activity slowly    Complete by:  As directed      Allergies as of 05/30/2016      Reactions   Rosiglitazone Maleate Other (See Comments)   couldn't sleep   Metformin Diarrhea   Penicillins Hives, Rash   Has patient had a PCN reaction causing immediate rash, facial/tongue/throat swelling, SOB or lightheadedness with hypotension: yes Has patient had a PCN reaction causing severe rash involving mucus membranes or skin necrosis: no Has patient had a PCN reaction that required hospitalization:no Has patient had a PCN reaction occurring within the last 10 years: no If all of the above answers are "NO", then may proceed with Cephalosporin use.   Pioglitazone Other (See Comments)   Joint pain       Medication List    STOP taking these medications   FISH OIL PO   furosemide 40 MG tablet Commonly known as:  LASIX     TAKE these medications   aspirin EC 81 MG tablet Take 81 mg by mouth daily.   cholecalciferol 1000 units tablet Commonly known as:  VITAMIN D Take 1,000 Units by mouth daily.   feeding  supplement (ENSURE ENLIVE) Liqd Take 237 mLs by mouth 2 (two) times daily between meals.   HUMALOG MIX 50/50 KWIKPEN (50-50) 100 UNIT/ML Kwikpen Generic drug:  Insulin Lispro Prot & Lispro Inject 10-40 Units into the skin 2 (two) times daily. Inject 40 units under the skin before breakfast and 10 units under the skin before evening meal.   multivitamin with minerals  Tabs tablet Take 1 tablet by mouth daily.   Olmesartan-Amlodipine-HCTZ 40-10-12.5 MG Tabs Take 1 tablet by mouth daily.   oxyCODONE 5 MG immediate release tablet Commonly known as:  Oxy IR/ROXICODONE Take 0.5 tablets (2.5 mg total) by mouth every 6 (six) hours as needed for severe pain.         Diet and Activity recommendation: See Discharge Instructions above   Consults obtained - Gastroenterology, [facility] Interventional radiology Oncology     Major procedures and Radiology Reports - PLEASE review detailed and final reports for all details, in brief -   CT core biopsy omentum by IR 1/15 ERCP with biliary stent placment by Dr Paulita Fujita 1/17  Ct Abdomen Pelvis Wo Contrast  Result Date: 05/22/2016 CLINICAL DATA:  Vaginal bleeding for 1 week EXAM: CT ABDOMEN AND PELVIS WITHOUT CONTRAST TECHNIQUE: Multidetector CT imaging of the abdomen and pelvis was performed following the standard protocol without IV contrast. COMPARISON:  12/09/2012 FINDINGS: Lower chest: Lung bases demonstrate no acute consolidation or pleural effusion. Normal heart size. Hepatobiliary: No focal hepatic abnormality. No biliary dilatation. Calcified gallstone. Possible wall thickening. Ill-defined soft tissue expansion of of the porta hepatis appears different from comparison CT. Pancreas: No peripancreatic inflammation. No dilatation of pancreas duct. Spleen: Normal in size without focal abnormality. Adrenals/Urinary Tract: Adrenal glands are within normal limits. Punctate calcification upper pole right kidney. Punctate nonobstructing  stone or mild vascular calcification mid left kidney. Bladder unremarkable. Stomach/Bowel: Stomach nonenlarged. Small hiatal hernia. No dilated small bowel. No colon wall thickening. Vascular/Lymphatic: Atherosclerotic calcifications. No aneurysm. Enlarged porta hepatis lymph nodes up to 11 mm. Mild retroperitoneal adenopathy unchanged. Reproductive: Calcified uterine fibroid. Mild right adnexal enlargement compared to prior Other: No free air or free fluid. Interim finding of soft tissue infiltration of the anterior pelvic fat. Multiple soft tissue nodules within the lower omentum and within the mesenteric fat. Musculoskeletal: No acute or suspicious bone lesions. IMPRESSION: 1. Interim finding of infiltrative soft tissue density within the anterior lower abdominal/upper pelvic fat with multiple omental, mesenteric and intraperitoneal nodules visualized. Findings would be concerning for metastatic disease. 2. Calcified uterine fibroids. Mild soft tissue enlargement of the right adnexa. Could consider correlation with pelvic ultrasound. 3. Ill-defined possible soft tissue expansion at the porta hepatis. Follow-up CT examination with contrast would be helpful for further evaluation. Calcified gallstone with possible wall thickening, could correlate with ultrasound. Mild enlarged lymph nodes at the porta hepatis. 4. Nonobstructing stone right kidney. Punctate stone or mild vascular calcification mid left kidney. Electronically Signed   By: Donavan Foil M.D.   On: 05/22/2016 22:45   Ct Chest Wo Contrast  Result Date: 05/27/2016 CLINICAL DATA:  Omental metastatic disease, possible metastatic disease within chest EXAM: CT CHEST WITHOUT CONTRAST TECHNIQUE: Multidetector CT imaging of the chest was performed following the standard protocol without IV contrast. COMPARISON:  Images of the lung bases 05/22/2014 FINDINGS: Cardiovascular: Cardiac size within normal limits. No pericardial effusion. Atherosclerotic  calcifications of thoracic aorta. Mediastinum/Nodes: Precarinal lymph node Measures 1cm short-axis. No definite hilar adenopathy is noted on this unenhanced scan. Central airways are patent. Lungs/Pleura: Images of the lung parenchyma shows no pulmonary edema. No focal consolidation. No pulmonary nodules are noted. There is small right pleural effusion and right base posterior atelectasis. No bronchiectasis or emphysematous changes. No fibrotic changes are noted. Upper Abdomen: Again noted irregular gallbladder fundus and some thickening of the wall of the gallbladder. Neoplastic process cannot be excluded. Probable lymph node anterior to porta  hepatis measures 1.2 cm pathologic suspicious for adenopathy. No adrenal gland mass is noted. Musculoskeletal: No destructive bony lesions are noted. Sagittal images of the spine shows degenerative changes thoracic spine. There is left axillary lymph node measures 1.2 cm short-axis. Second left axillary lymph node measures 1.2 cm short-axis. IMPRESSION: 1. There is borderline precarinal lymph node measures 1 cm short-axis. No hilar adenopathy is noted. Mild enlarged left axillary lymph nodes. Pathologic adenopathy cannot be excluded. Clinical correlation is necessary. 2. No infiltrate or pulmonary edema. No pulmonary nodules are noted. 3. Small right pleural effusion with right base posterior atelectasis. 4. No destructive bony lesions are noted. 5. Again noted irregular thickening of gallbladder wall and irregular appearance of gallbladder fundus. Neoplastic process cannot be excluded. Clinical correlation is necessary. 6. No adrenal gland mass. Again noted probable pathologic adenopathy in porta hepatic region. Electronically Signed   By: Lahoma Crocker M.D.   On: 05/27/2016 16:13   US Abdomen Complete  Result Date: 05/22/2016 CLINICAL DATA:  Elevated LFTs, UTI EXAM: ABDOMEN ULTRASOUND COMPLETE COMPARISON:  12/03/2015 FINDINGS: Gallbladder: Large shadowing stone in the  gallbladder measuring 2.6 cm. Mild wall thickening at 4.3 mm. Negative sonographic Murphy's. Sludge is also present within the gallbladder. Common bile duct: Diameter: Normal at 3.5 mm. Liver: No focal lesion identified. Within normal limits in parenchymal echogenicity. IVC: No abnormality visualized. Pancreas: Visualized portion unremarkable. Spleen: Size and appearance within normal limits. Right Kidney: Length: 8.9 cm. Echogenicity within normal limits. No mass or hydronephrosis visualized. Lower pole obscured by bowel gas Left Kidney: Length: 10 cm. Echogenicity within normal limits. No mass or hydronephrosis visualized. Abdominal aorta: No aneurysm visualized. Other findings: None. IMPRESSION: 1. Hepatic echogenicity is within normal limits. No biliary dilatation 2. Large shadowing stone within the gallbladder with sludge also present. Mild wall thickening of the gallbladder is non specific. Negative sonographic Murphy's. Electronically Signed   By: Donavan Foil M.D.   On: 05/22/2016 20:08   Mr Abdomen Mrcp Wo Contrast  Result Date: 05/23/2016 CLINICAL DATA:  Elevated liver function tests. CT demonstrating gallstones and possible soft tissue expansion in the porta hepatis. EXAM: MRI ABDOMEN WITHOUT CONTRAST  (INCLUDING MRCP) TECHNIQUE: Multiplanar multisequence MR imaging of the abdomen was performed. Heavily T2-weighted images of the biliary and pancreatic ducts were obtained, and three-dimensional MRCP images were rendered by post processing. COMPARISON:  CT of 1 day prior.  Ultrasound of 05/23/2015 FINDINGS: Lower chest: Normal heart size without pericardial or pleural effusion. Tiny hiatal hernia. Hepatobiliary: 2.4 cm gallstone. Suggestion of gallbladder wall thickening at the fundus, including on image 31/ series 10. Contiguous 2.5 cm pericholecystic hepatic lesion straddling the right and left lobes. This is moderately T2 hyperintense, irregular, with restricted diffusion. Example image 21/series 3.  Intrahepatic ducts either borderline prominent, including on image 21/ series 3. There is a suggestion of T2 hyperintensity along the portal triads, including on image 19/ series 3. This is nonspecific and could represent edema. The common duct becomes narrowed in the region of the porta hepatis. An ill-defined soft tissue signal measures 4.6 x 2.5 cm on image 23/ series 3. The more distal common duct is normal in caliber. This area of narrowed common duct is readily apparent on image 9/ series 400, image 65/series 4. Pancreas:  Normal, without mass or ductal dilatation. Spleen:  Normal in size, without focal abnormality. Adrenals/Urinary Tract: Normal adrenal glands. Normal kidneys, without hydronephrosis. Stomach/Bowel: Normal remainder of the stomach. Normal caliber of large and small bowel loops. Vascular/Lymphatic:  Normal caliber of the aorta and branch vessels. No other abdominal adenopathy. Other: No ascites. Incompletely imaged omental thickening within the pelvis on image 30/series 10. Musculoskeletal: No acute osseous abnormality. IMPRESSION: 1. Borderline intrahepatic duct dilatation with narrowing of the common duct in the porta hepatis at the site of a soft tissue signal lesion which is likely infiltrative adenopathy. The more distal common duct is normal in caliber. 2. Gallstone with nonspecific gallbladder fundal wall thickening. Adjacent suspicious liver lesion. Considerations include metastatic disease from the patient's presumed primary versus direct extension of gallbladder carcinoma into the adjacent liver. Consider sampling of the liver lesion for staging. 3. Suggestion of extension of edema along the portal triads, nonspecific. Contrast-enhanced hepatic protocol CT may be informative. 4. Incompletely imaged omental thickening/nodularity as on CT. This is highly suspicious for peritoneal metastasis. Potential primaries include ovarian, gallbladder, or an otherwise unknown primary. Electronically  Signed   By: Abigail Miyamoto M.D.   On: 05/23/2016 21:58   Mr 3d Recon At Scanner  Result Date: 05/23/2016 CLINICAL DATA:  Elevated liver function tests. CT demonstrating gallstones and possible soft tissue expansion in the porta hepatis. EXAM: MRI ABDOMEN WITHOUT CONTRAST  (INCLUDING MRCP) TECHNIQUE: Multiplanar multisequence MR imaging of the abdomen was performed. Heavily T2-weighted images of the biliary and pancreatic ducts were obtained, and three-dimensional MRCP images were rendered by post processing. COMPARISON:  CT of 1 day prior.  Ultrasound of 05/23/2015 FINDINGS: Lower chest: Normal heart size without pericardial or pleural effusion. Tiny hiatal hernia. Hepatobiliary: 2.4 cm gallstone. Suggestion of gallbladder wall thickening at the fundus, including on image 31/ series 10. Contiguous 2.5 cm pericholecystic hepatic lesion straddling the right and left lobes. This is moderately T2 hyperintense, irregular, with restricted diffusion. Example image 21/series 3. Intrahepatic ducts either borderline prominent, including on image 21/ series 3. There is a suggestion of T2 hyperintensity along the portal triads, including on image 19/ series 3. This is nonspecific and could represent edema. The common duct becomes narrowed in the region of the porta hepatis. An ill-defined soft tissue signal measures 4.6 x 2.5 cm on image 23/ series 3. The more distal common duct is normal in caliber. This area of narrowed common duct is readily apparent on image 9/ series 400, image 65/series 4. Pancreas:  Normal, without mass or ductal dilatation. Spleen:  Normal in size, without focal abnormality. Adrenals/Urinary Tract: Normal adrenal glands. Normal kidneys, without hydronephrosis. Stomach/Bowel: Normal remainder of the stomach. Normal caliber of large and small bowel loops. Vascular/Lymphatic: Normal caliber of the aorta and branch vessels. No other abdominal adenopathy. Other: No ascites. Incompletely imaged omental  thickening within the pelvis on image 30/series 10. Musculoskeletal: No acute osseous abnormality. IMPRESSION: 1. Borderline intrahepatic duct dilatation with narrowing of the common duct in the porta hepatis at the site of a soft tissue signal lesion which is likely infiltrative adenopathy. The more distal common duct is normal in caliber. 2. Gallstone with nonspecific gallbladder fundal wall thickening. Adjacent suspicious liver lesion. Considerations include metastatic disease from the patient's presumed primary versus direct extension of gallbladder carcinoma into the adjacent liver. Consider sampling of the liver lesion for staging. 3. Suggestion of extension of edema along the portal triads, nonspecific. Contrast-enhanced hepatic protocol CT may be informative. 4. Incompletely imaged omental thickening/nodularity as on CT. This is highly suspicious for peritoneal metastasis. Potential primaries include ovarian, gallbladder, or an otherwise unknown primary. Electronically Signed   By: Abigail Miyamoto M.D.   On: 05/23/2016 21:58  Ct Biopsy  Result Date: 05/27/2016 CLINICAL DATA:  omental thickening/nodularity as on CT. Thisis highly suspicious for peritoneal metastasis. Potential primariesinclude ovarian, gallbladder, or an otherwise unknown primary EXAM: CT GUIDED CORE BIOPSY OF OMENTUM ANESTHESIA/SEDATION: Intravenous Fentanyl and Versed were administered as conscious sedation during continuous monitoring of the patient's level of consciousness and physiological / cardiorespiratory status by the radiology RN, with a total moderate sedation time of 10 minutes. PROCEDURE: The procedure risks, benefits, and alternatives were explained to the patient. Questions regarding the procedure were encouraged and answered. The patient understands and consents to the procedure. Select axial scans through the lower abdomen were obtained. The omental disease was localized and an appropriate skin entry site determined and  marked. The operative field was prepped with chlorhexidinein a sterile fashion, and a sterile drape was applied covering the operative field. A sterile gown and sterile gloves were used for the procedure. Local anesthesia was provided with 1% Lidocaine. Under CT fluoroscopic guidance, a 17 gauge trocar needle was advanced to the margin of the lesion. Once needle tip position was confirmed, coaxial 18-gauge core biopsy samples were obtained, submitted in formalin to surgical pathology. The guide needle was removed. Postprocedure scans show no hemorrhage or other apparent complication. The patient tolerated the procedure well. COMPLICATIONS: None immediate FINDINGS: Nodular omental thickening in the left lower abdomen/pelvis. Representative core biopsy samples obtained as above. IMPRESSION: 1. Technically successful core biopsy of omental thickening. Electronically Signed   By: Lucrezia Europe M.D.   On: 05/27/2016 09:13   Dg Ercp With Sphincterotomy  Result Date: 05/28/2016 CLINICAL DATA:  Obstructive jaundice EXAM: ERCP TECHNIQUE: Multiple spot images obtained with the fluoroscopic device and submitted for interpretation post-procedure. FLUOROSCOPY TIME:  Fluoroscopy Time:  6 minutes and 25 seconds Radiation Exposure Index (if provided by the fluoroscopic device): 93.84 mGy Number of Acquired Spot Images: 9 COMPARISON:  None. FINDINGS: There is narrowing of the common hepatic duct in the porta hepatis. A biliary stent has been placed across the high common hepatic duct stricture hand across the ampulla. IMPRESSION: Successful biliary stent placement for upper common hepatic duct stricture. These images were submitted for radiologic interpretation only. Please see the procedural report for the amount of contrast and the fluoroscopy time utilized. Electronically Signed   By: Marybelle Killings M.D.   On: 05/28/2016 12:15    Micro Results     Recent Results (from the past 240 hour(s))  Urine culture     Status:  Abnormal   Collection Time: 05/22/16  5:00 PM  Result Value Ref Range Status   Specimen Description URINE, RANDOM  Final   Special Requests NONE  Final   Culture MULTIPLE SPECIES PRESENT, SUGGEST RECOLLECTION (A)  Final   Report Status 05/24/2016 FINAL  Final       Today   Subjective:   [name] today has no headache,no chest or abdominal pain, be tolerating soft diet, no nausea no vomiting no abdominal pain .   Objective:   Blood pressure (!) 144/78, pulse (!) 103, temperature 99.1 F (37.3 C), temperature source Oral, resp. rate 18, height 5' 7"  (1.702 m), weight 78.5 kg (173 lb), SpO2 97 %.   Intake/Output Summary (Last 24 hours) at 05/30/16 1256 Last data filed at 05/30/16 1008  Gross per 24 hour  Intake              600 ml  Output              800 ml  Net             -200 ml    Exam Awake Alert, Oriented x 3, Icteric sclera Supple Neck,No JVD, Symmetrical Chest wall movement, Good air movement bilaterally, CTAB RRR,No Gallops,Rubs or new Murmurs, No Parasternal Heave +ve B.Sounds, Abd Soft, No rebound -guarding or rigidity. No Cyanosis, Clubbing or edema, No new Rash or bruise  Data Review   CBC w Diff:  Lab Results  Component Value Date   WBC 12.7 (H) 05/28/2016   HGB 9.8 (L) 05/28/2016   HCT 28.8 (L) 05/28/2016   PLT 353 05/28/2016   LYMPHOPCT 43 08/17/2015   MONOPCT 8 08/17/2015   EOSPCT 2 08/17/2015   BASOPCT 0 08/17/2015    CMP:  Lab Results  Component Value Date   NA 139 05/29/2016   K 3.8 05/29/2016   CL 108 05/29/2016   CO2 22 05/29/2016   BUN 18 05/29/2016   CREATININE 1.13 (H) 05/29/2016   PROT 7.7 05/29/2016   ALBUMIN 2.7 (L) 05/29/2016   BILITOT 8.6 (H) 05/29/2016   ALKPHOS 378 (H) 05/29/2016   AST 101 (H) 05/29/2016   ALT 72 (H) 05/29/2016  .   Total Time in preparing paper work, data evaluation and todays exam - 35 minutes  Miner Koral M.D on 05/30/2016 at 12:56 PM  [facility]   Office   [phone]

## 2016-06-04 ENCOUNTER — Encounter: Payer: Self-pay | Admitting: *Deleted

## 2016-06-04 ENCOUNTER — Telehealth: Payer: Self-pay | Admitting: Oncology

## 2016-06-04 ENCOUNTER — Ambulatory Visit (HOSPITAL_BASED_OUTPATIENT_CLINIC_OR_DEPARTMENT_OTHER): Payer: Medicare Other | Admitting: Oncology

## 2016-06-04 VITALS — BP 155/75 | HR 100 | Temp 98.0°F | Resp 18 | Ht 67.0 in | Wt 165.0 lb

## 2016-06-04 DIAGNOSIS — N939 Abnormal uterine and vaginal bleeding, unspecified: Secondary | ICD-10-CM | POA: Diagnosis not present

## 2016-06-04 DIAGNOSIS — R17 Unspecified jaundice: Secondary | ICD-10-CM

## 2016-06-04 DIAGNOSIS — D649 Anemia, unspecified: Secondary | ICD-10-CM

## 2016-06-04 DIAGNOSIS — C801 Malignant (primary) neoplasm, unspecified: Secondary | ICD-10-CM | POA: Diagnosis not present

## 2016-06-04 DIAGNOSIS — K831 Obstruction of bile duct: Secondary | ICD-10-CM | POA: Diagnosis not present

## 2016-06-04 DIAGNOSIS — R630 Anorexia: Secondary | ICD-10-CM

## 2016-06-04 DIAGNOSIS — R634 Abnormal weight loss: Secondary | ICD-10-CM

## 2016-06-04 MED ORDER — FERROUS SULFATE 325 (65 FE) MG PO TBEC: 325.0000 mg | DELAYED_RELEASE_TABLET | Freq: Two times a day (BID) | ORAL | 3 refills | Status: AC

## 2016-06-04 MED ORDER — OXYCODONE HCL 5 MG PO TABS: 2.5000 mg | ORAL_TABLET | Freq: Four times a day (QID) | ORAL | 0 refills | Status: DC | PRN

## 2016-06-04 NOTE — Patient Instructions (Signed)
Care Plan Summary- 06/04/2016 Name: [name]         DOB: [birth year] Your Medical Team:  Medical Oncologist:  Dr. 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 Recommendations may change depending on the results of further tests or exams. 1) Ferrous sulfate (oral iron-OTC) 325 mg twice daily 2) MiraLax daily or add Senna-S or Colace twice daily to keep bowels soft and moving 3) Call if pain not controlled or condition worsens-DO NOT drive when on pain medication  Next Steps: 1) Return in 2 weeks to see Dr. Benay Spice with labs 2)   3)      Questions? Evelyn Elks, RN, BSN at [phone]. [name] is your Oncology Nurse Navigator and is available to assist you while you're receiving your medical care at [facility]

## 2016-06-04 NOTE — Progress Notes (Signed)
Oncology Nurse Navigator Documentation  Oncology Nurse Navigator Flowsheets 06/04/2016  Navigator Location [facility]  Referral date to RadOnc/MedOnc -  Navigator Encounter Type Initial MedOnc  Abnormal Finding Date -  Confirmed Diagnosis Date -  Patient Visit Type MedOnc;Initial  Treatment Phase Pre-Tx/Tx Discussion  Barriers/Navigation Needs Education  Education Newly Diagnosed Cancer Education;Pain/ Symptom Management  Interventions Education--pain and constipation management  Education Method Verbal;Written  Support Groups/Services GI Support Group;Other--[facility]  Acuity Level 1  Time Spent with Patient 30  Met with patient, husband Eduard Clos, and daughter Mardene Celeste during new patient visit. Explained the role of the GI Nurse Navigator and provided New Patient Packet with information on: 1. Gallbladder/Bile duct cancer-- 2. Support groups 3. Advanced Directives 4. Fall Safety Plan and discussed fall prevention and that she is not to drive when taking pain medication Answered questions, reviewed current treatment plan using TEACH back and provided emotional support. Provided copy of current treatment plan. She will return in 2 weeks for labs. Is considering chemotherapy, but would like to get stronger first. Provided patient with ROI to complete and daughter completed form and patient signed it (her husband's name was crossed out).   Merceda Elks, RN, BSN GI Oncology [place]

## 2016-06-04 NOTE — Telephone Encounter (Signed)
Patient's daughter called and said that they would take the 2nd opinion at [facility].  Please go ahead and schedule for the first available Friday.

## 2016-06-04 NOTE — Progress Notes (Signed)
[place] Patient Consult   Referring Fisher: [name]   [name] 76 y.o.  March 26, 1941    Reason for Referral: Metastatic adenocarcinoma   HPI: [name] reports a 3 week history of anorexia and light colored stools.  She also reported vaginal bleeding and dysuria.A CT on 1/11 enlarged porta hepatis nodes, infiltration of soft tissue at low abdomen/pelvic fat, omental and mesenteric implants, soft tissue enlargement at right adnexa, and possible gallbladder wall thickening. An MRI/MRCP on 1/12 revealed narrowing of CBD in the portahepatis, suspicious liver lesion adjacent to GB, GB wall thickening.  [name] performed an ERCP on 05/28/16.  There is a tight stricture of the upper CBD, changes of PBC were noted in the hepatic ducts. A CBD stent was placed. 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  She was discharged 05/30/16.  She reports feeling better since discharge from the hospital. She takes oxycodone infrequently for relief of back pain.    Past Medical History:  Diagnosis Date  . Diabetes mellitus without complication ([place])   . Hypertension   . 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  G6P6   . H/o microscopic hematuria  Past Surgical History:  Procedure Laterality Date  .     Marland Kitchen ERCP N/A 05/28/2016   Procedure: ENDOSCOPIC RETROGRADE CHOLANGIOPANCREATOGRAPHY (ERCP);  Surgeon: [name];  Location: [facility];  Service: Endoscopy;  Laterality: N/A;  . TUBAL LIGATION      Medications: Reviewed  Allergies:  Allergies  Allergen Reactions  . Rosiglitazone Maleate Other (See Comments)    couldn't sleep  . Metformin Diarrhea  . Penicillins Hives and Rash    Has patient had a PCN reaction causing immediate rash, facial/tongue/throat swelling, SOB or lightheadedness with hypotension: yes Has patient had a  PCN reaction causing severe rash involving mucus membranes or skin necrosis: no Has patient had a PCN reaction that required hospitalization:no Has patient had a PCN reaction occurring within the last 10 years: no If all of the above answers are "NO", then may proceed with Cephalosporin use.  . 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 No transfusion history, no risk for HIV or hepatitis    ROS:   Positives include:anorexia with 12lb wt. Loss, light colored stool prior to stent placement-improved, pruritis  A complete ROS was otherwise negative.  Physical Exam:  Blood pressure (!) 155/75, pulse 100, temperature 98 F (36.7 C), temperature source Oral, resp. rate 18, height 5\' 7"  (1.702 m), weight 165 lb (74.8 kg), SpO2 99 %.  HEENT: edentulous, oropharynx without mass, scleral icterus Lungs: clear bilaterally Cardiac: rrr Abdomen: no hsm, no mass, nontender  Vascular: no leg edema Lymph nodes: no cervical, supraclavicular, axillary , or inguinal nodes Neurologic: alert and oriented, the motor exam appears intact in the upper and lower extremities Skin: no rash, jaundice Musculoskeletal: no spine tenderness   LAB:  CBC  Lab Results  Component Value Date   WBC 12.7 (H) 05/28/2016   HGB 9.8 (L) 05/28/2016   HCT 28.8 (L) 05/28/2016   MCV 75.2 (L) 05/28/2016   PLT 353 05/28/2016   NEUTROABS 4.5 08/17/2015  CMP      Component Value Date/Time   NA 139 05/29/2016 0830   K 3.8 05/29/2016 0830   CL 108 05/29/2016 0830   CO2 22 05/29/2016 0830   GLUCOSE 182 (H) 05/29/2016 0830   BUN 18 05/29/2016 0830   CREATININE 1.13 (H) 05/29/2016 0830   CALCIUM 9.0 05/29/2016 0830   PROT 7.7 05/29/2016 0830   ALBUMIN 2.7 (L) 05/29/2016 0830   AST 101 (H)  05/29/2016 0830   ALT 72 (H) 05/29/2016 0830   ALKPHOS 378 (H) 05/29/2016 0830   BILITOT 8.6 (H) 05/29/2016 0830   GFRNONAA 46 (L) 05/29/2016 0830   GFRAA 54 (L) 05/29/2016 0830    05/25/16: ca19-9    5111, cea 14.5   Imaging:  As per HPI, CT abd and MRI reviewed with family   Assessment/Plan:   1. Metastatic Adenocarcinoma  CT/MRI abdomen with omental/peritoneal nodules and thickening, portal adenopathy, single liver lesion, gallbladder wall thickening  ERCP with CBD obstruction  2. 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   Microcytic anemia  4.   Diabetes  5.  Anorexia/wt. loss   Disposition:   [name] has been diagnosed with metastatic adenocarcinoma. The clinical presentation, pathology, and imaging features are most consistent with a primary gallbladder carcinoma or cholangiocarcinoma. However the microcytic anemia and report of "vaginal "bleeding bring other primary tumor sites into the differential diagnosis.  She understands no therapy will be curative. We discussed supportive care versus a trial of systemic chemotherapy. She remains jaundiced after placement of the bile duct stent 05/28/2016. She will not be a candidate for chemotherapy until the bilirubin improves. [name] will return for an office visit, a repeat chemistry panel, and further discussion in 2 weeks.  I offered her a second opinion. She and her family will think about this.  We will increase her about recent screening for colorectal cancer and a GYN evaluation.  Approximately 60 minutes were spent with the patient today. The majority of the time was used for counseling and coordination of care.    Evelyn Coder, Fisher  06/04/2016, 2:18 PM

## 2016-06-05 ENCOUNTER — Telehealth: Payer: Self-pay | Admitting: Oncology

## 2016-06-05 ENCOUNTER — Encounter: Payer: Self-pay | Admitting: *Deleted

## 2016-06-05 ENCOUNTER — Telehealth: Payer: Self-pay | Admitting: *Deleted

## 2016-06-05 DIAGNOSIS — E46 Unspecified protein-calorie malnutrition: Secondary | ICD-10-CM | POA: Diagnosis not present

## 2016-06-05 DIAGNOSIS — Z79899 Other long term (current) drug therapy: Secondary | ICD-10-CM | POA: Diagnosis not present

## 2016-06-05 DIAGNOSIS — D649 Anemia, unspecified: Secondary | ICD-10-CM | POA: Diagnosis not present

## 2016-06-05 DIAGNOSIS — C221 Intrahepatic bile duct carcinoma: Secondary | ICD-10-CM

## 2016-06-05 DIAGNOSIS — K831 Obstruction of bile duct: Secondary | ICD-10-CM | POA: Diagnosis not present

## 2016-06-05 DIAGNOSIS — C799 Secondary malignant neoplasm of unspecified site: Secondary | ICD-10-CM | POA: Diagnosis not present

## 2016-06-05 NOTE — Telephone Encounter (Signed)
Called pt, she denies any bleeding or dark stools. Reports last colonoscopy was ~ 5-6 years ago. First one was done by Dr. Henrene Pastor ~10 or more years ago.  Called [facility] where pt said she had most recent colonoscopy done- they have no record of procedure, stated it would've been an outside report. Called [facility]. Velora Heckler will need to request an off-site chart. [facility] records dept unable to locate report on pt.

## 2016-06-05 NOTE — Progress Notes (Signed)
Oncology Nurse Navigator Documentation  Oncology Nurse Navigator Flowsheets 06/05/2016  Navigator Location [facility]  Referral date to RadOnc/MedOnc -  Navigator Encounter Type Letter/Fax/Email  Abnormal Finding Date -  Confirmed Diagnosis Date -  Patient Visit Type -  Treatment Phase -  Barriers/Navigation Needs Coordination of Care  Education -  Interventions Coordination of Care--ordered referral to [facility] per family request and notified Kim in HIM  Coordination of Care Other--referral to [facility] (Dr. Pia Mau, [place], [place]) and requests 1st available Friday  Education Method -  Support Groups/Services -  Acuity Level 2  Time Spent with Patient 15

## 2016-06-05 NOTE — Telephone Encounter (Signed)
Called [facility] to schedule appt  for second opinion.  [facility] needs to verify pt insurance. The care management team will call  HIM tomorrow if insurance is excepted.

## 2016-06-10 DIAGNOSIS — E46 Unspecified protein-calorie malnutrition: Secondary | ICD-10-CM | POA: Diagnosis not present

## 2016-06-10 DIAGNOSIS — C799 Secondary malignant neoplasm of unspecified site: Secondary | ICD-10-CM | POA: Diagnosis not present

## 2016-06-10 DIAGNOSIS — D649 Anemia, unspecified: Secondary | ICD-10-CM | POA: Diagnosis not present

## 2016-06-10 DIAGNOSIS — R531 Weakness: Secondary | ICD-10-CM | POA: Diagnosis not present

## 2016-06-12 DIAGNOSIS — D649 Anemia, unspecified: Secondary | ICD-10-CM | POA: Diagnosis not present

## 2016-06-12 DIAGNOSIS — R531 Weakness: Secondary | ICD-10-CM | POA: Diagnosis not present

## 2016-06-12 DIAGNOSIS — C799 Secondary malignant neoplasm of unspecified site: Secondary | ICD-10-CM | POA: Diagnosis not present

## 2016-06-12 DIAGNOSIS — E46 Unspecified protein-calorie malnutrition: Secondary | ICD-10-CM | POA: Diagnosis not present

## 2016-06-16 ENCOUNTER — Telehealth: Payer: Self-pay | Admitting: Oncology

## 2016-06-16 ENCOUNTER — Telehealth: Payer: Self-pay | Admitting: *Deleted

## 2016-06-16 NOTE — Telephone Encounter (Signed)
Call from pt to follow up on referral to [facility]. Noted in chart they are out of network for pt's insurance. Will review with MD for second opinion referral at another facility.  Pt will check with her insurance company to find out which local comprehensive cancer facilities are in network.

## 2016-06-16 NOTE — Telephone Encounter (Signed)
Spoke with Horris Latino @ [facility].  Pt's insurance is out of network. [facility] left vm with pt to return call.  Out of pocket office visit would be [phone].

## 2016-06-17 ENCOUNTER — Emergency Department (HOSPITAL_COMMUNITY): Payer: Medicare Other

## 2016-06-17 ENCOUNTER — Inpatient Hospital Stay (HOSPITAL_COMMUNITY)
Admission: EM | Admit: 2016-06-17 | Discharge: 2016-06-21 | DRG: 871 | Disposition: A | Discharge status: Home / Self Care | Payer: Medicare Other | Attending: Internal Medicine | Admitting: Internal Medicine

## 2016-06-17 DIAGNOSIS — R739 Hyperglycemia, unspecified: Secondary | ICD-10-CM

## 2016-06-17 DIAGNOSIS — IMO0002 Reserved for concepts with insufficient information to code with codable children: Secondary | ICD-10-CM | POA: Diagnosis present

## 2016-06-17 DIAGNOSIS — K750 Abscess of liver: Secondary | ICD-10-CM | POA: Diagnosis present

## 2016-06-17 DIAGNOSIS — Z87891 Personal history of nicotine dependence: Secondary | ICD-10-CM | POA: Diagnosis not present

## 2016-06-17 DIAGNOSIS — K5900 Constipation, unspecified: Secondary | ICD-10-CM | POA: Diagnosis present

## 2016-06-17 DIAGNOSIS — K8020 Calculus of gallbladder without cholecystitis without obstruction: Secondary | ICD-10-CM | POA: Diagnosis not present

## 2016-06-17 DIAGNOSIS — Z86010 Personal history of colonic polyps: Secondary | ICD-10-CM | POA: Diagnosis not present

## 2016-06-17 DIAGNOSIS — N179 Acute kidney failure, unspecified: Secondary | ICD-10-CM | POA: Diagnosis not present

## 2016-06-17 DIAGNOSIS — Z880 Allergy status to penicillin: Secondary | ICD-10-CM

## 2016-06-17 DIAGNOSIS — E876 Hypokalemia: Secondary | ICD-10-CM | POA: Diagnosis present

## 2016-06-17 DIAGNOSIS — E785 Hyperlipidemia, unspecified: Secondary | ICD-10-CM | POA: Diagnosis not present

## 2016-06-17 DIAGNOSIS — Z888 Allergy status to other drugs, medicaments and biological substances status: Secondary | ICD-10-CM | POA: Diagnosis not present

## 2016-06-17 DIAGNOSIS — K831 Obstruction of bile duct: Secondary | ICD-10-CM

## 2016-06-17 DIAGNOSIS — I6789 Other cerebrovascular disease: Secondary | ICD-10-CM | POA: Diagnosis not present

## 2016-06-17 DIAGNOSIS — D509 Iron deficiency anemia, unspecified: Secondary | ICD-10-CM | POA: Diagnosis present

## 2016-06-17 DIAGNOSIS — E1122 Type 2 diabetes mellitus with diabetic chronic kidney disease: Secondary | ICD-10-CM | POA: Diagnosis present

## 2016-06-17 DIAGNOSIS — C799 Secondary malignant neoplasm of unspecified site: Secondary | ICD-10-CM | POA: Diagnosis not present

## 2016-06-17 DIAGNOSIS — C801 Malignant (primary) neoplasm, unspecified: Secondary | ICD-10-CM | POA: Diagnosis present

## 2016-06-17 DIAGNOSIS — C221 Intrahepatic bile duct carcinoma: Secondary | ICD-10-CM | POA: Diagnosis present

## 2016-06-17 DIAGNOSIS — K8021 Calculus of gallbladder without cholecystitis with obstruction: Secondary | ICD-10-CM | POA: Diagnosis present

## 2016-06-17 DIAGNOSIS — Z833 Family history of diabetes mellitus: Secondary | ICD-10-CM

## 2016-06-17 DIAGNOSIS — R0602 Shortness of breath: Secondary | ICD-10-CM | POA: Diagnosis not present

## 2016-06-17 DIAGNOSIS — E1165 Type 2 diabetes mellitus with hyperglycemia: Secondary | ICD-10-CM | POA: Diagnosis not present

## 2016-06-17 DIAGNOSIS — A419 Sepsis, unspecified organism: Secondary | ICD-10-CM | POA: Diagnosis not present

## 2016-06-17 DIAGNOSIS — N183 Chronic kidney disease, stage 3 (moderate): Secondary | ICD-10-CM | POA: Diagnosis not present

## 2016-06-17 DIAGNOSIS — I129 Hypertensive chronic kidney disease with stage 1 through stage 4 chronic kidney disease, or unspecified chronic kidney disease: Secondary | ICD-10-CM | POA: Diagnosis present

## 2016-06-17 DIAGNOSIS — R061 Stridor: Secondary | ICD-10-CM | POA: Diagnosis not present

## 2016-06-17 DIAGNOSIS — Z794 Long term (current) use of insulin: Secondary | ICD-10-CM | POA: Diagnosis not present

## 2016-06-17 DIAGNOSIS — R404 Transient alteration of awareness: Secondary | ICD-10-CM | POA: Diagnosis not present

## 2016-06-17 DIAGNOSIS — D649 Anemia, unspecified: Secondary | ICD-10-CM | POA: Diagnosis not present

## 2016-06-17 DIAGNOSIS — R531 Weakness: Secondary | ICD-10-CM | POA: Diagnosis not present

## 2016-06-17 DIAGNOSIS — I482 Chronic atrial fibrillation: Secondary | ICD-10-CM | POA: Diagnosis not present

## 2016-06-17 DIAGNOSIS — E46 Unspecified protein-calorie malnutrition: Secondary | ICD-10-CM | POA: Diagnosis not present

## 2016-06-17 DIAGNOSIS — K8080 Other cholelithiasis without obstruction: Secondary | ICD-10-CM

## 2016-06-17 LAB — CBC WITH DIFFERENTIAL/PLATELET
Basophils Absolute: 0 10*3/uL (ref 0.0–0.1)
Basophils Relative: 0 %
EOS ABS: 0.1 10*3/uL (ref 0.0–0.7)
Eosinophils Relative: 1 %
HEMATOCRIT: 31.4 % — AB (ref 36.0–46.0)
HEMOGLOBIN: 10.9 g/dL — AB (ref 12.0–15.0)
Lymphocytes Relative: 13 %
Lymphs Abs: 2.1 10*3/uL (ref 0.7–4.0)
MCH: 26.6 pg (ref 26.0–34.0)
MCHC: 34.7 g/dL (ref 30.0–36.0)
MCV: 76.6 fL — ABNORMAL LOW (ref 78.0–100.0)
MONO ABS: 1.7 10*3/uL — AB (ref 0.1–1.0)
Monocytes Relative: 10 %
NEUTROS ABS: 12.9 10*3/uL — AB (ref 1.7–7.7)
NEUTROS PCT: 77 %
Platelets: 296 10*3/uL (ref 150–400)
RBC: 4.1 MIL/uL (ref 3.87–5.11)
RDW: 19.3 % — AB (ref 11.5–15.5)
WBC: 16.8 10*3/uL — ABNORMAL HIGH (ref 4.0–10.5)

## 2016-06-17 LAB — COMPREHENSIVE METABOLIC PANEL
ALK PHOS: 623 U/L — AB (ref 38–126)
ALT: 152 U/L — ABNORMAL HIGH (ref 14–54)
ANION GAP: 15 (ref 5–15)
AST: 179 U/L — ABNORMAL HIGH (ref 15–41)
Albumin: 3.1 g/dL — ABNORMAL LOW (ref 3.5–5.0)
BILIRUBIN TOTAL: 11 mg/dL — AB (ref 0.3–1.2)
BUN: 30 mg/dL — ABNORMAL HIGH (ref 6–20)
CALCIUM: 9.3 mg/dL (ref 8.9–10.3)
CO2: 25 mmol/L (ref 22–32)
Chloride: 90 mmol/L — ABNORMAL LOW (ref 101–111)
Creatinine, Ser: 1.48 mg/dL — ABNORMAL HIGH (ref 0.44–1.00)
GFR calc non Af Amer: 33 mL/min — ABNORMAL LOW (ref >60)
GFR, EST AFRICAN AMERICAN: 39 mL/min — AB (ref >60)
Glucose, Bld: 432 mg/dL — ABNORMAL HIGH (ref 65–99)
Potassium: 3.5 mmol/L (ref 3.5–5.1)
Sodium: 130 mmol/L — ABNORMAL LOW (ref 135–145)
TOTAL PROTEIN: 8.3 g/dL — AB (ref 6.5–8.1)

## 2016-06-17 LAB — PROTIME-INR
INR: 1.03
PROTHROMBIN TIME: 13.6 s (ref 11.4–15.2)

## 2016-06-17 LAB — I-STAT CG4 LACTIC ACID, ED: LACTIC ACID, VENOUS: 2.72 mmol/L — AB (ref 0.5–1.9)

## 2016-06-17 LAB — CBG MONITORING, ED: GLUCOSE-CAPILLARY: 400 mg/dL — AB (ref 65–99)

## 2016-06-17 MED ORDER — ALBUTEROL SULFATE (2.5 MG/3ML) 0.083% IN NEBU
5.0000 mg | INHALATION_SOLUTION | Freq: Once | RESPIRATORY_TRACT | Status: AC
Start: 2016-06-17 — End: 2016-06-17
  Administered 2016-06-17: 5 mg via RESPIRATORY_TRACT
  Filled 2016-06-17: qty 6

## 2016-06-17 NOTE — ED Provider Notes (Signed)
[place] DEPT Provider Note   CSN: [id number] Arrival date & time: 06/17/16  2131  By signing my name below, I, Oleh Genin, attest that this documentation has been prepared under the direction and in the presence of Ripley Fraise, MD. Electronically Signed: Oleh Genin, Scribe. 06/18/16. 12:09 AM.   History   Chief Complaint Chief Complaint - short of breath  HPI [name] is a 76 y.o. female with history of diabetes, HTN, CKD, and a recent admission for obstructive jaundice secondary to metastatic cholangiocarcinoma requiring stent placement in 05/2016.    The history is provided by the patient. No language interpreter was used.  Shortness of Breath  This is a new problem. The problem occurs intermittently.The current episode started 3 to 5 hours ago. The problem has been resolved (Patient is currently stating that "she feels fine"). Pertinent negatives include no fever, no headaches, no cough, no chest pain, no vomiting and no abdominal pain. Associated symptoms comments: No vomiting, headache, chest pain, cough, abdominal pain, or diarrhea. . It is unknown what precipitated the problem. She has tried nothing for the symptoms.   The family members at interview are concerned that the patient was "jerking" today in the setting of mildly increased labored breathing. The patient was fully alert and responsive during these episodes. They also are concerned that the patient was hyperglycemic today. At interview, the patient continues to deny any particular complaints.    Past Medical History:  Diagnosis Date  . Diabetes mellitus without complication ([place])   . Hypertension   . 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 Liver mass 05/24/2016  . 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 Gallbladder mass, probable cancer 05/24/2016  . 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  Hyperbilirubinemia 05/22/2016  . Weight loss 05/22/2016  . Loss of appetite 05/22/2016  . Diabetes ([place]) 05/22/2016  . CKD (chronic kidney disease) stage 3, GFR 30-59 ml/min 05/22/2016  . Abnormal vaginal bleeding 05/22/2016  . UTI (urinary tract infection) 05/22/2016  . DYSPEPSIA 10/29/2006  . COUGH 09/28/2006  . URI 09/18/2006  . COLONIC POLYPS 07/23/2006  . Uncontrolled diabetes mellitus ([place]) 07/23/2006  . HYPERLIPIDEMIA 04/09/2006  . Essential hypertension 04/09/2006  . CONSTIPATION 04/09/2006  . OVERACTIVE BLADDER 04/09/2006  . OSTEOARTHRITIS 04/09/2006  . OSTEOPOROSIS 04/09/2006    Past Surgical History:  Procedure Laterality Date  . CYSTECTOMY    . ERCP N/A 05/28/2016   Procedure: ENDOSCOPIC RETROGRADE CHOLANGIOPANCREATOGRAPHY (ERCP);  Surgeon: Arta Silence, MD;  Location: [facility];  Service: Endoscopy;  Laterality: N/A;  . TUBAL LIGATION      OB History    No data available       Home Medications    Prior to Admission medications   Medication Sig Start Date End Date Taking? Authorizing Provider  aspirin EC 81 MG tablet Take 81 mg by mouth daily.   Yes Historical Provider, MD  atorvastatin (LIPITOR) 20 MG tablet Take 20 mg by mouth daily. 05/07/16  Yes Historical Provider, MD  cholecalciferol (VITAMIN D) 1000 UNITS tablet Take 1,000 Units by mouth daily.    Yes Historical Provider, MD  ferrous sulfate 325 (65 FE) MG EC tablet Take 1 tablet (325 mg total) by mouth 2 (two) times daily with a meal. 06/04/16  Yes Ladell Pier, MD  HUMALOG MIX 50/50 KWIKPEN (50-50) 100 UNIT/ML Kwikpen Inject 10-40 Units into the skin 2 (two) times daily. Inject 40 units  under the skin before breakfast and 10 units under the skin before evening meal. 05/13/16  Yes Historical Provider, MD  Multiple Vitamin (MULTIVITAMIN WITH MINERALS) TABS tablet Take 1 tablet by mouth daily.   Yes Historical Provider, MD  Olmesartan-Amlodipine-HCTZ 40-10-12.5 MG TABS Take 1 tablet by mouth daily.  04/14/16  Yes Historical Provider, MD  oxyCODONE (OXY IR/ROXICODONE) 5 MG immediate release tablet Take 0.5-1 tablets (2.5-5 mg total) by mouth every 6 (six) hours as needed for severe pain. 06/04/16  Yes Ladell Pier, MD  feeding supplement, ENSURE ENLIVE, (ENSURE ENLIVE) LIQD Take 237 mLs by mouth 2 (two) times daily between meals. 05/30/16   Albertine Patricia, MD    Family History Family History  Problem Relation Age of Onset  . Cancer Mother   . Diabetes Father     Social History Social History  Substance Use Topics  . Smoking status: Former Research scientist (life sciences)  . Smokeless tobacco: Never Used  . Alcohol use No     Allergies   Rosiglitazone maleate; Metformin; Penicillins; and Pioglitazone   Review of Systems Review of Systems  Constitutional: Negative for fever.  Respiratory: Positive for shortness of breath. Negative for cough.   Cardiovascular: Negative for chest pain.  Gastrointestinal: Negative for abdominal pain and vomiting.  Neurological: Negative for headaches.  All other systems reviewed and are negative.    Physical Exam Updated Vital Signs BP 144/70 (BP Location: Right Arm)   Pulse 106   Temp 100.7 F (38.2 C) (Oral)   Resp 14   SpO2 96%   Physical Exam CONSTITUTIONAL: Elderly and uncomfortable appearing.  HEAD: Normocephalic/atraumatic EYES: EOMI/PERRL, scleral icterus noted.  ENMT: Mucous membranes moist NECK: supple no meningeal signs SPINE/BACK:entire spine nontender CV: S1/S2 noted, no murmurs/rubs/gallops noted LUNGS: Lungs are clear to auscultation bilaterally, no apparent distress ABDOMEN: soft, nontender, no rebound or guarding GU:no cva tenderness NEURO: Pt is awake/alert/appropriate, moves all extremitiesx4.  No facial droop.   EXTREMITIES: pulses normal/equal, full ROM SKIN: warm, color normal PSYCH: no abnormalities of mood noted, alert and oriented to situation  ED Treatments / Results  DIAGNOSTIC STUDIES: Oxygen Saturation is 96  percent on room air which is normal by my interpretation.    COORDINATION OF CARE: 12:00 AM Discussed treatment plan with pt at bedside and pt agreed to plan.  Labs (all labs ordered are listed, but only abnormal results are displayed) Labs Reviewed  COMPREHENSIVE METABOLIC PANEL - Abnormal; Notable for the following:       Result Value   Sodium 130 (*)    Chloride 90 (*)    Glucose, Bld 432 (*)    BUN 30 (*)    Creatinine, Ser 1.48 (*)    Total Protein 8.3 (*)    Albumin 3.1 (*)    AST 179 (*)    ALT 152 (*)    Alkaline Phosphatase 623 (*)    Total Bilirubin 11.0 (*)    GFR calc non Af Amer 33 (*)    GFR calc Af Amer 39 (*)    All other components within normal limits  CBC WITH DIFFERENTIAL/PLATELET - Abnormal; Notable for the following:    WBC 16.8 (*)    Hemoglobin 10.9 (*)    HCT 31.4 (*)    MCV 76.6 (*)    RDW 19.3 (*)    Neutro Abs 12.9 (*)    Monocytes Absolute 1.7 (*)    All other components within normal limits  CBG MONITORING, ED - Abnormal; Notable for  the following:    Glucose-Capillary 400 (*)    All other components within normal limits  I-STAT CG4 LACTIC ACID, ED - Abnormal; Notable for the following:    Lactic Acid, Venous 2.72 (*)    All other components within normal limits  URINE CULTURE  CULTURE, BLOOD (ROUTINE X 2)  CULTURE, BLOOD (ROUTINE X 2)  PROTIME-INR  LIPASE, BLOOD  URINALYSIS, ROUTINE W REFLEX MICROSCOPIC  INFLUENZA PANEL BY PCR (TYPE A & B)    EKG  EKG Interpretation  Date/Time:  Tuesday June 17 2016 21:41:16 EST Ventricular Rate:  103 PR Interval:    QRS Duration: 87 QT Interval:  345 QTC Calculation: 452 R Axis:   30 Text Interpretation:  Sinus tachycardia No significant change since last tracing Confirmed by Christy Gentles  MD, [place] ([id number]) on 06/17/2016 11:45:17 PM       Radiology Dg Chest 2 View  Result Date: 06/17/2016 CLINICAL DATA:  Gallbladder and bile duct cancer, rapid breathing EXAM: CHEST  2 VIEW COMPARISON:   03/15/2013 FINDINGS: The heart size and mediastinal contours are within normal limits. Both lungs are clear. Degenerative changes of the spine. Atherosclerosis of the aorta. Partially visualized stent in the right upper quadrant. IMPRESSION: No active cardiopulmonary disease. Electronically Signed   By: Donavan Foil M.D.   On: 06/17/2016 22:49   Ct Abdomen Pelvis W Contrast  Result Date: 06/18/2016 CLINICAL DATA:  Fever. Recent admission for jaundice secondary to metastatic cholangiocarcinoma. Recent ERCP and stent placement. Elevated lactic acid. EXAM: CT ABDOMEN AND PELVIS WITH CONTRAST TECHNIQUE: Multidetector CT imaging of the abdomen and pelvis was performed using the standard protocol following bolus administration of intravenous contrast. CONTRAST:  49mL ISOVUE-300 IOPAMIDOL (ISOVUE-300) INJECTION 61% COMPARISON:  MRI abdomen 05/23/2016. CT abdomen and pelvis 05/22/2016. FINDINGS: Lower chest: Mild dependent changes in the lung bases. Coronary artery calcifications. Residual fluid in the esophagus without distention may indicate reflux or dysmotility. Hepatobiliary: Again demonstrated, there is a somewhat poorly defined hypodense 3 x 4.1 cm diameter mass in the medial segment left lobe of the liver adjacent to the gallbladder fossa. The lesion appears larger than on prior study and increasing low-attenuation since previous study suggest possible central necrosis or could indicate developing abscess/infection given the history of fever. Mild periportal edema. Portal veins appear patent. Mild bile duct dilatation. Biliary wall stent in place. Large stone in the gallbladder. Mild gallbladder wall thickening. Pancreas: Unremarkable. No pancreatic ductal dilatation or surrounding inflammatory changes. Spleen: Normal in size without focal abnormality. Adrenals/Urinary Tract: No adrenal gland nodules. Punctate stones in the upper pole both kidneys. No hydronephrosis or hydroureter. Bladder wall is not thickened  and no bladder filling defects are demonstrated. Stomach/Bowel: Scattered diverticula in the colon. Stool throughout the colon. Colonic wall appears mildly thickened throughout which may indicate colitis. No pericolonic infiltration. Stomach and small bowel are not abnormally distended. Vascular/Lymphatic: Aortic atherosclerosis. No enlarged abdominal or pelvic lymph nodes. Reproductive: Calcified uterine fibroids. No abnormal adnexal masses. Small amount of free fluid in the pelvis is nonspecific but likely reactive. Small malignant ascites not excluded. Other: There is diffuse omental stranding and nodularity with scattered nodule are deposits throughout the peritoneal likely metastatic. No free air in the abdomen. Musculoskeletal: Degenerative changes in the spine and hips. No destructive bone lesions. IMPRESSION: Low-attenuation mass in segment 4 of the liver demonstrates increased size and decreased attenuation since previous study, suggesting central necrosis or developing abscess. Mild bile duct dilatation with biliary stent in place. Cholelithiasis with gallbladder  wall thickening. Colon wall appears mildly thickened throughout, possibly indicating colitis. No evidence of bowel obstruction. Diffuse omental caking and peritoneal nodularity likely representing peritoneal metastasis. Aortic atherosclerosis. Punctate size nonobstructing stones in the kidneys. Electronically Signed   By: Lucienne Capers M.D.   On: 06/18/2016 02:11    Procedures Procedures (including critical care time)  Medications Ordered in ED Medications  sodium chloride 0.9 % bolus 1,000 mL (1,000 mLs Intravenous New Bag/Given 06/18/16 0130)    And  sodium chloride 0.9 % bolus 1,000 mL (1,000 mLs Intravenous New Bag/Given 06/18/16 0230)    And  sodium chloride 0.9 % bolus 250 mL (not administered)  metroNIDAZOLE (FLAGYL) IVPB 500 mg (not administered)  albuterol (PROVENTIL) (2.5 MG/3ML) 0.083% nebulizer solution 5 mg (5 mg  Nebulization Given 06/17/16 2205)  levofloxacin (LEVAQUIN) IVPB 750 mg (750 mg Intravenous New Bag/Given 06/18/16 0130)  ibuprofen (ADVIL,MOTRIN) tablet 600 mg (600 mg Oral Given 06/18/16 0134)  iopamidol (ISOVUE-300) 61 % injection 100 mL (70 mLs Intravenous Contrast Given 06/18/16 0142)     Initial Impression / Assessment and Plan / ED Course  I have reviewed the triage vital signs and the nursing notes.  Pertinent labs & imaging results that were available during my care of the patient were reviewed by me and considered in my medical decision making (see chart for details).     12:22 AM Pt with h/o gallbladder mass/CA, recent CBD stent placement here with fever and worsening LFTs Will need Ct imaging Fluids/IV antibiotics ordered 3:08 AM D/w dr Amedeo Plenty with [facility] We discussed imaging and recent procedures  Continue IV fluids/antibiotics Admit to medical service and will see later this AM  3:38 AM D/w Dr Alcario Drought with [facility] Plan to admit to medical service for IV fluids/antibiotics Pt awake/alert, no distress at this time Final Clinical Impressions(s) / ED Diagnoses   Final diagnoses:  Sepsis, due to unspecified organism [facility])  Biliary calculus of other site without obstruction  AKI (acute kidney injury) ([place])  Hyperglycemia    New Prescriptions New Prescriptions   No medications on file  I personally performed the services described in this documentation, which was scribed in my presence. The recorded information has been reviewed and is accurate.        Ripley Fraise, MD 06/18/16 [phone]

## 2016-06-17 NOTE — ED Notes (Signed)
Asked pt if she could give a urine sample, but said she is not able to.

## 2016-06-17 NOTE — ED Triage Notes (Signed)
Per EMS, pts daughter called due to pt having shortness of breath, diaphoresis, and weakness. Pt also has altered mental status Pt recently diagnosed with bladder cancer. CBG 457 per EMS tachy at 108, resp 18, temp 99.8

## 2016-06-17 NOTE — ED Notes (Signed)
Bed: WA17 Expected date:  Expected time:  Means of arrival:  Comments: 76 yr old, weak, cbg 23

## 2016-06-18 ENCOUNTER — Emergency Department (HOSPITAL_COMMUNITY): Payer: Medicare Other

## 2016-06-18 ENCOUNTER — Encounter (HOSPITAL_COMMUNITY): Payer: Self-pay

## 2016-06-18 DIAGNOSIS — C799 Secondary malignant neoplasm of unspecified site: Secondary | ICD-10-CM | POA: Diagnosis not present

## 2016-06-18 DIAGNOSIS — A419 Sepsis, unspecified organism: Secondary | ICD-10-CM | POA: Diagnosis not present

## 2016-06-18 DIAGNOSIS — K5900 Constipation, unspecified: Secondary | ICD-10-CM | POA: Diagnosis present

## 2016-06-18 DIAGNOSIS — Z87891 Personal history of nicotine dependence: Secondary | ICD-10-CM | POA: Diagnosis not present

## 2016-06-18 DIAGNOSIS — Z880 Allergy status to penicillin: Secondary | ICD-10-CM | POA: Diagnosis not present

## 2016-06-18 DIAGNOSIS — I129 Hypertensive chronic kidney disease with stage 1 through stage 4 chronic kidney disease, or unspecified chronic kidney disease: Secondary | ICD-10-CM | POA: Diagnosis present

## 2016-06-18 DIAGNOSIS — E876 Hypokalemia: Secondary | ICD-10-CM | POA: Diagnosis not present

## 2016-06-18 DIAGNOSIS — E1165 Type 2 diabetes mellitus with hyperglycemia: Secondary | ICD-10-CM | POA: Diagnosis not present

## 2016-06-18 DIAGNOSIS — N183 Chronic kidney disease, stage 3 (moderate): Secondary | ICD-10-CM | POA: Diagnosis present

## 2016-06-18 DIAGNOSIS — K831 Obstruction of bile duct: Secondary | ICD-10-CM | POA: Diagnosis not present

## 2016-06-18 DIAGNOSIS — K8021 Calculus of gallbladder without cholecystitis with obstruction: Secondary | ICD-10-CM | POA: Diagnosis present

## 2016-06-18 DIAGNOSIS — Z833 Family history of diabetes mellitus: Secondary | ICD-10-CM | POA: Diagnosis not present

## 2016-06-18 DIAGNOSIS — K750 Abscess of liver: Secondary | ICD-10-CM | POA: Diagnosis present

## 2016-06-18 DIAGNOSIS — D649 Anemia, unspecified: Secondary | ICD-10-CM | POA: Diagnosis not present

## 2016-06-18 DIAGNOSIS — Z888 Allergy status to other drugs, medicaments and biological substances status: Secondary | ICD-10-CM | POA: Diagnosis not present

## 2016-06-18 DIAGNOSIS — C801 Malignant (primary) neoplasm, unspecified: Secondary | ICD-10-CM | POA: Diagnosis not present

## 2016-06-18 DIAGNOSIS — Z86010 Personal history of colonic polyps: Secondary | ICD-10-CM | POA: Diagnosis not present

## 2016-06-18 DIAGNOSIS — D509 Iron deficiency anemia, unspecified: Secondary | ICD-10-CM | POA: Diagnosis present

## 2016-06-18 DIAGNOSIS — E785 Hyperlipidemia, unspecified: Secondary | ICD-10-CM | POA: Diagnosis present

## 2016-06-18 DIAGNOSIS — C221 Intrahepatic bile duct carcinoma: Secondary | ICD-10-CM | POA: Diagnosis not present

## 2016-06-18 DIAGNOSIS — E1122 Type 2 diabetes mellitus with diabetic chronic kidney disease: Secondary | ICD-10-CM | POA: Diagnosis present

## 2016-06-18 DIAGNOSIS — N179 Acute kidney failure, unspecified: Secondary | ICD-10-CM | POA: Diagnosis not present

## 2016-06-18 DIAGNOSIS — R0602 Shortness of breath: Secondary | ICD-10-CM | POA: Diagnosis present

## 2016-06-18 DIAGNOSIS — Z794 Long term (current) use of insulin: Secondary | ICD-10-CM | POA: Diagnosis not present

## 2016-06-18 LAB — GLUCOSE, CAPILLARY
GLUCOSE-CAPILLARY: 383 mg/dL — AB (ref 65–99)
GLUCOSE-CAPILLARY: 279 mg/dL — AB (ref 65–99)
GLUCOSE-CAPILLARY: 203 mg/dL — AB (ref 65–99)
Glucose-Capillary: 322 mg/dL — ABNORMAL HIGH (ref 65–99)
Glucose-Capillary: 357 mg/dL — ABNORMAL HIGH (ref 65–99)

## 2016-06-18 LAB — INFLUENZA PANEL BY PCR (TYPE A & B)
Influenza A By PCR: NEGATIVE
Influenza B By PCR: NEGATIVE

## 2016-06-18 LAB — CBG MONITORING, ED: GLUCOSE-CAPILLARY: 437 mg/dL — AB (ref 65–99)

## 2016-06-18 LAB — LIPASE, BLOOD: Lipase: 18 U/L (ref 11–51)

## 2016-06-18 MED ORDER — IOPAMIDOL (ISOVUE-300) INJECTION 61%
100.0000 mL | Freq: Once | INTRAVENOUS | Status: AC | PRN
  Administered 2016-06-18: 70 mL via INTRAVENOUS

## 2016-06-18 MED ORDER — IRBESARTAN 150 MG PO TABS
300.0000 mg | ORAL_TABLET | Freq: Every day | ORAL | Status: DC
  Administered 2016-06-18 – 2016-06-20 (×3): 300 mg via ORAL
  Filled 2016-06-18 (×3): qty 2

## 2016-06-18 MED ORDER — INSULIN ASPART 100 UNIT/ML ~~LOC~~ SOLN
0.0000 [IU] | SUBCUTANEOUS | Status: DC
  Administered 2016-06-18: 9 [IU] via SUBCUTANEOUS
  Administered 2016-06-18: 5 [IU] via SUBCUTANEOUS
  Administered 2016-06-18 (×2): 9 [IU] via SUBCUTANEOUS
  Administered 2016-06-19: 2 [IU] via SUBCUTANEOUS
  Administered 2016-06-19: 3 [IU] via SUBCUTANEOUS
  Administered 2016-06-19: 2 [IU] via SUBCUTANEOUS

## 2016-06-18 MED ORDER — HYDROCHLOROTHIAZIDE 12.5 MG PO CAPS
12.5000 mg | ORAL_CAPSULE | Freq: Every day | ORAL | Status: DC
  Administered 2016-06-18: 12.5 mg via ORAL
  Filled 2016-06-18: qty 1

## 2016-06-18 MED ORDER — SODIUM CHLORIDE 0.9 % IV BOLUS (SEPSIS)
1000.0000 mL | Freq: Once | INTRAVENOUS | Status: AC
  Administered 2016-06-18: 1000 mL via INTRAVENOUS

## 2016-06-18 MED ORDER — DIPHENHYDRAMINE HCL 50 MG/ML IJ SOLN
12.5000 mg | Freq: Once | INTRAMUSCULAR | Status: AC
  Administered 2016-06-18: 12.5 mg via INTRAVENOUS
  Filled 2016-06-18: qty 1

## 2016-06-18 MED ORDER — AMLODIPINE BESYLATE 10 MG PO TABS
10.0000 mg | ORAL_TABLET | Freq: Every day | ORAL | Status: DC
  Administered 2016-06-18 – 2016-06-21 (×4): 10 mg via ORAL
  Filled 2016-06-18 (×4): qty 1

## 2016-06-18 MED ORDER — SODIUM CHLORIDE 0.9 % IV BOLUS (SEPSIS): 250.0000 mL | Freq: Once | INTRAVENOUS | Status: DC

## 2016-06-18 MED ORDER — ASPIRIN EC 81 MG PO TBEC
81.0000 mg | DELAYED_RELEASE_TABLET | Freq: Every day | ORAL | Status: DC
  Administered 2016-06-18 – 2016-06-21 (×4): 81 mg via ORAL
  Filled 2016-06-18 (×4): qty 1

## 2016-06-18 MED ORDER — LEVOFLOXACIN IN D5W 750 MG/150ML IV SOLN
750.0000 mg | INTRAVENOUS | Status: DC
  Administered 2016-06-19: 750 mg via INTRAVENOUS
  Filled 2016-06-18: qty 150

## 2016-06-18 MED ORDER — OXYCODONE HCL 5 MG PO TABS
2.5000 mg | ORAL_TABLET | Freq: Four times a day (QID) | ORAL | Status: DC | PRN
  Administered 2016-06-18: 2.5 mg via ORAL
  Filled 2016-06-18: qty 1

## 2016-06-18 MED ORDER — METRONIDAZOLE 500 MG PO TABS
500.0000 mg | ORAL_TABLET | Freq: Three times a day (TID) | ORAL | Status: DC
  Administered 2016-06-18 – 2016-06-21 (×9): 500 mg via ORAL
  Filled 2016-06-18 (×9): qty 1

## 2016-06-18 MED ORDER — INSULIN GLARGINE 100 UNIT/ML ~~LOC~~ SOLN
10.0000 [IU] | Freq: Every day | SUBCUTANEOUS | Status: DC
  Administered 2016-06-18: 10 [IU] via SUBCUTANEOUS
  Filled 2016-06-18 (×2): qty 0.1

## 2016-06-18 MED ORDER — LEVOFLOXACIN IN D5W 750 MG/150ML IV SOLN
750.0000 mg | Freq: Once | INTRAVENOUS | Status: AC
  Administered 2016-06-18: 750 mg via INTRAVENOUS
  Filled 2016-06-18: qty 150

## 2016-06-18 MED ORDER — FERROUS SULFATE 325 (65 FE) MG PO TABS
325.0000 mg | ORAL_TABLET | Freq: Two times a day (BID) | ORAL | Status: DC
  Administered 2016-06-18 – 2016-06-21 (×7): 325 mg via ORAL
  Filled 2016-06-18 (×6): qty 1

## 2016-06-18 MED ORDER — ATORVASTATIN CALCIUM 20 MG PO TABS
20.0000 mg | ORAL_TABLET | Freq: Every day | ORAL | Status: DC
  Administered 2016-06-18 – 2016-06-21 (×4): 20 mg via ORAL
  Filled 2016-06-18 (×4): qty 1

## 2016-06-18 MED ORDER — ADULT MULTIVITAMIN W/MINERALS CH
1.0000 | ORAL_TABLET | Freq: Every day | ORAL | Status: DC
  Administered 2016-06-18 – 2016-06-21 (×4): 1 via ORAL
  Filled 2016-06-18 (×4): qty 1

## 2016-06-18 MED ORDER — OLMESARTAN-AMLODIPINE-HCTZ 40-10-12.5 MG PO TABS: 1.0000 | ORAL_TABLET | Freq: Every day | ORAL | Status: DC

## 2016-06-18 MED ORDER — METRONIDAZOLE IN NACL 5-0.79 MG/ML-% IV SOLN
500.0000 mg | Freq: Once | INTRAVENOUS | Status: DC
  Filled 2016-06-18: qty 100

## 2016-06-18 MED ORDER — DIPHENHYDRAMINE HCL 25 MG PO CAPS
25.0000 mg | ORAL_CAPSULE | Freq: Three times a day (TID) | ORAL | Status: DC | PRN
  Administered 2016-06-18 – 2016-06-21 (×3): 25 mg via ORAL
  Filled 2016-06-18 (×3): qty 1

## 2016-06-18 MED ORDER — IBUPROFEN 200 MG PO TABS
600.0000 mg | ORAL_TABLET | Freq: Once | ORAL | Status: AC
  Administered 2016-06-18: 600 mg via ORAL
  Filled 2016-06-18: qty 3

## 2016-06-18 NOTE — Progress Notes (Addendum)
Inpatient Diabetes Program Recommendations  AACE/ADA: New Consensus Statement on Inpatient Glycemic Control (2015)  Target Ranges:  Prepandial:   less than 140 mg/dL      Peak postprandial:   less than 180 mg/dL (1-2 hours)      Critically ill patients:  140 - 180 mg/dL   Results for CAYLIANA, MAGNESS (MRN [id number]) as of 06/18/2016 10:22  Ref. Range 06/17/2016 21:40 06/18/2016 05:31 06/18/2016 10:07  Glucose-Capillary Latest Ref Range: 65 - 99 mg/dL 400 (H) 437 (H) 322 (H)    Home DM Meds: Humalog 50/50 Insulin- 40 units AM with breakfast/ 10 units PM with supper  Current Insulin Orders: Lantus 10 units daily      Novolog Sensitive Correction Scale/ SSI (0-9 units) Q4 hours       MD- Note Lantus and Novolog both started this AM.  CBGs quite elevated on admission.  Note patient takes 50/50 insulin at home.    Based on her home dose of the 50/50 insulin, patient may require higher dose of Lantus (gets total of 25 units longer-acting insulin from both doses of 50/50 insulin)     --Will follow patient during hospitalization--  Wyn Quaker RN, MSN, CDE Diabetes Coordinator Inpatient Glycemic Control Team Team Pager: [phone] (8a-5p)

## 2016-06-18 NOTE — ED Notes (Signed)
Attempted to get urine sample, but pt was sleeping.

## 2016-06-18 NOTE — H&P (Signed)
History and Physical    SUELLEN DUROCHER LKJ:[id number] DOB: [birth year] DOA: 06/17/2016   PCP: Lilian Coma, MD Chief Complaint: short of breath  HPI: [name] is a 76 y.o. 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  The cancer was diagnosed during a hospital stay last month from 1/11 to 1/19 when she was admitted for LFT elevation, loss of appetite, whiteish stools.  During that admission (on 1/17) a biliary stent was placed by Dr. Paulita Fujita in her CBD for obstructive jaundice and brush biopsy was taken of the neoplasm (which would come back later as adenocarcinoma favoring a GI primary).  Since that time she has had alternating brownish stools with white stools but has continued to feel somewhat poor.  Today the patient presents to the ED after an episode of "jerking" with increased labored breathing.  Patient was fully alert and responsive during this episode.  Symptoms have resolved.  Family is also concerned that she is hyperglycemic today.  Patient states she "feels fine".  ED Course: Lab work in the ED is concerning for worsening LFTs (which were supposed to be improving post stent placement).  Bili up to 11 from 7-8 during last admit, ALK now 623 from 378 at time of discharge, AST and ALT also up slightly.  Due to a WBC of 16.8k and Tm of 100.7 in the ED, patient was also treated for sepsis with levaquin and flagyl for presumed abdominal source.  Review of Systems: As per HPI otherwise 10 point review of systems negative.    Past Medical History:  Diagnosis Date  . Diabetes mellitus without complication ([place])   . Hypertension   . 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 CYSTECTOMY    . ERCP N/A 05/28/2016   Procedure: ENDOSCOPIC RETROGRADE CHOLANGIOPANCREATOGRAPHY (ERCP);  Surgeon: Arta Silence, MD;   Location: [facility];  Service: Endoscopy;  Laterality: N/A;  . TUBAL LIGATION       reports that she has quit smoking. She has never used smokeless tobacco. She reports that she does not drink alcohol or use drugs.  Allergies  Allergen Reactions  . Rosiglitazone Maleate Other (See Comments)    couldn't sleep  . Metformin Diarrhea  . Penicillins Hives and Rash    Has patient had a PCN reaction causing immediate rash, facial/tongue/throat swelling, SOB or lightheadedness with hypotension: yes Has patient had a PCN reaction causing severe rash involving mucus membranes or skin necrosis: no Has patient had a PCN reaction that required hospitalization:no Has patient had a PCN reaction occurring within the last 10 years: no If all of the above answers are "NO", then may proceed with Cephalosporin use.  . Pioglitazone Other (See Comments)    Joint pain     Family History  Problem Relation Age of Onset  . Cancer Mother   . Diabetes Father       Prior to Admission medications   Medication Sig Start Date End Date Taking? Authorizing Provider  aspirin EC 81 MG tablet Take 81 mg by mouth daily.   Yes Historical Provider, MD  atorvastatin (LIPITOR) 20 MG tablet Take 20 mg by mouth daily. 05/07/16  Yes Historical Provider, MD  cholecalciferol (VITAMIN D) 1000 UNITS tablet Take 1,000 Units by mouth daily.    Yes Historical Provider, MD  ferrous sulfate 325 (65 FE) MG EC tablet Take 1 tablet (325  mg total) by mouth 2 (two) times daily with a meal. 06/04/16  Yes Ladell Pier, MD  HUMALOG MIX 50/50 KWIKPEN (50-50) 100 UNIT/ML Kwikpen Inject 10-40 Units into the skin 2 (two) times daily. Inject 40 units under the skin before breakfast and 10 units under the skin before evening meal. 05/13/16  Yes Historical Provider, MD  Multiple Vitamin (MULTIVITAMIN WITH MINERALS) TABS tablet Take 1 tablet by mouth daily.   Yes Historical Provider, MD  Olmesartan-Amlodipine-HCTZ 40-10-12.5 MG TABS Take 1 tablet  by mouth daily. 04/14/16  Yes Historical Provider, MD  oxyCODONE (OXY IR/ROXICODONE) 5 MG immediate release tablet Take 0.5-1 tablets (2.5-5 mg total) by mouth every 6 (six) hours as needed for severe pain. 06/04/16  Yes Ladell Pier, MD  feeding supplement, ENSURE ENLIVE, (ENSURE ENLIVE) LIQD Take 237 mLs by mouth 2 (two) times daily between meals. 05/30/16   Albertine Patricia, MD    Physical Exam: Vitals:   06/18/16 0300 06/18/16 0400 06/18/16 0434 06/18/16 0500  BP: (!) 122/50 153/62 157/64 158/69  Pulse: 97 90 92   Resp: _0 Temp:      TempSrc:      SpO2: 97% 96% 96%   Weight:      Height:          Constitutional: NAD, calm, comfortable Eyes: PERRL, conjunctivae Jaundiced ENMT: Mucous membranes are moist. Posterior pharynx clear of any exudate or lesions.Normal dentition.  Neck: normal, supple, no masses, no thyromegaly Respiratory: clear to auscultation bilaterally, no wheezing, no crackles. Normal respiratory effort. No accessory muscle use.  Cardiovascular: Regular rate and rhythm, no murmurs / rubs / gallops. No extremity edema. 2+ pedal pulses. No carotid bruits.  Abdomen: no tenderness, no masses palpated. No hepatosplenomegaly. Bowel sounds positive.  Musculoskeletal: no clubbing / cyanosis. No joint deformity upper and lower extremities. Good ROM, no contractures. Normal muscle tone.  Skin: no rashes, lesions, ulcers. No induration Neurologic: CN 2-12 grossly intact. Sensation intact, DTR normal. Strength 5/5 in all 4.  Psychiatric: Normal judgment and insight. Alert and oriented x 3. Normal mood.    Labs on Admission: I have personally reviewed following labs and imaging studies  CBC:  Recent Labs Lab 06/17/16 2312  WBC 16.8*  NEUTROABS 12.9*  HGB 10.9*  HCT 31.4*  MCV 76.6*  PLT 828   Basic Metabolic Panel:  Recent Labs Lab 06/17/16 2312  NA 130*  K 3.5  CL 90*  CO2 25  GLUCOSE 432*  BUN 30*  CREATININE 1.48*  CALCIUM 9.3    GFR: Estimated Creatinine Clearance: 33.1 mL/min (by C-G formula based on SCr of 1.48 mg/dL (H)). Liver Function Tests:  Recent Labs Lab 06/17/16 2312  AST 179*  ALT 152*  ALKPHOS 623*  BILITOT 11.0*  PROT 8.3*  ALBUMIN 3.1*    Recent Labs Lab 06/18/16 0253  LIPASE 18   No results for input(s): AMMONIA in the last 168 hours. Coagulation Profile:  Recent Labs Lab 06/17/16 2312  INR 1.03   Cardiac Enzymes: No results for input(s): CKTOTAL, CKMB, CKMBINDEX, TROPONINI in the last 168 hours. BNP (last 3 results) No results for input(s): PROBNP in the last 8760 hours. HbA1C: No results for input(s): HGBA1C in the last 72 hours. CBG:  Recent Labs Lab 06/17/16 2140  GLUCAP 400*   Lipid Profile: No results for input(s): CHOL, HDL, LDLCALC, TRIG, CHOLHDL, LDLDIRECT in the last 72 hours. Thyroid Function Tests: No results for input(s): TSH, T4TOTAL, FREET4, T3FREE, THYROIDAB  in the last 72 hours. Anemia Panel: No results for input(s): VITAMINB12, FOLATE, FERRITIN, TIBC, IRON, RETICCTPCT in the last 72 hours. Urine analysis:    Component Value Date/Time   COLORURINE AMBER (A) 05/22/2016 1700   APPEARANCEUR CLOUDY (A) 05/22/2016 1700   LABSPEC 1.012 05/22/2016 1700   PHURINE 5.0 05/22/2016 1700   GLUCOSEU 150 (A) 05/22/2016 1700   HGBUR LARGE (A) 05/22/2016 1700   BILIRUBINUR SMALL (A) 05/22/2016 1700   KETONESUR NEGATIVE 05/22/2016 1700   PROTEINUR 100 (A) 05/22/2016 1700   UROBILINOGEN 0.2 12/03/2013 2155   NITRITE NEGATIVE 05/22/2016 1700   LEUKOCYTESUR LARGE (A) 05/22/2016 1700   Sepsis Labs: _0 (procalcitonin:4,lacticidven:4) )No results found for this or any previous visit (from the past 240 hour(s)).   Radiological Exams on Admission: Dg Chest 2 View  Result Date: 06/17/2016 CLINICAL DATA:  Gallbladder and bile duct cancer, rapid breathing EXAM: CHEST  2 VIEW COMPARISON:  03/15/2013 FINDINGS: The heart size and mediastinal contours are within  normal limits. Both lungs are clear. Degenerative changes of the spine. Atherosclerosis of the aorta. Partially visualized stent in the right upper quadrant. IMPRESSION: No active cardiopulmonary disease. Electronically Signed   By: Donavan Foil M.D.   On: 06/17/2016 22:49   Ct Abdomen Pelvis W Contrast  Result Date: 06/18/2016 CLINICAL DATA:  Fever. Recent admission for jaundice secondary to metastatic cholangiocarcinoma. Recent ERCP and stent placement. Elevated lactic acid. EXAM: CT ABDOMEN AND PELVIS WITH CONTRAST TECHNIQUE: Multidetector CT imaging of the abdomen and pelvis was performed using the standard protocol following bolus administration of intravenous contrast. CONTRAST:  36m ISOVUE-300 IOPAMIDOL (ISOVUE-300) INJECTION 61% COMPARISON:  MRI abdomen 05/23/2016. CT abdomen and pelvis 05/22/2016. FINDINGS: Lower chest: Mild dependent changes in the lung bases. Coronary artery calcifications. Residual fluid in the esophagus without distention may indicate reflux or dysmotility. Hepatobiliary: Again demonstrated, there is a somewhat poorly defined hypodense 3 x 4.1 cm diameter mass in the medial segment left lobe of the liver adjacent to the gallbladder fossa. The lesion appears larger than on prior study and increasing low-attenuation since previous study suggest possible central necrosis or could indicate developing abscess/infection given the history of fever. Mild periportal edema. Portal veins appear patent. Mild bile duct dilatation. Biliary wall stent in place. Large stone in the gallbladder. Mild gallbladder wall thickening. Pancreas: Unremarkable. No pancreatic ductal dilatation or surrounding inflammatory changes. Spleen: Normal in size without focal abnormality. Adrenals/Urinary Tract: No adrenal gland nodules. Punctate stones in the upper pole both kidneys. No hydronephrosis or hydroureter. Bladder wall is not thickened and no bladder filling defects are demonstrated. Stomach/Bowel:  Scattered diverticula in the colon. Stool throughout the colon. Colonic wall appears mildly thickened throughout which may indicate colitis. No pericolonic infiltration. Stomach and small bowel are not abnormally distended. Vascular/Lymphatic: Aortic atherosclerosis. No enlarged abdominal or pelvic lymph nodes. Reproductive: Calcified uterine fibroids. No abnormal adnexal masses. Small amount of free fluid in the pelvis is nonspecific but likely reactive. Small malignant ascites not excluded. Other: There is diffuse omental stranding and nodularity with scattered nodule are deposits throughout the peritoneal likely metastatic. No free air in the abdomen. Musculoskeletal: Degenerative changes in the spine and hips. No destructive bone lesions. IMPRESSION: Low-attenuation mass in segment 4 of the liver demonstrates increased size and decreased attenuation since previous study, suggesting central necrosis or developing abscess. Mild bile duct dilatation with biliary stent in place. Cholelithiasis with gallbladder wall thickening. Colon wall appears mildly thickened throughout, possibly indicating colitis. No evidence of bowel obstruction.  Diffuse omental caking and peritoneal nodularity likely representing peritoneal metastasis. Aortic atherosclerosis. Punctate size nonobstructing stones in the kidneys. Electronically Signed   By: Lucienne Capers M.D.   On: 06/18/2016 02:11    EKG: Independently reviewed.  Assessment/Plan Principal Problem:   Obstructive jaundice due to cancer [facility]) Active Problems:   Uncontrolled diabetes mellitus ([place])   Sepsis ([place])   Metastatic adenocarcinoma ([place])    1. Obstructive Jaundice due to metastatic adenocarcinoma - 1. Per Dr. Amedeo Plenty' instructions 1. IVF 2. ABx 3. He will see in AM 2. Concerning that numbers are worsening, especially as per Dr. Gearldine Shown 1/24 office note: "She will not be a candidate for chemotherapy until the bilirubin improves." 3. Will keep  patient NPO except meds and SCDs only for DVT ppx in case GI wants to take patient for EGD / ERCP.  2. Sepsis - 1. IVF 2. Will keep patient on levaquin and flagyl 3. PCN allergy noted 4. certainly patient doesn't clinically appear as sick as I would expect if her SIRS findings were to be due to ascending cholangitis (patient even states "I didn't expect to have to stay today") 1. UA ordered and pending 3. DM2 - 1. Hold home 50/50 while NPO 2. Lantus 10 units daily 3. Sensitive scale SSI Q4H while NPO   DVT prophylaxis: SCDs Code Status: Full Family Communication: Family at bedside Consults called: Dr. Amedeo Plenty called by EDP Admission status: Admit to inpatient   Etta Quill DO [facility] Pager [phone] from 7PM-7AM  If 7AM-7PM, please contact the day physician for the patient [email or web address] Password [facility]  06/18/2016, 5:31 AM

## 2016-06-18 NOTE — Consult Note (Signed)
[facility] Consult Note  Referring Provider: No ref. provider found Primary Care Physician:  Lilian Coma, MD Primary Gastroenterologist:  Dr.  Laurel Dimmer Complaint: Admitted due to fever and leukocytosis. HPI: [name] is an 76 y.o. black female  recently diagnosed with adenocarcinoma presumably cholangiocarcinoma with tissue confirmation by omental biopsies of multiple implants. She presented with biliary obstruction and ERCP showed a distal stricture with intrahepatic dilatation with some stricturing and beading suggestive of sclerosing cholangitis. A 10 French 6 cm stent was placed. She was discharged on January 19 and has done reasonably well at home and has had one follow-up with visit with Dr. Malachy Mood of oncology. Her daughter noticed her to have involuntary jerking movements and was somewhat disoriented briefly brought her to the emergency room. She was stable there without specific complaints but had a temperature of 100.7 and WBC of 16,000 with elevated liver function test compared to her previous post-ERCP values particularly bilirubin and alkaline phosphatase. CT scan showed stent appeared to be in place with fluid collection near the gallbladder fossa in association with the previously seen left hepatic mass suspicious for possible early abscess. She was admitted and started on IV antibiotics.  Past Medical History:  Diagnosis Date  . Diabetes mellitus without complication ([place])   . Hypertension   . 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 CYSTECTOMY    . ERCP N/A 05/28/2016   Procedure: ENDOSCOPIC RETROGRADE CHOLANGIOPANCREATOGRAPHY (ERCP);  Surgeon: Arta Silence, MD;  Location: [facility];  Service: Endoscopy;  Laterality: N/A;  . TUBAL LIGATION      Medications Prior to Admission  Medication Sig Dispense Refill  . aspirin EC 81 MG tablet Take 81 mg by mouth daily.    Marland Kitchen atorvastatin (LIPITOR) 20 MG  tablet Take 20 mg by mouth daily.  1  . cholecalciferol (VITAMIN D) 1000 UNITS tablet Take 1,000 Units by mouth daily.     . ferrous sulfate 325 (65 FE) MG EC tablet Take 1 tablet (325 mg total) by mouth 2 (two) times daily with a meal.  3  . HUMALOG MIX 50/50 KWIKPEN (50-50) 100 UNIT/ML Kwikpen Inject 10-40 Units into the skin 2 (two) times daily. Inject 40 units under the skin before breakfast and 10 units under the skin before evening meal.  11  . Multiple Vitamin (MULTIVITAMIN WITH MINERALS) TABS tablet Take 1 tablet by mouth daily.    . Olmesartan-Amlodipine-HCTZ 40-10-12.5 MG TABS Take 1 tablet by mouth daily.  3  . oxyCODONE (OXY IR/ROXICODONE) 5 MG immediate release tablet Take 0.5-1 tablets (2.5-5 mg total) by mouth every 6 (six) hours as needed for severe pain. 40 tablet 0  . feeding supplement, ENSURE ENLIVE, (ENSURE ENLIVE) LIQD Take 237 mLs by mouth 2 (two) times daily between meals. 237 mL 12    Allergies:  Allergies  Allergen Reactions  . Rosiglitazone Maleate Other (See Comments)    couldn't sleep  . Metformin Diarrhea  . Penicillins Hives and Rash    Has patient had a PCN reaction causing immediate rash, facial/tongue/throat swelling, SOB or lightheadedness with hypotension: yes Has patient had a PCN reaction causing severe rash involving mucus membranes or skin necrosis: no Has patient had a PCN reaction that required hospitalization:no Has patient had a PCN reaction occurring within the last 10 years: no If all of the above answers are "NO", then may proceed with Cephalosporin use.  . Pioglitazone Other (See Comments)    Joint  pain     Family History  Problem Relation Age of Onset  . Cancer Mother   . Diabetes Father     Social History:  reports that she has quit smoking. She has never used smokeless tobacco. She reports that she does not drink alcohol or use drugs.  Review of Systems: negative except As above  Blood pressure 134/73, pulse 93, temperature 97.8  F (36.6 C), temperature source Other (Comment), resp. rate 13, height _0  (1.702 m), weight 71.9 kg (158 lb 8 oz), SpO2 100 %. Head: Normocephalic, without obvious abnormality, atraumatic Neck: no adenopathy, no carotid bruit, no JVD, supple, symmetrical, trachea midline and thyroid not enlarged, symmetric, no tenderness/mass/nodules Resp: clear to auscultation bilaterally Cardio: regular rate and rhythm, S1, S2 normal, no murmur, click, rub or gallop GI:  abdomen soft nondistended with normoactive bowel sounds no hepatosplenomegaly mass or guarding  Extremities: extremities normal, atraumatic, no cyanosis or edema  Results for orders placed or performed during the hospital encounter of 06/17/16 (from the past 48 hour(s))  CBG monitoring, ED     Status: Abnormal   Collection Time: 06/17/16  9:40 PM  Result Value Ref Range   Glucose-Capillary 400 (H) 65 - 99 mg/dL   Comment 1 Notify RN    Comment 2 Document in Chart   Comprehensive metabolic panel     Status: Abnormal   Collection Time: 06/17/16 11:12 PM  Result Value Ref Range   Sodium 130 (L) 135 - 145 mmol/L   Potassium 3.5 3.5 - 5.1 mmol/L   Chloride 90 (L) 101 - 111 mmol/L   CO2 25 22 - 32 mmol/L   Glucose, Bld 432 (H) 65 - 99 mg/dL   BUN 30 (H) 6 - 20 mg/dL   Creatinine, Ser 1.48 (H) 0.44 - 1.00 mg/dL   Calcium 9.3 8.9 - 10.3 mg/dL   Total Protein 8.3 (H) 6.5 - 8.1 g/dL   Albumin 3.1 (L) 3.5 - 5.0 g/dL   AST 179 (H) 15 - 41 U/L   ALT 152 (H) 14 - 54 U/L   Alkaline Phosphatase 623 (H) 38 - 126 U/L   Total Bilirubin 11.0 (H) 0.3 - 1.2 mg/dL   GFR calc non Af Amer 33 (L) >60 mL/min   GFR calc Af Amer 39 (L) >60 mL/min    Comment: (NOTE) The eGFR has been calculated using the CKD EPI equation. This calculation has not been validated in all clinical situations. eGFR's persistently <60 mL/min signify possible Chronic Kidney Disease.    Anion gap 15 5 - 15  CBC with Differential/Platelet     Status: Abnormal   Collection  Time: 06/17/16 11:12 PM  Result Value Ref Range   WBC 16.8 (H) 4.0 - 10.5 K/uL   RBC 4.10 3.87 - 5.11 MIL/uL   Hemoglobin 10.9 (L) 12.0 - 15.0 g/dL   HCT 31.4 (L) 36.0 - 46.0 %   MCV 76.6 (L) 78.0 - 100.0 fL   MCH 26.6 26.0 - 34.0 pg   MCHC 34.7 30.0 - 36.0 g/dL   RDW 19.3 (H) 11.5 - 15.5 %   Platelets 296 150 - 400 K/uL    Comment: REPEATED TO VERIFY   Neutrophils Relative % 77 %   Neutro Abs 12.9 (H) 1.7 - 7.7 K/uL   Lymphocytes Relative 13 %   Lymphs Abs 2.1 0.7 - 4.0 K/uL   Monocytes Relative 10 %   Monocytes Absolute 1.7 (H) 0.1 - 1.0 K/uL   Eosinophils Relative 1 %  Eosinophils Absolute 0.1 0.0 - 0.7 K/uL   Basophils Relative 0 %   Basophils Absolute 0.0 0.0 - 0.1 K/uL  Protime-INR     Status: None   Collection Time: 06/17/16 11:12 PM  Result Value Ref Range   Prothrombin Time 13.6 11.4 - 15.2 seconds   INR 1.03   Blood Culture (routine x 2)     Status: None (Preliminary result)   Collection Time: 06/17/16 11:12 PM  Result Value Ref Range   Specimen Description BLOOD RIGHT ANTECUBITAL    Special Requests      IN PEDIATRIC BOTTLE 2CC Performed at [facility], [place] [address], [place], [place] [address]    Culture PENDING    Report Status PENDING   I-Stat CG4 Lactic Acid, ED     Status: Abnormal   Collection Time: 06/17/16 11:22 PM  Result Value Ref Range   Lactic Acid, Venous 2.72 (HH) 0.5 - 1.9 mmol/L   Comment NOTIFIED PHYSICIAN   Influenza panel by PCR (type A & B)     Status: None   Collection Time: 06/18/16  1:30 AM  Result Value Ref Range   Influenza A By PCR NEGATIVE NEGATIVE   Influenza B By PCR NEGATIVE NEGATIVE    Comment: (NOTE) The Xpert Xpress Flu assay is intended as an aid in the diagnosis of  influenza and should not be used as a sole basis for treatment.  This  assay is FDA approved for nasopharyngeal swab specimens only. Nasal  washings and aspirates are unacceptable for Xpert Xpress Flu testing.   Lipase, blood     Status: None    Collection Time: 06/18/16  2:53 AM  Result Value Ref Range   Lipase 18 11 - 51 U/L  CBG monitoring, ED     Status: Abnormal   Collection Time: 06/18/16  5:31 AM  Result Value Ref Range   Glucose-Capillary 437 (H) 65 - 99 mg/dL  Glucose, capillary     Status: Abnormal   Collection Time: 06/18/16 10:07 AM  Result Value Ref Range   Glucose-Capillary 322 (H) 65 - 99 mg/dL   Dg Chest 2 View  Result Date: 06/17/2016 CLINICAL DATA:  Gallbladder and bile duct cancer, rapid breathing EXAM: CHEST  2 VIEW COMPARISON:  03/15/2013 FINDINGS: The heart size and mediastinal contours are within normal limits. Both lungs are clear. Degenerative changes of the spine. Atherosclerosis of the aorta. Partially visualized stent in the right upper quadrant. IMPRESSION: No active cardiopulmonary disease. Electronically Signed   By: Donavan Foil M.D.   On: 06/17/2016 22:49   Ct Abdomen Pelvis W Contrast  Result Date: 06/18/2016 CLINICAL DATA:  Fever. Recent admission for jaundice secondary to metastatic cholangiocarcinoma. Recent ERCP and stent placement. Elevated lactic acid. EXAM: CT ABDOMEN AND PELVIS WITH CONTRAST TECHNIQUE: Multidetector CT imaging of the abdomen and pelvis was performed using the standard protocol following bolus administration of intravenous contrast. CONTRAST:  90m ISOVUE-300 IOPAMIDOL (ISOVUE-300) INJECTION 61% COMPARISON:  MRI abdomen 05/23/2016. CT abdomen and pelvis 05/22/2016. FINDINGS: Lower chest: Mild dependent changes in the lung bases. Coronary artery calcifications. Residual fluid in the esophagus without distention may indicate reflux or dysmotility. Hepatobiliary: Again demonstrated, there is a somewhat poorly defined hypodense 3 x 4.1 cm diameter mass in the medial segment left lobe of the liver adjacent to the gallbladder fossa. The lesion appears larger than on prior study and increasing low-attenuation since previous study suggest possible central necrosis or could indicate  developing abscess/infection given the  history of fever. Mild periportal edema. Portal veins appear patent. Mild bile duct dilatation. Biliary wall stent in place. Large stone in the gallbladder. Mild gallbladder wall thickening. Pancreas: Unremarkable. No pancreatic ductal dilatation or surrounding inflammatory changes. Spleen: Normal in size without focal abnormality. Adrenals/Urinary Tract: No adrenal gland nodules. Punctate stones in the upper pole both kidneys. No hydronephrosis or hydroureter. Bladder wall is not thickened and no bladder filling defects are demonstrated. Stomach/Bowel: Scattered diverticula in the colon. Stool throughout the colon. Colonic wall appears mildly thickened throughout which may indicate colitis. No pericolonic infiltration. Stomach and small bowel are not abnormally distended. Vascular/Lymphatic: Aortic atherosclerosis. No enlarged abdominal or pelvic lymph nodes. Reproductive: Calcified uterine fibroids. No abnormal adnexal masses. Small amount of free fluid in the pelvis is nonspecific but likely reactive. Small malignant ascites not excluded. Other: There is diffuse omental stranding and nodularity with scattered nodule are deposits throughout the peritoneal likely metastatic. No free air in the abdomen. Musculoskeletal: Degenerative changes in the spine and hips. No destructive bone lesions. IMPRESSION: Low-attenuation mass in segment 4 of the liver demonstrates increased size and decreased attenuation since previous study, suggesting central necrosis or developing abscess. Mild bile duct dilatation with biliary stent in place. Cholelithiasis with gallbladder wall thickening. Colon wall appears mildly thickened throughout, possibly indicating colitis. No evidence of bowel obstruction. Diffuse omental caking and peritoneal nodularity likely representing peritoneal metastasis. Aortic atherosclerosis. Punctate size nonobstructing stones in the kidneys. Electronically Signed   By:  Lucienne Capers M.D.   On: 06/18/2016 02:11    Assessment: Metastatic malignancy, presumably cholangiocarcinoma primary Possible developing hepatic abscess Plan:  For now, continue IV antibiotics and hydrate, assess response by temperature curve, white blood cell count and liver function tests. If no clearcut improvement consider consultation with IR for need/feasibility of percutaneous drainage and/or possibly repeat ERCP. Janella Rogala C 06/18/2016, 11:16 AM  Pager [phone] If no answer or after 5 PM call [phone]

## 2016-06-18 NOTE — Progress Notes (Signed)
Chaplain stopped in to see the patient and say hello from the Department of Spiritual Care.  Patient is lying in bed surrounded by family.  When asked if she feels better, patient expresses that she never felt bad.  Patient said none of her food has tasted like anything.    Patient expressed that she can't really have anything until after her procedure tomorrow.  Chaplain shared with the patient that she hoped everything went well tomorrow and asked to pray with the patient.  Patient was happy to receive prayer.    [name]. Chaplain Resident Pager [phone]   06/18/16 1500  Clinical Encounter Type  Visited With Patient and family together  Visit Type Initial  Spiritual Encounters  Spiritual Needs Prayer (Patient expresses never turning down any prayer.)

## 2016-06-18 NOTE — Progress Notes (Signed)
Pharmacy Antibiotic Note  [name] is a 76 y.o. female c/o SOB admitted on 06/17/2016 with intra-abdominal infection.  Pharmacy has been consulted for levaquin dosing.  Plan: Levaquin 750 mg IV q48h Flagyl 500 mg po q8h (MD)  Height: 5\' 8"  (172.7 cm) Weight: 165 lb (74.8 kg) IBW/kg (Calculated) : 63.9  Temp (24hrs), Avg:99.7 F (37.6 C), Min:99.2 F (37.3 C), Max:100.7 F (38.2 C)   Recent Labs Lab 06/17/16 2312 06/17/16 2322  WBC 16.8*  --   CREATININE 1.48*  --   LATICACIDVEN  --  2.72*    Estimated Creatinine Clearance: 33.1 mL/min (by C-G formula based on SCr of 1.48 mg/dL (H)).    Allergies  Allergen Reactions  . Rosiglitazone Maleate Other (See Comments)    couldn't sleep  . Metformin Diarrhea  . Penicillins Hives and Rash    Has patient had a PCN reaction causing immediate rash, facial/tongue/throat swelling, SOB or lightheadedness with hypotension: yes Has patient had a PCN reaction causing severe rash involving mucus membranes or skin necrosis: no Has patient had a PCN reaction that required hospitalization:no Has patient had a PCN reaction occurring within the last 10 years: no If all of the above answers are "NO", then may proceed with Cephalosporin use.  . Pioglitazone Other (See Comments)    Joint pain     Antimicrobials this admission: 2/7 levaquin >>  2/7 flagyl >>   Dose adjustments this admission:   Microbiology results:  BCx:   UCx:    Sputum:    MRSA PCR:   Thank you for allowing pharmacy to be a part of this patient's care.  Lawana Pai R 06/18/2016 6:00 AM

## 2016-06-18 NOTE — Plan of Care (Signed)
Problem: Safety: Goal: Ability to remain free from injury will improve Outcome: Completed/Met Date Met: 06/18/16 BED ALARM SET, discussed safety prevention plan with patient and family. Pt is agreeable

## 2016-06-18 NOTE — ED Notes (Addendum)
MD notified of elevated CBG. Advised to give pt 9 units of novolog

## 2016-06-18 NOTE — Progress Notes (Signed)
Patient admitted by Dr Alcario Drought this morning. Seen her on rounds. She is doing better this morning. Doesn't have any new complaints. Tentative plans for possible chemo once Bili is better per outpatient Onc, Dr Benay Spice. She has an appt in 2 weeks. Meanwhile she remains on IV Abx.  Will cont to monitor her on IV Abx, and give her IVF. If her Bili continues to rise, she may need to have a drain placed by the IR or need an ERCP per Gi.  I have also advised she may benefit from a Palliative care consult to establish goals of care prior to her discharge.

## 2016-06-18 NOTE — ED Notes (Signed)
Unable to obtain IV access, IV team consult placed 

## 2016-06-19 DIAGNOSIS — E876 Hypokalemia: Secondary | ICD-10-CM

## 2016-06-19 LAB — CBC
HEMATOCRIT: 29.2 % — AB (ref 36.0–46.0)
HEMOGLOBIN: 9.8 g/dL — AB (ref 12.0–15.0)
MCH: 25.7 pg — AB (ref 26.0–34.0)
MCHC: 33.6 g/dL (ref 30.0–36.0)
MCV: 76.4 fL — AB (ref 78.0–100.0)
Platelets: 274 10*3/uL (ref 150–400)
RBC: 3.82 MIL/uL — AB (ref 3.87–5.11)
RDW: 19.6 % — ABNORMAL HIGH (ref 11.5–15.5)
WBC: 10 10*3/uL (ref 4.0–10.5)

## 2016-06-19 LAB — COMPREHENSIVE METABOLIC PANEL
ALBUMIN: 2.5 g/dL — AB (ref 3.5–5.0)
ALT: 109 U/L — ABNORMAL HIGH (ref 14–54)
ANION GAP: 11 (ref 5–15)
AST: 126 U/L — AB (ref 15–41)
Alkaline Phosphatase: 457 U/L — ABNORMAL HIGH (ref 38–126)
BILIRUBIN TOTAL: 9.5 mg/dL — AB (ref 0.3–1.2)
BUN: 24 mg/dL — AB (ref 6–20)
CHLORIDE: 98 mmol/L — AB (ref 101–111)
CO2: 26 mmol/L (ref 22–32)
Calcium: 8.6 mg/dL — ABNORMAL LOW (ref 8.9–10.3)
Creatinine, Ser: 1.7 mg/dL — ABNORMAL HIGH (ref 0.44–1.00)
GFR calc Af Amer: 33 mL/min — ABNORMAL LOW (ref >60)
GFR calc non Af Amer: 28 mL/min — ABNORMAL LOW (ref >60)
GLUCOSE: 183 mg/dL — AB (ref 65–99)
POTASSIUM: 3 mmol/L — AB (ref 3.5–5.1)
SODIUM: 135 mmol/L (ref 135–145)
TOTAL PROTEIN: 6.9 g/dL (ref 6.5–8.1)

## 2016-06-19 LAB — PROTIME-INR
INR: 1.13
Prothrombin Time: 14.5 seconds (ref 11.4–15.2)

## 2016-06-19 LAB — GLUCOSE, CAPILLARY
GLUCOSE-CAPILLARY: 185 mg/dL — AB (ref 65–99)
GLUCOSE-CAPILLARY: 172 mg/dL — AB (ref 65–99)
GLUCOSE-CAPILLARY: 244 mg/dL — AB (ref 65–99)
GLUCOSE-CAPILLARY: 234 mg/dL — AB (ref 65–99)
Glucose-Capillary: 242 mg/dL — ABNORMAL HIGH (ref 65–99)

## 2016-06-19 LAB — MAGNESIUM: Magnesium: 2 mg/dL (ref 1.7–2.4)

## 2016-06-19 LAB — AMMONIA: AMMONIA: 41 umol/L — AB (ref 9–35)

## 2016-06-19 LAB — APTT: aPTT: 31 seconds (ref 24–36)

## 2016-06-19 MED ORDER — POTASSIUM CHLORIDE CRYS ER 20 MEQ PO TBCR
40.0000 meq | EXTENDED_RELEASE_TABLET | ORAL | Status: AC
  Administered 2016-06-19 (×3): 40 meq via ORAL
  Filled 2016-06-19 (×3): qty 2

## 2016-06-19 MED ORDER — INSULIN ASPART 100 UNIT/ML ~~LOC~~ SOLN
0.0000 [IU] | Freq: Three times a day (TID) | SUBCUTANEOUS | Status: DC
  Administered 2016-06-19 (×2): 3 [IU] via SUBCUTANEOUS
  Administered 2016-06-20: 7 [IU] via SUBCUTANEOUS
  Administered 2016-06-20 (×2): 5 [IU] via SUBCUTANEOUS
  Administered 2016-06-21: 1 [IU] via SUBCUTANEOUS

## 2016-06-19 MED ORDER — INSULIN ASPART 100 UNIT/ML ~~LOC~~ SOLN: 0.0000 [IU] | Freq: Every day | SUBCUTANEOUS | Status: DC

## 2016-06-19 MED ORDER — INSULIN GLARGINE 100 UNIT/ML ~~LOC~~ SOLN
20.0000 [IU] | Freq: Every day | SUBCUTANEOUS | Status: DC
  Administered 2016-06-19: 20 [IU] via SUBCUTANEOUS
  Filled 2016-06-19 (×2): qty 0.2

## 2016-06-19 NOTE — Progress Notes (Signed)
[facility] Progress Note  Subjective: Afebrile, feels okay  Objective: Vital signs in last 24 hours: Temp:  [98.3 F (36.8 C)-99.3 F (37.4 C)] 98.3 F (36.8 C) (02/08 0528) Pulse Rate:  [90-92] 90 (02/08 0528) Resp:  [17-18] 17 (02/08 0528) BP: (137)/(65-70) 137/65 (02/08 0958) SpO2:  [98 %-100 %] 100 % (02/08 0528) Weight change: -2.948 kg (-6 lb 8 oz)   PE: Unchanged  Lab Results: Results for orders placed or performed during the hospital encounter of 06/17/16 (from the past 24 hour(s))  Glucose, capillary     Status: Abnormal   Collection Time: 06/18/16  4:59 PM  Result Value Ref Range   Glucose-Capillary 383 (H) 65 - 99 mg/dL  Glucose, capillary     Status: Abnormal   Collection Time: 06/18/16  8:53 PM  Result Value Ref Range   Glucose-Capillary 279 (H) 65 - 99 mg/dL  Glucose, capillary     Status: Abnormal   Collection Time: 06/18/16 10:31 PM  Result Value Ref Range   Glucose-Capillary 203 (H) 65 - 99 mg/dL  Comprehensive metabolic panel     Status: Abnormal   Collection Time: 06/19/16  4:58 AM  Result Value Ref Range   Sodium 135 135 - 145 mmol/L   Potassium 3.0 (L) 3.5 - 5.1 mmol/L   Chloride 98 (L) 101 - 111 mmol/L   CO2 26 22 - 32 mmol/L   Glucose, Bld 183 (H) 65 - 99 mg/dL   BUN 24 (H) 6 - 20 mg/dL   Creatinine, Ser 1.70 (H) 0.44 - 1.00 mg/dL   Calcium 8.6 (L) 8.9 - 10.3 mg/dL   Total Protein 6.9 6.5 - 8.1 g/dL   Albumin 2.5 (L) 3.5 - 5.0 g/dL   AST 126 (H) 15 - 41 U/L   ALT 109 (H) 14 - 54 U/L   Alkaline Phosphatase 457 (H) 38 - 126 U/L   Total Bilirubin 9.5 (H) 0.3 - 1.2 mg/dL   GFR calc non Af Amer 28 (L) >60 mL/min   GFR calc Af Amer 33 (L) >60 mL/min   Anion gap 11 5 - 15  Magnesium     Status: None   Collection Time: 06/19/16  4:58 AM  Result Value Ref Range   Magnesium 2.0 1.7 - 2.4 mg/dL  CBC     Status: Abnormal   Collection Time: 06/19/16  4:58 AM  Result Value Ref Range   WBC 10.0 4.0 - 10.5 K/uL   RBC 3.82 (L) 3.87 -  5.11 MIL/uL   Hemoglobin 9.8 (L) 12.0 - 15.0 g/dL   HCT 29.2 (L) 36.0 - 46.0 %   MCV 76.4 (L) 78.0 - 100.0 fL   MCH 25.7 (L) 26.0 - 34.0 pg   MCHC 33.6 30.0 - 36.0 g/dL   RDW 19.6 (H) 11.5 - 15.5 %   Platelets 274 150 - 400 K/uL  APTT     Status: None   Collection Time: 06/19/16  4:58 AM  Result Value Ref Range   aPTT 31 24 - 36 seconds  Protime-INR     Status: None   Collection Time: 06/19/16  4:58 AM  Result Value Ref Range   Prothrombin Time 14.5 11.4 - 15.2 seconds   INR 1.13   Glucose, capillary     Status: Abnormal   Collection Time: 06/19/16  5:26 AM  Result Value Ref Range   Glucose-Capillary 185 (H) 65 - 99 mg/dL  Glucose, capillary     Status: Abnormal   Collection Time:  06/19/16  7:28 AM  Result Value Ref Range   Glucose-Capillary 172 (H) 65 - 99 mg/dL  Ammonia     Status: Abnormal   Collection Time: 06/19/16  9:07 AM  Result Value Ref Range   Ammonia 41 (H) 9 - 35 umol/L  Glucose, capillary     Status: Abnormal   Collection Time: 06/19/16 11:41 AM  Result Value Ref Range   Glucose-Capillary 244 (H) 65 - 99 mg/dL    Studies/Results: Dg Chest 2 View  Result Date: 06/17/2016 CLINICAL DATA:  Gallbladder and bile duct cancer, rapid breathing EXAM: CHEST  2 VIEW COMPARISON:  03/15/2013 FINDINGS: The heart size and mediastinal contours are within normal limits. Both lungs are clear. Degenerative changes of the spine. Atherosclerosis of the aorta. Partially visualized stent in the right upper quadrant. IMPRESSION: No active cardiopulmonary disease. Electronically Signed   By: Donavan Foil M.D.   On: 06/17/2016 22:49   Ct Abdomen Pelvis W Contrast  Result Date: 06/18/2016 CLINICAL DATA:  Fever. Recent admission for jaundice secondary to metastatic cholangiocarcinoma. Recent ERCP and stent placement. Elevated lactic acid. EXAM: CT ABDOMEN AND PELVIS WITH CONTRAST TECHNIQUE: Multidetector CT imaging of the abdomen and pelvis was performed using the standard protocol  following bolus administration of intravenous contrast. CONTRAST:  76mL ISOVUE-300 IOPAMIDOL (ISOVUE-300) INJECTION 61% COMPARISON:  MRI abdomen 05/23/2016. CT abdomen and pelvis 05/22/2016. FINDINGS: Lower chest: Mild dependent changes in the lung bases. Coronary artery calcifications. Residual fluid in the esophagus without distention may indicate reflux or dysmotility. Hepatobiliary: Again demonstrated, there is a somewhat poorly defined hypodense 3 x 4.1 cm diameter mass in the medial segment left lobe of the liver adjacent to the gallbladder fossa. The lesion appears larger than on prior study and increasing low-attenuation since previous study suggest possible central necrosis or could indicate developing abscess/infection given the history of fever. Mild periportal edema. Portal veins appear patent. Mild bile duct dilatation. Biliary wall stent in place. Large stone in the gallbladder. Mild gallbladder wall thickening. Pancreas: Unremarkable. No pancreatic ductal dilatation or surrounding inflammatory changes. Spleen: Normal in size without focal abnormality. Adrenals/Urinary Tract: No adrenal gland nodules. Punctate stones in the upper pole both kidneys. No hydronephrosis or hydroureter. Bladder wall is not thickened and no bladder filling defects are demonstrated. Stomach/Bowel: Scattered diverticula in the colon. Stool throughout the colon. Colonic wall appears mildly thickened throughout which may indicate colitis. No pericolonic infiltration. Stomach and small bowel are not abnormally distended. Vascular/Lymphatic: Aortic atherosclerosis. No enlarged abdominal or pelvic lymph nodes. Reproductive: Calcified uterine fibroids. No abnormal adnexal masses. Small amount of free fluid in the pelvis is nonspecific but likely reactive. Small malignant ascites not excluded. Other: There is diffuse omental stranding and nodularity with scattered nodule are deposits throughout the peritoneal likely metastatic. No  free air in the abdomen. Musculoskeletal: Degenerative changes in the spine and hips. No destructive bone lesions. IMPRESSION: Low-attenuation mass in segment 4 of the liver demonstrates increased size and decreased attenuation since previous study, suggesting central necrosis or developing abscess. Mild bile duct dilatation with biliary stent in place. Cholelithiasis with gallbladder wall thickening. Colon wall appears mildly thickened throughout, possibly indicating colitis. No evidence of bowel obstruction. Diffuse omental caking and peritoneal nodularity likely representing peritoneal metastasis. Aortic atherosclerosis. Punctate size nonobstructing stones in the kidneys. Electronically Signed   By: Lucienne Capers M.D.   On: 06/18/2016 02:11      Assessment: Fever with bile duct obstruction possibly representing small developing abscess or low-grade cholangitis,  currently doing better with antibiotics.  Plan: Continue present management for another 1-2 days. If improvement bile parameters does not continue, consider repeat imaging and possible interventional radiology consult. No clear need for ERCP that I can tell at present.    Jowell Bossi C 06/19/2016, 1:03 PM  Pager [phone] If no answer or after 5 PM call [phone]

## 2016-06-19 NOTE — Care Management Note (Signed)
Case Management Note  Patient Details  Name: [name] MRN: [id number] Date of Birth: 1940-09-16  Subjective/Objective:  76 y/o f admitted w/obstructive jaundice d/t Cancer. From home.                  Action/Plan:d/c plan home.   Expected Discharge Date:   (UNKNOWN)               Expected Discharge Plan:  Home/Self Care  In-House Referral:     Discharge planning Services  CM Consult  Post Acute Care Choice:    Choice offered to:     DME Arranged:    DME Agency:     HH Arranged:    HH Agency:     Status of Service:  In process, will continue to follow  If discussed at Long Length of Stay Meetings, dates discussed:    Additional Comments:  Dessa Phi, RN 06/19/2016, 1:19 PM

## 2016-06-19 NOTE — Progress Notes (Signed)
PROGRESS NOTE    [name]  [id number] DOB: May 13, 1940 DOA: 06/17/2016 PCP: Lilian Coma, MD   Outpatient Specialists:     Brief Narrative:  [name] is a 76 y.o. 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  The cancer was diagnosed during a hospital stay last month from 1/11 to 1/19 when she was admitted for LFT elevation, loss of appetite, whiteish stools.  During that admission (on 1/17) a biliary stent was placed by Dr. Paulita Fujita in her CBD for obstructive jaundice and brush biopsy was taken of the neoplasm (which would come back later as adenocarcinoma favoring a GI primary).  Since that time she has had alternating brownish stools with white stools but has continued to feel somewhat poor.  Today the patient presents to the ED after an episode of "jerking" with increased labored breathing.  Patient was fully alert and responsive during this episode.  Symptoms have resolved.  Family is also concerned that she is hyperglycemic today.  Patient states she "feels fine".   Assessment & Plan:   Principal Problem:   Obstructive jaundice due to cancer [facility]) Active Problems:   Uncontrolled diabetes mellitus (HCC)   Sepsis ([place])   Metastatic adenocarcinoma (HCC)   Obstructive Jaundice due to metastatic adenocarcinoma - Appreciate Dr. Amedeo Plenty' instructions IVF ABx Concerning that numbers are worsening, especially as per Dr. Gearldine Shown 1/24 office note: "She will not be a candidate for chemotherapy until the bilirubin improves." Patient on clear diet  Sepsis - IVF Will keep patient on levaquin and flagyl PCN allergy noted UA ordered but still not done     DM2 - Hold home 50/50 Lantus- will increase lantus while off 50/50 SSI    Hypokalemia   -d/c HCTZ   -replace PO   DVT prophylaxis:  SCD's  Code Status: Full Code   Family  Communication: No family at bedside  Disposition Plan:     Consultants:   GI    Subjective: Says her taste for food is coming back No pain  Objective: Vitals:   06/18/16 0838 06/18/16 1229 06/18/16 2235 06/19/16 0528  BP: 134/73 140/64 137/69 137/70  Pulse: 93 85 92 90  Resp:  18 18 17   Temp: 97.8 F (36.6 C) 98.6 F (37 C) 99.3 F (37.4 C) 98.3 F (36.8 C)  TempSrc: Other (Comment) Oral Oral Oral  SpO2: 100% 100% 98% 100%  Weight: 71.9 kg (158 lb 8 oz)     Height: 5\' 7"  (1.702 m)       Intake/Output Summary (Last 24 hours) at 06/19/16 0851 Last data filed at 06/19/16 0200  Gross per 24 hour  Intake              150 ml  Output                0 ml  Net              150 ml   Filed Weights   06/18/16 0031 06/18/16 0838  Weight: 74.8 kg (165 lb) 71.9 kg (158 lb 8 oz)    Examination:  General exam: Appears calm and comfortable  Respiratory system: Clear to auscultation. Respiratory effort normal. Cardiovascular system: S1 & S2 heard, RRR. No JVD, murmurs, rubs, gallops or clicks. No pedal edema. Gastrointestinal system: Abdomen is nondistended, soft and nontender. No organomegaly or masses felt. Sluggish bowel sounds heard. Central nervous system: Alert and oriented.  No focal neurological deficits.     Data Reviewed: I have personally reviewed following labs and imaging studies  CBC:  Recent Labs Lab 06/17/16 2312 06/19/16 0458  WBC 16.8* 10.0  NEUTROABS 12.9*  --   HGB 10.9* 9.8*  HCT 31.4* 29.2*  MCV 76.6* 76.4*  PLT [id number]   Basic Metabolic Panel:  Recent Labs Lab 06/17/16 2312 06/19/16 0458  NA 130* 135  K 3.5 3.0*  CL 90* 98*  CO2 25 26  GLUCOSE 432* 183*  BUN 30* 24*  CREATININE 1.48* 1.70*  CALCIUM 9.3 8.6*  MG  --  2.0   GFR: Estimated Creatinine Clearance: 27.8 mL/min (by C-G formula based on SCr of 1.7 mg/dL (H)). Liver Function Tests:  Recent Labs Lab 06/17/16 2312 06/19/16 0458  AST 179* 126*  ALT 152* 109*   ALKPHOS 623* 457*  BILITOT 11.0* 9.5*  PROT 8.3* 6.9  ALBUMIN 3.1* 2.5*    Recent Labs Lab 06/18/16 0253  LIPASE 18   No results for input(s): AMMONIA in the last 168 hours. Coagulation Profile:  Recent Labs Lab 06/17/16 2312 06/19/16 0458  INR 1.03 1.13   Cardiac Enzymes: No results for input(s): CKTOTAL, CKMB, CKMBINDEX, TROPONINI in the last 168 hours. BNP (last 3 results) No results for input(s): PROBNP in the last 8760 hours. HbA1C: No results for input(s): HGBA1C in the last 72 hours. CBG:  Recent Labs Lab 06/18/16 1659 06/18/16 2053 06/18/16 2231 06/19/16 0526 06/19/16 0728  GLUCAP 383* 279* 203* 185* 172*   Lipid Profile: No results for input(s): CHOL, HDL, LDLCALC, TRIG, CHOLHDL, LDLDIRECT in the last 72 hours. Thyroid Function Tests: No results for input(s): TSH, T4TOTAL, FREET4, T3FREE, THYROIDAB in the last 72 hours. Anemia Panel: No results for input(s): VITAMINB12, FOLATE, FERRITIN, TIBC, IRON, RETICCTPCT in the last 72 hours. Urine analysis:    Component Value Date/Time   COLORURINE AMBER (A) 05/22/2016 1700   APPEARANCEUR CLOUDY (A) 05/22/2016 1700   LABSPEC 1.012 05/22/2016 1700   PHURINE 5.0 05/22/2016 1700   GLUCOSEU 150 (A) 05/22/2016 1700   HGBUR LARGE (A) 05/22/2016 1700   BILIRUBINUR SMALL (A) 05/22/2016 1700   KETONESUR NEGATIVE 05/22/2016 1700   PROTEINUR 100 (A) 05/22/2016 1700   UROBILINOGEN 0.2 12/03/2013 2155   NITRITE NEGATIVE 05/22/2016 1700   LEUKOCYTESUR LARGE (A) 05/22/2016 1700     ) Recent Results (from the past 240 hour(s))  Blood Culture (routine x 2)     Status: None (Preliminary result)   Collection Time: 06/17/16 11:12 PM  Result Value Ref Range Status   Specimen Description BLOOD RIGHT ANTECUBITAL  Final   Special Requests   Final    IN PEDIATRIC BOTTLE 2CC Performed at [facility], [place] [address], [place], [place] [address]    Culture PENDING  Incomplete   Report Status PENDING  Incomplete       Anti-infectives    Start     Dose/Rate Route Frequency Ordered Stop   06/19/16 0000  levofloxacin (LEVAQUIN) IVPB 750 mg     750 mg 100 mL/hr over 90 Minutes Intravenous Every 48 hours 06/18/16 0547     06/18/16 0600  metroNIDAZOLE (FLAGYL) tablet 500 mg     500 mg Oral Every 8 hours 06/18/16 0536     06/18/16 0015  levofloxacin (LEVAQUIN) IVPB 750 mg     750 mg 100 mL/hr over 90 Minutes Intravenous  Once 06/18/16 0014 06/18/16 0315   06/18/16 0015  metroNIDAZOLE (FLAGYL) IVPB 500  mg     500 mg 100 mL/hr over 60 Minutes Intravenous  Once 06/18/16 0014         Radiology Studies: Dg Chest 2 View  Result Date: 06/17/2016 CLINICAL DATA:  Gallbladder and bile duct cancer, rapid breathing EXAM: CHEST  2 VIEW COMPARISON:  03/15/2013 FINDINGS: The heart size and mediastinal contours are within normal limits. Both lungs are clear. Degenerative changes of the spine. Atherosclerosis of the aorta. Partially visualized stent in the right upper quadrant. IMPRESSION: No active cardiopulmonary disease. Electronically Signed   By: Donavan Foil M.D.   On: 06/17/2016 22:49   Ct Abdomen Pelvis W Contrast  Result Date: 06/18/2016 CLINICAL DATA:  Fever. Recent admission for jaundice secondary to metastatic cholangiocarcinoma. Recent ERCP and stent placement. Elevated lactic acid. EXAM: CT ABDOMEN AND PELVIS WITH CONTRAST TECHNIQUE: Multidetector CT imaging of the abdomen and pelvis was performed using the standard protocol following bolus administration of intravenous contrast. CONTRAST:  60mL ISOVUE-300 IOPAMIDOL (ISOVUE-300) INJECTION 61% COMPARISON:  MRI abdomen 05/23/2016. CT abdomen and pelvis 05/22/2016. FINDINGS: Lower chest: Mild dependent changes in the lung bases. Coronary artery calcifications. Residual fluid in the esophagus without distention may indicate reflux or dysmotility. Hepatobiliary: Again demonstrated, there is a somewhat poorly defined hypodense 3 x 4.1 cm diameter mass in the medial  segment left lobe of the liver adjacent to the gallbladder fossa. The lesion appears larger than on prior study and increasing low-attenuation since previous study suggest possible central necrosis or could indicate developing abscess/infection given the history of fever. Mild periportal edema. Portal veins appear patent. Mild bile duct dilatation. Biliary wall stent in place. Large stone in the gallbladder. Mild gallbladder wall thickening. Pancreas: Unremarkable. No pancreatic ductal dilatation or surrounding inflammatory changes. Spleen: Normal in size without focal abnormality. Adrenals/Urinary Tract: No adrenal gland nodules. Punctate stones in the upper pole both kidneys. No hydronephrosis or hydroureter. Bladder wall is not thickened and no bladder filling defects are demonstrated. Stomach/Bowel: Scattered diverticula in the colon. Stool throughout the colon. Colonic wall appears mildly thickened throughout which may indicate colitis. No pericolonic infiltration. Stomach and small bowel are not abnormally distended. Vascular/Lymphatic: Aortic atherosclerosis. No enlarged abdominal or pelvic lymph nodes. Reproductive: Calcified uterine fibroids. No abnormal adnexal masses. Small amount of free fluid in the pelvis is nonspecific but likely reactive. Small malignant ascites not excluded. Other: There is diffuse omental stranding and nodularity with scattered nodule are deposits throughout the peritoneal likely metastatic. No free air in the abdomen. Musculoskeletal: Degenerative changes in the spine and hips. No destructive bone lesions. IMPRESSION: Low-attenuation mass in segment 4 of the liver demonstrates increased size and decreased attenuation since previous study, suggesting central necrosis or developing abscess. Mild bile duct dilatation with biliary stent in place. Cholelithiasis with gallbladder wall thickening. Colon wall appears mildly thickened throughout, possibly indicating colitis. No evidence of  bowel obstruction. Diffuse omental caking and peritoneal nodularity likely representing peritoneal metastasis. Aortic atherosclerosis. Punctate size nonobstructing stones in the kidneys. Electronically Signed   By: Lucienne Capers M.D.   On: 06/18/2016 02:11        Scheduled Meds: . irbesartan  300 mg Oral Daily   And  . amLODipine  10 mg Oral Daily  . aspirin EC  81 mg Oral Daily  . atorvastatin  20 mg Oral Daily  . ferrous sulfate  325 mg Oral BID WC  . insulin aspart  0-9 Units Subcutaneous Q4H  . insulin glargine  10 Units Subcutaneous Daily  .  levofloxacin (LEVAQUIN) IV  750 mg Intravenous Q48H  . metronidazole  500 mg Intravenous Once  . metroNIDAZOLE  500 mg Oral Q8H  . multivitamin with minerals  1 tablet Oral Daily  . potassium chloride  40 mEq Oral Q2H  . sodium chloride  250 mL Intravenous Once   Continuous Infusions:   LOS: 1 day    Time spent: 25 min    Mabel, DO [facility] Pager [phone]  If 7PM-7AM, please contact night-coverage [email or web address] Password [facility] 06/19/2016, 8:51 AM

## 2016-06-20 DIAGNOSIS — N179 Acute kidney failure, unspecified: Secondary | ICD-10-CM

## 2016-06-20 DIAGNOSIS — C801 Malignant (primary) neoplasm, unspecified: Secondary | ICD-10-CM

## 2016-06-20 DIAGNOSIS — K831 Obstruction of bile duct: Secondary | ICD-10-CM

## 2016-06-20 DIAGNOSIS — D649 Anemia, unspecified: Secondary | ICD-10-CM

## 2016-06-20 LAB — GLUCOSE, CAPILLARY
GLUCOSE-CAPILLARY: 341 mg/dL — AB (ref 65–99)
Glucose-Capillary: 265 mg/dL — ABNORMAL HIGH (ref 65–99)
Glucose-Capillary: 274 mg/dL — ABNORMAL HIGH (ref 65–99)
Glucose-Capillary: 168 mg/dL — ABNORMAL HIGH (ref 65–99)

## 2016-06-20 LAB — COMPREHENSIVE METABOLIC PANEL
ALK PHOS: 397 U/L — AB (ref 38–126)
ALT: 93 U/L — AB (ref 14–54)
AST: 110 U/L — AB (ref 15–41)
Albumin: 2.4 g/dL — ABNORMAL LOW (ref 3.5–5.0)
Anion gap: 9 (ref 5–15)
BUN: 28 mg/dL — AB (ref 6–20)
CALCIUM: 8.5 mg/dL — AB (ref 8.9–10.3)
CHLORIDE: 102 mmol/L (ref 101–111)
CO2: 24 mmol/L (ref 22–32)
CREATININE: 2.13 mg/dL — AB (ref 0.44–1.00)
GFR, EST AFRICAN AMERICAN: 25 mL/min — AB (ref >60)
GFR, EST NON AFRICAN AMERICAN: 22 mL/min — AB (ref >60)
Glucose, Bld: 262 mg/dL — ABNORMAL HIGH (ref 65–99)
Potassium: 4.1 mmol/L (ref 3.5–5.1)
Sodium: 135 mmol/L (ref 135–145)
Total Bilirubin: 9.1 mg/dL — ABNORMAL HIGH (ref 0.3–1.2)
Total Protein: 7.1 g/dL (ref 6.5–8.1)

## 2016-06-20 LAB — MAGNESIUM: Magnesium: 2 mg/dL (ref 1.7–2.4)

## 2016-06-20 LAB — CBC
HCT: 27 % — ABNORMAL LOW (ref 36.0–46.0)
HEMOGLOBIN: 9.3 g/dL — AB (ref 12.0–15.0)
MCH: 26.4 pg (ref 26.0–34.0)
MCHC: 34.4 g/dL (ref 30.0–36.0)
MCV: 76.7 fL — AB (ref 78.0–100.0)
PLATELETS: 276 10*3/uL (ref 150–400)
RBC: 3.52 MIL/uL — AB (ref 3.87–5.11)
RDW: 19.9 % — ABNORMAL HIGH (ref 11.5–15.5)
WBC: 8.3 10*3/uL (ref 4.0–10.5)

## 2016-06-20 LAB — URINALYSIS, ROUTINE W REFLEX MICROSCOPIC
Glucose, UA: 150 mg/dL — AB
Ketones, ur: NEGATIVE mg/dL
NITRITE: NEGATIVE
PROTEIN: 100 mg/dL — AB
Specific Gravity, Urine: 1.017 (ref 1.005–1.030)
Trans Epithel, UA: 1
pH: 5 (ref 5.0–8.0)

## 2016-06-20 MED ORDER — LACTULOSE 10 GM/15ML PO SOLN
30.0000 g | Freq: Two times a day (BID) | ORAL | Status: DC | PRN
  Administered 2016-06-20: 30 g via ORAL
  Filled 2016-06-20: qty 45

## 2016-06-20 MED ORDER — LEVOFLOXACIN IN D5W 500 MG/100ML IV SOLN: 500.0000 mg | INTRAVENOUS | Status: DC

## 2016-06-20 MED ORDER — INSULIN GLARGINE 100 UNIT/ML ~~LOC~~ SOLN
25.0000 [IU] | Freq: Every day | SUBCUTANEOUS | Status: DC
  Administered 2016-06-20 – 2016-06-21 (×2): 25 [IU] via SUBCUTANEOUS
  Filled 2016-06-20 (×3): qty 0.25

## 2016-06-20 MED ORDER — SODIUM CHLORIDE 0.9 % IV SOLN
INTRAVENOUS | Status: DC
  Administered 2016-06-20: 10:00:00 via INTRAVENOUS

## 2016-06-20 NOTE — Progress Notes (Signed)
Inpatient Diabetes Program Recommendations  AACE/ADA: New Consensus Statement on Inpatient Glycemic Control (2015)  Target Ranges:  Prepandial:   less than 140 mg/dL      Peak postprandial:   less than 180 mg/dL (1-2 hours)      Critically ill patients:  140 - 180 mg/dL   Results for [name], [name] (MRN [id number]) as of 06/20/2016 09:36  Ref. Range 06/19/2016 07:28 06/19/2016 11:41 06/19/2016 16:35 06/19/2016 20:42  Glucose-Capillary Latest Ref Range: 65 - 99 mg/dL 172 (H) 244 (H) 234 (H) 242 (H)   Results for [name], [name] (MRN [id number]) as of 06/20/2016 09:36  Ref. Range 06/20/2016 07:29  Glucose-Capillary Latest Ref Range: 65 - 99 mg/dL 265 (H)    Home DM Meds: Humalog 50/50 Insulin- 40 units AM with breakfast/ 10 units PM with supper  Current Insulin Orders: Lantus 25 units daily                                       Novolog Sensitive Correction Scale/ SSI (0-9 units) TID AC + HS       MD- Note Lantus increased to 25 units daily today.  Still having glucose elevations.  Please consider increasing Novolog Correction Scale/ SSI to Moderate scale (0-15 units) TID AC + HS (currently ordered as Sensitive scale 0-9 units)     --Will follow patient during hospitalization--  Wyn Quaker RN, MSN, CDE Diabetes Coordinator Inpatient Glycemic Control Team Team Pager: [phone] (8a-5p)

## 2016-06-20 NOTE — Progress Notes (Signed)
Pharmacy Antibiotic Note  [name] is a 76 y.o. female c/o SOB admitted on 06/17/2016 with intra-abdominal infection.  Pharmacy has been consulted for levaquin dosing.  Day #3 antibiotics  Scr trending up   WBC WNL  Improving on antibiotics per notes  Ucx collected today (on abx for > 2 days)  Plan:  Due to increasing SCr, change levofloxaxin to 500mg  IV q48h  Patient prescribed cephalexin in past, consider change to cephalosporin to avoid toxicity of quinolones, especially in elderly with decreased renal fx  Height: 5\' 7"  (170.2 cm) Weight: 158 lb 8 oz (71.9 kg) IBW/kg (Calculated) : 61.6  Temp (24hrs), Avg:98.5 F (36.9 C), Min:98.4 F (36.9 C), Max:98.7 F (37.1 C)   Recent Labs Lab 06/17/16 2312 06/17/16 2322 06/19/16 0458 06/20/16 0440  WBC 16.8*  --  10.0 8.3  CREATININE 1.48*  --  1.70* 2.13*  LATICACIDVEN  --  2.72*  --   --     Estimated Creatinine Clearance: 22.2 mL/min (by C-G formula based on SCr of 2.13 mg/dL (H)).    Allergies  Allergen Reactions  . Rosiglitazone Maleate Other (See Comments)    couldn't sleep  . Metformin Diarrhea  . Penicillins Hives and Rash    Has patient had a PCN reaction causing immediate rash, facial/tongue/throat swelling, SOB or lightheadedness with hypotension: yes Has patient had a PCN reaction causing severe rash involving mucus membranes or skin necrosis: no Has patient had a PCN reaction that required hospitalization:no Has patient had a PCN reaction occurring within the last 10 years: no If all of the above answers are "NO", then may proceed with Cephalosporin use.  . Pioglitazone Other (See Comments)    Joint pain     Antimicrobials this admission: 2/7 levaquin >>  2/7 flagyl >>   Dose adjustments this admission:   Microbiology results: Microbiology results:  2/6 BCx: NGTD 2/7 BCx: NGTD 2/7 flu: neg 2/9 Urine ordered: pending (collected 2-days after start abx)   Thank you for allowing  pharmacy to be a part of this patient's care.  Doreene Eland, PharmD, BCPS.   Pager: [phone] 06/20/2016 11:24 AM

## 2016-06-20 NOTE — Progress Notes (Signed)
PROGRESS NOTE    [name]  [id number] DOB: 1941/04/19 DOA: 06/17/2016 PCP: Lilian Coma, MD   Outpatient Specialists:     Brief Narrative:  [name] is a 76 y.o. 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  The cancer was diagnosed during a hospital stay last month from 1/11 to 1/19 when she was admitted for LFT elevation, loss of appetite, whiteish stools.  During that admission (on 1/17) a biliary stent was placed by Dr. Paulita Fujita in her CBD for obstructive jaundice and brush biopsy was taken of the neoplasm (which would come back later as adenocarcinoma favoring a GI primary).  Since that time she has had alternating brownish stools with white stools but has continued to feel somewhat poor.  Today the patient presents to the ED after an episode of "jerking" with increased labored breathing.  Patient was fully alert and responsive during this episode.  Symptoms have resolved.  Family is also concerned that she is hyperglycemic today.  Patient states she "feels fine".   Assessment & Plan:   Principal Problem:   Obstructive jaundice due to cancer [facility]) Active Problems:   Uncontrolled diabetes mellitus (HCC)   Sepsis ([place])   Metastatic adenocarcinoma ([place])   Obstructive Jaundice due to metastatic adenocarcinoma - Appreciate Dr. Amedeo Plenty' instructions ABx IV continued for 1-2 more days Concerning that numbers are worsening, especially as per Dr. Gearldine Shown 1/24 office note: "She will not be a candidate for chemotherapy until the bilirubin improves." Patient on carb mod diet  Sepsis - Will keep patient on levaquin and flagyl PCN allergy noted U/A shows ? UTI but culture sent after 2 days of abx     DM2 - Hold home 50/50 Lantus- will increase lantus while off 50/50 SSI    Hypokalemia   -d/c HCTZ   -replace PO   AKI   -IVF   -in January, Cr  was 1.13   Constipation   -lactulose PRN  Defer palliative care consult to Dr. Benay Spice   DVT prophylaxis:  SCD's  Code Status: Full Code   Family Communication: No family at bedside  Disposition Plan:     Consultants:   GI    Subjective: Says she has lived a good 43 years, and knows she does not have long on the earth left  Objective: Vitals:   06/19/16 0958 06/19/16 1638 06/19/16 2043 06/20/16 0453  BP: 137/65 131/64 126/67 116/68  Pulse:  92 90 95  Resp:  18 18 16   Temp:  98.4 F (36.9 C) 98.4 F (36.9 C) 98.7 F (37.1 C)  TempSrc:  Oral Oral Oral  SpO2:  99% 100% 100%  Weight:      Height:       No intake or output data in the 24 hours ending 06/20/16 0801 Filed Weights   06/18/16 0031 06/18/16 0838  Weight: 74.8 kg (165 lb) 71.9 kg (158 lb 8 oz)    Examination:  General exam: Appears calm and comfortable - eyes jaundiced Respiratory system: Clear to auscultation. Respiratory effort normal. Cardiovascular system: S1 & S2 heard, RRR. No JVD, murmurs, rubs, gallops or clicks. No pedal edema. Gastrointestinal system: Abdomen is nondistended, soft and nontender. No organomegaly or masses felt. Sluggish bowel sounds heard. Central nervous system: Alert and oriented. No focal neurological deficits.     Data Reviewed: I have personally reviewed following labs and imaging studies  CBC:  Recent Labs Lab  06/17/16 2312 06/19/16 0458 06/20/16 0440  WBC 16.8* 10.0 8.3  NEUTROABS 12.9*  --   --   HGB 10.9* 9.8* 9.3*  HCT 31.4* 29.2* 27.0*  MCV 76.6* 76.4* 76.7*  PLT 296 274 [id number]   Basic Metabolic Panel:  Recent Labs Lab 06/17/16 2312 06/19/16 0458 06/20/16 0440  NA 130* 135 135  K 3.5 3.0* 4.1  CL 90* 98* 102  CO2 25 26 24   GLUCOSE 432* 183* 262*  BUN 30* 24* 28*  CREATININE 1.48* 1.70* 2.13*  CALCIUM 9.3 8.6* 8.5*  MG  --  2.0 2.0   GFR: Estimated Creatinine Clearance: 22.2 mL/min (by C-G formula based on SCr of 2.13 mg/dL  (H)). Liver Function Tests:  Recent Labs Lab 06/17/16 2312 06/19/16 0458 06/20/16 0440  AST 179* 126* 110*  ALT 152* 109* 93*  ALKPHOS 623* 457* 397*  BILITOT 11.0* 9.5* 9.1*  PROT 8.3* 6.9 7.1  ALBUMIN 3.1* 2.5* 2.4*    Recent Labs Lab 06/18/16 0253  LIPASE 18    Recent Labs Lab 06/19/16 0907  AMMONIA 41*   Coagulation Profile:  Recent Labs Lab 06/17/16 2312 06/19/16 0458  INR 1.03 1.13   Cardiac Enzymes: No results for input(s): CKTOTAL, CKMB, CKMBINDEX, TROPONINI in the last 168 hours. BNP (last 3 results) No results for input(s): PROBNP in the last 8760 hours. HbA1C: No results for input(s): HGBA1C in the last 72 hours. CBG:  Recent Labs Lab 06/19/16 0728 06/19/16 1141 06/19/16 1635 06/19/16 2042 06/20/16 0729  GLUCAP 172* 244* 234* 242* 265*   Lipid Profile: No results for input(s): CHOL, HDL, LDLCALC, TRIG, CHOLHDL, LDLDIRECT in the last 72 hours. Thyroid Function Tests: No results for input(s): TSH, T4TOTAL, FREET4, T3FREE, THYROIDAB in the last 72 hours. Anemia Panel: No results for input(s): VITAMINB12, FOLATE, FERRITIN, TIBC, IRON, RETICCTPCT in the last 72 hours. Urine analysis:    Component Value Date/Time   COLORURINE AMBER (A) 06/20/2016 0048   APPEARANCEUR HAZY (A) 06/20/2016 0048   LABSPEC 1.017 06/20/2016 0048   PHURINE 5.0 06/20/2016 0048   GLUCOSEU 150 (A) 06/20/2016 0048   HGBUR SMALL (A) 06/20/2016 0048   BILIRUBINUR SMALL (A) 06/20/2016 0048   KETONESUR NEGATIVE 06/20/2016 0048   PROTEINUR 100 (A) 06/20/2016 0048   UROBILINOGEN 0.2 12/03/2013 2155   NITRITE NEGATIVE 06/20/2016 0048   LEUKOCYTESUR LARGE (A) 06/20/2016 0048     ) Recent Results (from the past 240 hour(s))  Blood Culture (routine x 2)     Status: None (Preliminary result)   Collection Time: 06/17/16 11:12 PM  Result Value Ref Range Status   Specimen Description BLOOD RIGHT ANTECUBITAL  Final   Special Requests IN PEDIATRIC BOTTLE 2CC  Final    Culture   Final    NO GROWTH 1 DAY Performed at [facility], [place] [address] [place], [place] [address]    Report Status PENDING  Incomplete  Blood Culture (routine x 2)     Status: None (Preliminary result)   Collection Time: 06/18/16  1:02 AM  Result Value Ref Range Status   Specimen Description BLOOD RIGHT WRIST  Final   Special Requests BOTTLES DRAWN AEROBIC AND ANAEROBIC 5CC  Final   Culture   Final    NO GROWTH 1 DAY Performed at [facility], [place] [address], [place], [place] [address]    Report Status PENDING  Incomplete      Anti-infectives    Start     Dose/Rate Route Frequency Ordered  Stop   06/19/16 0000  levofloxacin (LEVAQUIN) IVPB 750 mg     750 mg 100 mL/hr over 90 Minutes Intravenous Every 48 hours 06/18/16 0547     06/18/16 0600  metroNIDAZOLE (FLAGYL) tablet 500 mg     500 mg Oral Every 8 hours 06/18/16 0536     06/18/16 0015  levofloxacin (LEVAQUIN) IVPB 750 mg     750 mg 100 mL/hr over 90 Minutes Intravenous  Once 06/18/16 0014 06/18/16 0315   06/18/16 0015  metroNIDAZOLE (FLAGYL) IVPB 500 mg     500 mg 100 mL/hr over 60 Minutes Intravenous  Once 06/18/16 0014         Radiology Studies: No results found.      Scheduled Meds: . irbesartan  300 mg Oral Daily   And  . amLODipine  10 mg Oral Daily  . aspirin EC  81 mg Oral Daily  . atorvastatin  20 mg Oral Daily  . ferrous sulfate  325 mg Oral BID WC  . insulin aspart  0-5 Units Subcutaneous QHS  . insulin aspart  0-9 Units Subcutaneous TID WC  . insulin glargine  20 Units Subcutaneous Daily  . levofloxacin (LEVAQUIN) IV  750 mg Intravenous Q48H  . metronidazole  500 mg Intravenous Once  . metroNIDAZOLE  500 mg Oral Q8H  . multivitamin with minerals  1 tablet Oral Daily  . sodium chloride  250 mL Intravenous Once   Continuous Infusions:   LOS: 2 days    Time spent: 25 min    Ray, DO [facility] Pager [phone]  If 7PM-7AM, please contact  night-coverage [email or web address] Password [email or web address] 06/20/2016, 8:01 AM

## 2016-06-20 NOTE — Progress Notes (Signed)
IP PROGRESS NOTE  Subjective:   She was admitted to 06/18/2016 with an episode of "jerking "and labored breathing. The symptoms had resolved when she was seen in the emergency room. In the emergency room the bilirubin was noted to be markedly elevated, she had a low-grade fever, and leukocytosis. She was admitted for further evaluation. [name] reports feeling well. She reports constipation this morning. No rectal bleeding. She had vaginal "spotting "last month. This has resolved. She says that she had a negative colonoscopy approximately 6 years ago. She was not seen for the second opinion appointment at [facility] secondary to her insurance coverage.  Objective: Vital signs in last 24 hours: Blood pressure (!) 149/65, pulse 95, temperature 98.7 F (37.1 C), temperature source Oral, resp. rate 16, height 5\' 7"  (1.702 m), weight 158 lb 8 oz (71.9 kg), SpO2 100 %.  Intake/Output from previous day: No intake/output data recorded.  Physical Exam:  Lungs: Clear bilaterally Cardiac: Regular rate and rhythm Abdomen: No hepatomegaly, nontender, soft Extremities: No leg edema     Lab Results:  Recent Labs  06/19/16 0458 06/20/16 0440  WBC 10.0 8.3  HGB 9.8* 9.3*  HCT 29.2* 27.0*  PLT 274 276    BMET  Recent Labs  06/19/16 0458 06/20/16 0440  NA 135 135  K 3.0* 4.1  CL 98* 102  CO2 26 24  GLUCOSE 183* 262*  BUN 24* 28*  CREATININE 1.70* 2.13*  CALCIUM 8.6* 8.5*    Studies/Results: No results found.  Medications: I have reviewed the patient's current medications.  Assessment/Plan:  1. Metastatic Adenocarcinoma ? CT/MRI abdomen with omental/peritoneal nodules and thickening, portal adenopathy, single liver lesion, gallbladder wall thickening ? ERCP with CBD obstruction  2. 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   Microcytic anemia-Could be anemia of chronic disease versus iron deficiency anemia  4.   Diabetes  5.  Anorexia/wt.  Loss  6.  Admission 06/17/2016 with a fever and persistent elevation of the bilirubin-no recurrent fever on antibiotics, fever likely secondary to a liver abscess/cholangitis versus "tumor "fever  [name] has metastatic adenocarcinoma, most likely from a primary gallbladder or biliary tract source. The bilirubin remains markedly elevated despite placement of a bile duct stent. She may need to undergo additional decompression of the biliary tree via interventional radiology.  She has microcytic anemia of unclear etiology. This may be related to chronic disease, but she could have a primary tumor in the gastrointestinal tract.  I do not recommend systemic chemotherapy unless the hyperbilirubinemia improves.  Recommendations: 1. Check stool Hemoccult 2. Follow-up of the [place] as scheduled 06/27/2016 3. Please call Oncology as needed     LOS: 2 days   Evelyn Coder, MD   06/20/2016, 1:17 PM

## 2016-06-20 NOTE — Progress Notes (Signed)
[facility] Progress Note  Subjective: Patient without complaint, tolerating diet  Objective: Vital signs in last 24 hours: Temp:  [98.4 F (36.9 Fisher)-98.7 F (37.1 Fisher)] 98.7 F (37.1 Fisher) (02/09 0453) Pulse Rate:  [90-95] 95 (02/09 0453) Resp:  [16-18] 16 (02/09 0453) BP: (116-149)/(64-68) 149/65 (02/09 0953) SpO2:  [99 %-100 %] 100 % (02/09 0453) Weight change:    PE: Unchanged  Lab Results: Results for orders placed or performed during the hospital encounter of 06/17/16 (from the past 24 hour(s))  Glucose, capillary     Status: Abnormal   Collection Time: 06/19/16 11:41 AM  Result Value Ref Range   Glucose-Capillary 244 (H) 65 - 99 mg/dL  Glucose, capillary     Status: Abnormal   Collection Time: 06/19/16  4:35 PM  Result Value Ref Range   Glucose-Capillary 234 (H) 65 - 99 mg/dL  Glucose, capillary     Status: Abnormal   Collection Time: 06/19/16  8:42 PM  Result Value Ref Range   Glucose-Capillary 242 (H) 65 - 99 mg/dL  Urinalysis, Routine w reflex microscopic     Status: Abnormal   Collection Time: 06/20/16 12:48 AM  Result Value Ref Range   Color, Urine AMBER (A) YELLOW   APPearance HAZY (A) CLEAR   Specific Gravity, Urine 1.017 1.005 - 1.030   pH 5.0 5.0 - 8.0   Glucose, UA 150 (A) NEGATIVE mg/dL   Hgb urine dipstick SMALL (A) NEGATIVE   Bilirubin Urine SMALL (A) NEGATIVE   Ketones, ur NEGATIVE NEGATIVE mg/dL   Protein, ur 100 (A) NEGATIVE mg/dL   Nitrite NEGATIVE NEGATIVE   Leukocytes, UA LARGE (A) NEGATIVE   RBC / HPF 0-5 0 - 5 RBC/hpf   WBC, UA TOO NUMEROUS TO COUNT 0 - 5 WBC/hpf   Bacteria, UA MANY (A) NONE SEEN   Squamous Epithelial / LPF 6-30 (A) NONE SEEN   Trans Epithel, UA 1   Magnesium     Status: None   Collection Time: 06/20/16  4:40 AM  Result Value Ref Range   Magnesium 2.0 1.7 - 2.4 mg/dL  CBC     Status: Abnormal   Collection Time: 06/20/16  4:40 AM  Result Value Ref Range   WBC 8.3 4.0 - 10.5 K/uL   RBC 3.52 (L) 3.87 - 5.11  MIL/uL   Hemoglobin 9.3 (L) 12.0 - 15.0 g/dL   HCT 27.0 (L) 36.0 - 46.0 %   MCV 76.7 (L) 78.0 - 100.0 fL   MCH 26.4 26.0 - 34.0 pg   MCHC 34.4 30.0 - 36.0 g/dL   RDW 19.9 (H) 11.5 - 15.5 %   Platelets 276 150 - 400 K/uL  Comprehensive metabolic panel     Status: Abnormal   Collection Time: 06/20/16  4:40 AM  Result Value Ref Range   Sodium 135 135 - 145 mmol/L   Potassium 4.1 3.5 - 5.1 mmol/L   Chloride 102 101 - 111 mmol/L   CO2 24 22 - 32 mmol/L   Glucose, Bld 262 (H) 65 - 99 mg/dL   BUN 28 (H) 6 - 20 mg/dL   Creatinine, Ser 2.13 (H) 0.44 - 1.00 mg/dL   Calcium 8.5 (L) 8.9 - 10.3 mg/dL   Total Protein 7.1 6.5 - 8.1 g/dL   Albumin 2.4 (L) 3.5 - 5.0 g/dL   AST 110 (H) 15 - 41 U/L   ALT 93 (H) 14 - 54 U/L   Alkaline Phosphatase 397 (H) 38 - 126 U/L   Total  Bilirubin 9.1 (H) 0.3 - 1.2 mg/dL   GFR calc non Af Amer 22 (L) >60 mL/min   GFR calc Af Amer 25 (L) >60 mL/min   Anion gap 9 5 - 15  Glucose, capillary     Status: Abnormal   Collection Time: 06/20/16  7:29 AM  Result Value Ref Range   Glucose-Capillary 265 (H) 65 - 99 mg/dL    Studies/Results: No results found.    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 Previous biliary stent appears to be in place and patent with persistent hyperbilirubinemia in the 8-10 range.  Plan: Appears to be no acute need for ERCP or percutaneous aspiration or drainage, given her clinical improvement on antibiotics. At minimum should probably have follow-up CT scan in approximately a week. If ready clinically okay for discharge this weekend. Follow-up recommendation would be with Dr. Paulita Fujita as scheduled or in 2 weeks and with oncology. We'll continue to follow while in the hospital.    [name] 06/20/2016, 10:13 AM  Pager [phone] If no answer or after 5 PM call [phone]

## 2016-06-21 DIAGNOSIS — N179 Acute kidney failure, unspecified: Secondary | ICD-10-CM

## 2016-06-21 DIAGNOSIS — E1165 Type 2 diabetes mellitus with hyperglycemia: Secondary | ICD-10-CM

## 2016-06-21 DIAGNOSIS — C799 Secondary malignant neoplasm of unspecified site: Secondary | ICD-10-CM

## 2016-06-21 DIAGNOSIS — A419 Sepsis, unspecified organism: Principal | ICD-10-CM

## 2016-06-21 DIAGNOSIS — Z794 Long term (current) use of insulin: Secondary | ICD-10-CM

## 2016-06-21 LAB — GLUCOSE, CAPILLARY
GLUCOSE-CAPILLARY: 144 mg/dL — AB (ref 65–99)
Glucose-Capillary: 230 mg/dL — ABNORMAL HIGH (ref 65–99)

## 2016-06-21 LAB — CBC
HCT: 27.9 % — ABNORMAL LOW (ref 36.0–46.0)
HEMOGLOBIN: 9.7 g/dL — AB (ref 12.0–15.0)
MCH: 26.5 pg (ref 26.0–34.0)
MCHC: 34.8 g/dL (ref 30.0–36.0)
MCV: 76.2 fL — ABNORMAL LOW (ref 78.0–100.0)
PLATELETS: 295 10*3/uL (ref 150–400)
RBC: 3.66 MIL/uL — AB (ref 3.87–5.11)
RDW: 20.1 % — ABNORMAL HIGH (ref 11.5–15.5)
WBC: 8.3 10*3/uL (ref 4.0–10.5)

## 2016-06-21 LAB — COMPREHENSIVE METABOLIC PANEL
ALBUMIN: 2.4 g/dL — AB (ref 3.5–5.0)
ALT: 79 U/L — AB (ref 14–54)
ANION GAP: 7 (ref 5–15)
AST: 102 U/L — ABNORMAL HIGH (ref 15–41)
Alkaline Phosphatase: 391 U/L — ABNORMAL HIGH (ref 38–126)
BUN: 20 mg/dL (ref 6–20)
CHLORIDE: 104 mmol/L (ref 101–111)
CO2: 23 mmol/L (ref 22–32)
CREATININE: 1.52 mg/dL — AB (ref 0.44–1.00)
Calcium: 8.4 mg/dL — ABNORMAL LOW (ref 8.9–10.3)
GFR calc non Af Amer: 32 mL/min — ABNORMAL LOW (ref >60)
GFR, EST AFRICAN AMERICAN: 38 mL/min — AB (ref >60)
GLUCOSE: 141 mg/dL — AB (ref 65–99)
Potassium: 3.6 mmol/L (ref 3.5–5.1)
SODIUM: 134 mmol/L — AB (ref 135–145)
Total Bilirubin: 8.2 mg/dL — ABNORMAL HIGH (ref 0.3–1.2)
Total Protein: 6.6 g/dL (ref 6.5–8.1)

## 2016-06-21 LAB — MAGNESIUM: MAGNESIUM: 1.7 mg/dL (ref 1.7–2.4)

## 2016-06-21 LAB — URINE CULTURE

## 2016-06-21 MED ORDER — ZOLPIDEM TARTRATE 5 MG PO TABS
2.5000 mg | ORAL_TABLET | Freq: Once | ORAL | Status: AC | PRN
  Administered 2016-06-21: 2.5 mg via ORAL
  Filled 2016-06-21: qty 1

## 2016-06-21 MED ORDER — LACTULOSE 10 GM/15ML PO SOLN: 30.0000 g | Freq: Two times a day (BID) | ORAL | 0 refills | Status: DC | PRN

## 2016-06-21 MED ORDER — METRONIDAZOLE 500 MG PO TABS: 500.0000 mg | ORAL_TABLET | Freq: Three times a day (TID) | ORAL | 0 refills | Status: DC

## 2016-06-21 MED ORDER — AMLODIPINE BESYLATE 10 MG PO TABS: 10.0000 mg | ORAL_TABLET | Freq: Every day | ORAL | 0 refills | Status: AC

## 2016-06-21 MED ORDER — LEVOFLOXACIN IN D5W 750 MG/150ML IV SOLN
750.0000 mg | INTRAVENOUS | Status: DC
  Filled 2016-06-21: qty 150

## 2016-06-21 NOTE — Care Management Important Message (Signed)
Important Message  Patient Details  Name: [name] MRN: [id number] Date of Birth: [birth year]   Medicare Important Message Given:  Yes    Erenest Rasher, RN 06/21/2016, 12:17 PM

## 2016-06-21 NOTE — Discharge Summary (Signed)
Physician Discharge Summary  [name] ZZ:[id number] DOB: [birth year] DOA: 06/17/2016  PCP: Lilian Coma, MD  Admit date: 06/17/2016 Discharge date: 06/21/2016  Time spent: 45 minutes  Recommendations for Outpatient Follow-up:  Patient will be discharged to home.  Patient will need to follow up with primary care provider within one week of discharge, repeat CMP.  Follow up with Dr. Benay Spice, oncology, in one week.  Follow up with Dr. Paulita Fujita, gastroenterology, in 2 weeks.  Patient should continue medications as prescribed.  Patient should follow a heart healthy/ carb modified diet.   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 Jennette Kettle [name] is a 76 y.o. 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  The cancer was diagnosed during a hospital stay last month from 1/11 to 1/19 when she was admitted for LFT elevation, loss of appetite, whiteish stools.  During that admission (on 1/17) a biliary stent was placed by Dr. Paulita Fujita in her CBD for obstructive jaundice and brush biopsy was taken of the neoplasm (which would come back later as adenocarcinoma favoring a GI primary).  Since that time she has had alternating brownish stools with white stools but has continued to feel somewhat poor.  Today the patient presents to the ED after an episode of "jerking" with increased labored breathing.  Patient was fully alert and responsive  during this episode.  Symptoms have resolved.  Family is also concerned that she is hyperglycemic today.  Patient states she "feels fine".  Hospital Course:  Obstructive Jaundice due to metastatic adenocarcinoma -Gastroenterology consulted and appreciate -placed on antibiotics and improving -patient has biliary stent which appeared to be in place (per GI's noted) -continues to have hyperbilirubinemia, which is mildly improving -Repeat CMP in one week  Sepsis secondary to possible abscess or low grade cholangitis -sepsis morphology improved -Continue levaquin and flagyl for additional 10 days -Follow up with gastroenterology in 2 weeks  Cholangiocarcinoma -Follow-up with Dr. Benay Spice in one week -Patient will not be a candidate for chemotherapy at her bilirubin improves  Diabetes mellitus, type II  -Continue home regimen upon discharge  Hypokalemia -HCTZ discontinued -Resolved, continue to monitor as an outpatient  Essential hypertension -continue amlodipine -ARB and HCTZ held  Acute kidney injury -Creatinine improving, currently 1.52 -Repeat CMP in 1 week  Constipation -Continue lactulose as needed  Chronic microcytic anemia -Baseline hemoglobin 9-10, currently 9.7 -Continue iron supplementation  Hyperlipidemia -Statin held due to elevated LFTs and bilirubin -Patient will need to speak to her primary care physician about restarting this medication   Procedures: None  Consultations: Gastroenterology   Discharge Exam: Vitals:   06/20/16 2136 06/21/16 0516  BP: (!) 148/65 (!) 154/70  Pulse: 86 91  Resp: 18 18  Temp: 98.2 F (36.8 C) 98.7 F (37.1 C)     General: Well developed, well nourished, NAD, appears stated age  HEENT: NCAT, mucous membranes moist. Scleral icterus  Cardiovascular: S1 S2 auscultated, no rubs, murmurs or gallops. Regular rate and rhythm.  Respiratory: Clear to auscultation bilaterally   Abdomen: Soft,  nontender, nondistended, +  bowel sounds  Extremities: warm dry without cyanosis clubbing or edema  Neuro: AAOx3, nonfocal  Psych: Normal affect and demeanor, pleasant  Discharge Instructions Discharge Instructions    Discharge instructions    Complete by:  As directed    Patient will be discharged to home.  Patient will need to follow up with primary care provider within one week of discharge, repeat CMP.  Follow up with Dr. Benay Spice, oncology, in one week.  Follow up with Dr. Paulita Fujita, gastroenterology, in 2 weeks.  Patient should continue medications as prescribed.  Patient should follow a heart healthy/ carb modified diet.     Current Discharge Medication List    START taking these medications   Details  amLODipine (NORVASC) 10 MG tablet Take 1 tablet (10 mg total) by mouth daily. Qty: 30 tablet, Refills: 0    lactulose (CHRONULAC) 10 GM/15ML solution Take 45 mLs (30 g total) by mouth 2 (two) times daily as needed for mild constipation. Qty: 240 mL, Refills: 0    metroNIDAZOLE (FLAGYL) 500 MG tablet Take 1 tablet (500 mg total) by mouth every 8 (eight) hours. Qty: 30 tablet, Refills: 0      CONTINUE these medications which have NOT CHANGED   Details  aspirin EC 81 MG tablet Take 81 mg by mouth daily.    cholecalciferol (VITAMIN D) 1000 UNITS tablet Take 1,000 Units by mouth daily.     ferrous sulfate 325 (65 FE) MG EC tablet Take 1 tablet (325 mg total) by mouth 2 (two) times daily with a meal. Refills: 3   Associated Diagnoses: Carcinoma of unknown primary (HCC)    HUMALOG MIX 50/50 KWIKPEN (50-50) 100 UNIT/ML Kwikpen Inject 10-40 Units into the skin 2 (two) times daily. Inject 40 units under the skin before breakfast and 10 units under the skin before evening meal. Refills: 11    Multiple Vitamin (MULTIVITAMIN WITH MINERALS) TABS tablet Take 1 tablet by mouth daily.    oxyCODONE (OXY IR/ROXICODONE) 5 MG immediate release tablet Take 0.5-1 tablets (2.5-5 mg total) by mouth every 6 (six) hours as  needed for severe pain. Qty: 40 tablet, Refills: 0   Associated Diagnoses: Carcinoma of unknown primary ([place])    feeding supplement, ENSURE ENLIVE, (ENSURE ENLIVE) LIQD Take 237 mLs by mouth 2 (two) times daily between meals. Qty: 237 mL, Refills: 12      STOP taking these medications     atorvastatin (LIPITOR) 20 MG tablet      Olmesartan-Amlodipine-HCTZ 40-10-12.5 MG TABS        Allergies  Allergen Reactions  . Rosiglitazone Maleate Other (See Comments)    couldn't sleep  . Metformin Diarrhea  . Penicillins Hives and Rash    Has patient had a PCN reaction causing immediate rash, facial/tongue/throat swelling, SOB or lightheadedness with hypotension: yes Has patient had a PCN reaction causing severe rash involving mucus membranes or skin necrosis: no Has patient had a PCN reaction that required hospitalization:no Has patient had a PCN reaction occurring within the last 10 years: no If all of the above answers are "NO", then may proceed with Cephalosporin use.  . Pioglitazone Other (See Comments)    Joint pain    Follow-up Information    WOLTERS,SHARON A, MD. Schedule an appointment as soon as possible for a visit in 1 week(s).   Specialty:  Family Medicine Why:  Hospital follow up Contact information: [address] [place] [address] [phone]  [name], MD. Schedule an appointment as soon as possible for a visit in 1 week(s).   Specialty:  Oncology Why:  Hospital follow up Contact information: [place] [id number]        Landry Dyke, MD. Schedule an appointment as soon as possible for a visit in 2 week(s).   Specialty:  Gastroenterology Why:  Hospital follow up Contact information: [id number] [place] [address] [phone]            The results of significant diagnostics from this hospitalization (including imaging, microbiology, ancillary and  laboratory) are listed below for reference.    Significant Diagnostic Studies: Ct Abdomen Pelvis Wo Contrast  Result Date: 05/22/2016 CLINICAL DATA:  Vaginal bleeding for 1 week EXAM: CT ABDOMEN AND PELVIS WITHOUT CONTRAST TECHNIQUE: Multidetector CT imaging of the abdomen and pelvis was performed following the standard protocol without IV contrast. COMPARISON:  12/09/2012 FINDINGS: Lower chest: Lung bases demonstrate no acute consolidation or pleural effusion. Normal heart size. Hepatobiliary: No focal hepatic abnormality. No biliary dilatation. Calcified gallstone. Possible wall thickening. Ill-defined soft tissue expansion of of the porta hepatis appears different from comparison CT. Pancreas: No peripancreatic inflammation. No dilatation of pancreas duct. Spleen: Normal in size without focal abnormality. Adrenals/Urinary Tract: Adrenal glands are within normal limits. Punctate calcification upper pole right kidney. Punctate nonobstructing stone or mild vascular calcification mid left kidney. Bladder unremarkable. Stomach/Bowel: Stomach nonenlarged. Small hiatal hernia. No dilated small bowel. No colon wall thickening. Vascular/Lymphatic: Atherosclerotic calcifications. No aneurysm. Enlarged porta hepatis lymph nodes up to 11 mm. Mild retroperitoneal adenopathy unchanged. Reproductive: Calcified uterine fibroid. Mild right adnexal enlargement compared to prior Other: No free air or free fluid. Interim finding of soft tissue infiltration of the anterior pelvic fat. Multiple soft tissue nodules within the lower omentum and within the mesenteric fat. Musculoskeletal: No acute or suspicious bone lesions. IMPRESSION: 1. Interim finding of infiltrative soft tissue density within the anterior lower abdominal/upper pelvic fat with multiple omental, mesenteric and intraperitoneal nodules visualized. Findings would be concerning for metastatic disease. 2. Calcified uterine fibroids. Mild soft tissue enlargement of the  right adnexa. Could consider correlation with pelvic ultrasound. 3. Ill-defined possible soft tissue expansion at the porta hepatis. Follow-up CT examination with contrast would be helpful for further evaluation. Calcified gallstone with possible wall thickening, could correlate with ultrasound. Mild enlarged lymph nodes at the porta hepatis. 4. Nonobstructing stone right kidney. Punctate stone or mild vascular calcification mid left kidney. Electronically Signed   By: Donavan Foil M.D.   On: 05/22/2016 22:45   Dg Chest 2 View  Result Date: 06/17/2016 CLINICAL DATA:  Gallbladder and bile duct cancer, rapid breathing EXAM: CHEST  2 VIEW COMPARISON:  03/15/2013 FINDINGS: The heart size and mediastinal contours are within normal limits. Both lungs are clear. Degenerative changes of the spine. Atherosclerosis of the aorta. Partially visualized stent in the right upper quadrant. IMPRESSION: No active cardiopulmonary disease. Electronically Signed   By: Donavan Foil M.D.   On: 06/17/2016 22:49   Ct Chest Wo Contrast  Result Date: 05/27/2016 CLINICAL DATA:  Omental metastatic disease, possible metastatic disease within chest EXAM: CT CHEST WITHOUT CONTRAST TECHNIQUE: Multidetector CT imaging of the chest was performed following the standard protocol without IV contrast. COMPARISON:  Images of the lung bases 05/22/2014 FINDINGS: Cardiovascular: Cardiac size within normal limits. No pericardial effusion. Atherosclerotic calcifications of thoracic aorta. Mediastinum/Nodes: Precarinal lymph node Measures 1cm short-axis. No definite hilar adenopathy  is noted on this unenhanced scan. Central airways are patent. Lungs/Pleura: Images of the lung parenchyma shows no pulmonary edema. No focal consolidation. No pulmonary nodules are noted. There is small right pleural effusion and right base posterior atelectasis. No bronchiectasis or emphysematous changes. No fibrotic changes are noted. Upper Abdomen: Again noted irregular  gallbladder fundus and some thickening of the wall of the gallbladder. Neoplastic process cannot be excluded. Probable lymph node anterior to porta hepatis measures 1.2 cm pathologic suspicious for adenopathy. No adrenal gland mass is noted. Musculoskeletal: No destructive bony lesions are noted. Sagittal images of the spine shows degenerative changes thoracic spine. There is left axillary lymph node measures 1.2 cm short-axis. Second left axillary lymph node measures 1.2 cm short-axis. IMPRESSION: 1. There is borderline precarinal lymph node measures 1 cm short-axis. No hilar adenopathy is noted. Mild enlarged left axillary lymph nodes. Pathologic adenopathy cannot be excluded. Clinical correlation is necessary. 2. No infiltrate or pulmonary edema. No pulmonary nodules are noted. 3. Small right pleural effusion with right base posterior atelectasis. 4. No destructive bony lesions are noted. 5. Again noted irregular thickening of gallbladder wall and irregular appearance of gallbladder fundus. Neoplastic process cannot be excluded. Clinical correlation is necessary. 6. No adrenal gland mass. Again noted probable pathologic adenopathy in porta hepatic region. Electronically Signed   By: Lahoma Crocker M.D.   On: 05/27/2016 16:13   US Abdomen Complete  Result Date: 05/22/2016 CLINICAL DATA:  Elevated LFTs, UTI EXAM: ABDOMEN ULTRASOUND COMPLETE COMPARISON:  12/03/2015 FINDINGS: Gallbladder: Large shadowing stone in the gallbladder measuring 2.6 cm. Mild wall thickening at 4.3 mm. Negative sonographic Murphy's. Sludge is also present within the gallbladder. Common bile duct: Diameter: Normal at 3.5 mm. Liver: No focal lesion identified. Within normal limits in parenchymal echogenicity. IVC: No abnormality visualized. Pancreas: Visualized portion unremarkable. Spleen: Size and appearance within normal limits. Right Kidney: Length: 8.9 cm. Echogenicity within normal limits. No mass or hydronephrosis visualized. Lower pole  obscured by bowel gas Left Kidney: Length: 10 cm. Echogenicity within normal limits. No mass or hydronephrosis visualized. Abdominal aorta: No aneurysm visualized. Other findings: None. IMPRESSION: 1. Hepatic echogenicity is within normal limits. No biliary dilatation 2. Large shadowing stone within the gallbladder with sludge also present. Mild wall thickening of the gallbladder is non specific. Negative sonographic Murphy's. Electronically Signed   By: Donavan Foil M.D.   On: 05/22/2016 20:08   Ct Abdomen Pelvis W Contrast  Result Date: 06/18/2016 CLINICAL DATA:  Fever. Recent admission for jaundice secondary to metastatic cholangiocarcinoma. Recent ERCP and stent placement. Elevated lactic acid. EXAM: CT ABDOMEN AND PELVIS WITH CONTRAST TECHNIQUE: Multidetector CT imaging of the abdomen and pelvis was performed using the standard protocol following bolus administration of intravenous contrast. CONTRAST:  64mL ISOVUE-300 IOPAMIDOL (ISOVUE-300) INJECTION 61% COMPARISON:  MRI abdomen 05/23/2016. CT abdomen and pelvis 05/22/2016. FINDINGS: Lower chest: Mild dependent changes in the lung bases. Coronary artery calcifications. Residual fluid in the esophagus without distention may indicate reflux or dysmotility. Hepatobiliary: Again demonstrated, there is a somewhat poorly defined hypodense 3 x 4.1 cm diameter mass in the medial segment left lobe of the liver adjacent to the gallbladder fossa. The lesion appears larger than on prior study and increasing low-attenuation since previous study suggest possible central necrosis or could indicate developing abscess/infection given the history of fever. Mild periportal edema. Portal veins appear patent. Mild bile duct dilatation. Biliary wall stent in place. Large stone in the gallbladder. Mild gallbladder wall thickening. Pancreas: Unremarkable. No  pancreatic ductal dilatation or surrounding inflammatory changes. Spleen: Normal in size without focal abnormality.  Adrenals/Urinary Tract: No adrenal gland nodules. Punctate stones in the upper pole both kidneys. No hydronephrosis or hydroureter. Bladder wall is not thickened and no bladder filling defects are demonstrated. Stomach/Bowel: Scattered diverticula in the colon. Stool throughout the colon. Colonic wall appears mildly thickened throughout which may indicate colitis. No pericolonic infiltration. Stomach and small bowel are not abnormally distended. Vascular/Lymphatic: Aortic atherosclerosis. No enlarged abdominal or pelvic lymph nodes. Reproductive: Calcified uterine fibroids. No abnormal adnexal masses. Small amount of free fluid in the pelvis is nonspecific but likely reactive. Small malignant ascites not excluded. Other: There is diffuse omental stranding and nodularity with scattered nodule are deposits throughout the peritoneal likely metastatic. No free air in the abdomen. Musculoskeletal: Degenerative changes in the spine and hips. No destructive bone lesions. IMPRESSION: Low-attenuation mass in segment 4 of the liver demonstrates increased size and decreased attenuation since previous study, suggesting central necrosis or developing abscess. Mild bile duct dilatation with biliary stent in place. Cholelithiasis with gallbladder wall thickening. Colon wall appears mildly thickened throughout, possibly indicating colitis. No evidence of bowel obstruction. Diffuse omental caking and peritoneal nodularity likely representing peritoneal metastasis. Aortic atherosclerosis. Punctate size nonobstructing stones in the kidneys. Electronically Signed   By: Lucienne Capers M.D.   On: 06/18/2016 02:11   Mr Abdomen Mrcp Wo Contrast  Result Date: 05/23/2016 CLINICAL DATA:  Elevated liver function tests. CT demonstrating gallstones and possible soft tissue expansion in the porta hepatis. EXAM: MRI ABDOMEN WITHOUT CONTRAST  (INCLUDING MRCP) TECHNIQUE: Multiplanar multisequence MR imaging of the abdomen was performed.  Heavily T2-weighted images of the biliary and pancreatic ducts were obtained, and three-dimensional MRCP images were rendered by post processing. COMPARISON:  CT of 1 day prior.  Ultrasound of 05/23/2015 FINDINGS: Lower chest: Normal heart size without pericardial or pleural effusion. Tiny hiatal hernia. Hepatobiliary: 2.4 cm gallstone. Suggestion of gallbladder wall thickening at the fundus, including on image 31/ series 10. Contiguous 2.5 cm pericholecystic hepatic lesion straddling the right and left lobes. This is moderately T2 hyperintense, irregular, with restricted diffusion. Example image 21/series 3. Intrahepatic ducts either borderline prominent, including on image 21/ series 3. There is a suggestion of T2 hyperintensity along the portal triads, including on image 19/ series 3. This is nonspecific and could represent edema. The common duct becomes narrowed in the region of the porta hepatis. An ill-defined soft tissue signal measures 4.6 x 2.5 cm on image 23/ series 3. The more distal common duct is normal in caliber. This area of narrowed common duct is readily apparent on image 9/ series 400, image 65/series 4. Pancreas:  Normal, without mass or ductal dilatation. Spleen:  Normal in size, without focal abnormality. Adrenals/Urinary Tract: Normal adrenal glands. Normal kidneys, without hydronephrosis. Stomach/Bowel: Normal remainder of the stomach. Normal caliber of large and small bowel loops. Vascular/Lymphatic: Normal caliber of the aorta and branch vessels. No other abdominal adenopathy. Other: No ascites. Incompletely imaged omental thickening within the pelvis on image 30/series 10. Musculoskeletal: No acute osseous abnormality. IMPRESSION: 1. Borderline intrahepatic duct dilatation with narrowing of the common duct in the porta hepatis at the site of a soft tissue signal lesion which is likely infiltrative adenopathy. The more distal common duct is normal in caliber. 2. Gallstone with nonspecific  gallbladder fundal wall thickening. Adjacent suspicious liver lesion. Considerations include metastatic disease from the patient's presumed primary versus direct extension of gallbladder carcinoma into the adjacent liver.  Consider sampling of the liver lesion for staging. 3. Suggestion of extension of edema along the portal triads, nonspecific. Contrast-enhanced hepatic protocol CT may be informative. 4. Incompletely imaged omental thickening/nodularity as on CT. This is highly suspicious for peritoneal metastasis. Potential primaries include ovarian, gallbladder, or an otherwise unknown primary. Electronically Signed   By: Abigail Miyamoto M.D.   On: 05/23/2016 21:58   Mr 3d Recon At Scanner  Result Date: 05/23/2016 CLINICAL DATA:  Elevated liver function tests. CT demonstrating gallstones and possible soft tissue expansion in the porta hepatis. EXAM: MRI ABDOMEN WITHOUT CONTRAST  (INCLUDING MRCP) TECHNIQUE: Multiplanar multisequence MR imaging of the abdomen was performed. Heavily T2-weighted images of the biliary and pancreatic ducts were obtained, and three-dimensional MRCP images were rendered by post processing. COMPARISON:  CT of 1 day prior.  Ultrasound of 05/23/2015 FINDINGS: Lower chest: Normal heart size without pericardial or pleural effusion. Tiny hiatal hernia. Hepatobiliary: 2.4 cm gallstone. Suggestion of gallbladder wall thickening at the fundus, including on image 31/ series 10. Contiguous 2.5 cm pericholecystic hepatic lesion straddling the right and left lobes. This is moderately T2 hyperintense, irregular, with restricted diffusion. Example image 21/series 3. Intrahepatic ducts either borderline prominent, including on image 21/ series 3. There is a suggestion of T2 hyperintensity along the portal triads, including on image 19/ series 3. This is nonspecific and could represent edema. The common duct becomes narrowed in the region of the porta hepatis. An ill-defined soft tissue signal measures  4.6 x 2.5 cm on image 23/ series 3. The more distal common duct is normal in caliber. This area of narrowed common duct is readily apparent on image 9/ series 400, image 65/series 4. Pancreas:  Normal, without mass or ductal dilatation. Spleen:  Normal in size, without focal abnormality. Adrenals/Urinary Tract: Normal adrenal glands. Normal kidneys, without hydronephrosis. Stomach/Bowel: Normal remainder of the stomach. Normal caliber of large and small bowel loops. Vascular/Lymphatic: Normal caliber of the aorta and branch vessels. No other abdominal adenopathy. Other: No ascites. Incompletely imaged omental thickening within the pelvis on image 30/series 10. Musculoskeletal: No acute osseous abnormality. IMPRESSION: 1. Borderline intrahepatic duct dilatation with narrowing of the common duct in the porta hepatis at the site of a soft tissue signal lesion which is likely infiltrative adenopathy. The more distal common duct is normal in caliber. 2. Gallstone with nonspecific gallbladder fundal wall thickening. Adjacent suspicious liver lesion. Considerations include metastatic disease from the patient's presumed primary versus direct extension of gallbladder carcinoma into the adjacent liver. Consider sampling of the liver lesion for staging. 3. Suggestion of extension of edema along the portal triads, nonspecific. Contrast-enhanced hepatic protocol CT may be informative. 4. Incompletely imaged omental thickening/nodularity as on CT. This is highly suspicious for peritoneal metastasis. Potential primaries include ovarian, gallbladder, or an otherwise unknown primary. Electronically Signed   By: Abigail Miyamoto M.D.   On: 05/23/2016 21:58   Ct Biopsy  Result Date: 05/27/2016 CLINICAL DATA:  omental thickening/nodularity as on CT. Thisis highly suspicious for peritoneal metastasis. Potential primariesinclude ovarian, gallbladder, or an otherwise unknown primary EXAM: CT GUIDED CORE BIOPSY OF OMENTUM  ANESTHESIA/SEDATION: Intravenous Fentanyl and Versed were administered as conscious sedation during continuous monitoring of the patient's level of consciousness and physiological / cardiorespiratory status by the radiology RN, with a total moderate sedation time of 10 minutes. PROCEDURE: The procedure risks, benefits, and alternatives were explained to the patient. Questions regarding the procedure were encouraged and answered. The patient understands and consents to the procedure.  Select axial scans through the lower abdomen were obtained. The omental disease was localized and an appropriate skin entry site determined and marked. The operative field was prepped with chlorhexidinein a sterile fashion, and a sterile drape was applied covering the operative field. A sterile gown and sterile gloves were used for the procedure. Local anesthesia was provided with 1% Lidocaine. Under CT fluoroscopic guidance, a 17 gauge trocar needle was advanced to the margin of the lesion. Once needle tip position was confirmed, coaxial 18-gauge core biopsy samples were obtained, submitted in formalin to surgical pathology. The guide needle was removed. Postprocedure scans show no hemorrhage or other apparent complication. The patient tolerated the procedure well. COMPLICATIONS: None immediate FINDINGS: Nodular omental thickening in the left lower abdomen/pelvis. Representative core biopsy samples obtained as above. IMPRESSION: 1. Technically successful core biopsy of omental thickening. Electronically Signed   By: Lucrezia Europe M.D.   On: 05/27/2016 09:13   Dg Ercp With Sphincterotomy  Result Date: 05/28/2016 CLINICAL DATA:  Obstructive jaundice EXAM: ERCP TECHNIQUE: Multiple spot images obtained with the fluoroscopic device and submitted for interpretation post-procedure. FLUOROSCOPY TIME:  Fluoroscopy Time:  6 minutes and 25 seconds Radiation Exposure Index (if provided by the fluoroscopic device): 93.84 mGy Number of Acquired Spot  Images: 9 COMPARISON:  None. FINDINGS: There is narrowing of the common hepatic duct in the porta hepatis. A biliary stent has been placed across the high common hepatic duct stricture hand across the ampulla. IMPRESSION: Successful biliary stent placement for upper common hepatic duct stricture. These images were submitted for radiologic interpretation only. Please see the procedural report for the amount of contrast and the fluoroscopy time utilized. Electronically Signed   By: Marybelle Killings M.D.   On: 05/28/2016 12:15    Microbiology: Recent Results (from the past 240 hour(s))  Blood Culture (routine x 2)     Status: None (Preliminary result)   Collection Time: 06/17/16 11:12 PM  Result Value Ref Range Status   Specimen Description BLOOD RIGHT ANTECUBITAL  Final   Special Requests IN PEDIATRIC BOTTLE 2CC  Final   Culture   Final    NO GROWTH 3 DAYS Performed at [facility], [address], [place], [place] [address]    Report Status PENDING  Incomplete  Blood Culture (routine x 2)     Status: None (Preliminary result)   Collection Time: 06/18/16  1:02 AM  Result Value Ref Range Status   Specimen Description BLOOD RIGHT WRIST  Final   Special Requests BOTTLES DRAWN AEROBIC AND ANAEROBIC 5CC  Final   Culture   Final    NO GROWTH 3 DAYS Performed at [facility], [place] [address] [place], [place] [address]    Report Status PENDING  Incomplete     Labs: Basic Metabolic Panel:  Recent Labs Lab 06/17/16 2312 06/19/16 0458 06/20/16 0440 06/21/16 0535  NA 130* 135 135 134*  K 3.5 3.0* 4.1 3.6  CL 90* 98* 102 104  CO2 25 26 24 23   GLUCOSE 432* 183* 262* 141*  BUN 30* 24* 28* 20  CREATININE 1.48* 1.70* 2.13* 1.52*  CALCIUM 9.3 8.6* 8.5* 8.4*  MG  --  2.0 2.0 1.7   Liver Function Tests:  Recent Labs Lab 06/17/16 2312 06/19/16 0458 06/20/16 0440 06/21/16 0535  AST 179* 126* 110* 102*  ALT 152* 109* 93* 79*  ALKPHOS 623* 457* 397* 391*  BILITOT 11.0* 9.5*  9.1* 8.2*  PROT 8.3* 6.9 7.1 6.6  ALBUMIN  3.1* 2.5* 2.4* 2.4*    Recent Labs Lab 06/18/16 0253  LIPASE 18    Recent Labs Lab 06/19/16 0907  AMMONIA 41*   CBC:  Recent Labs Lab 06/17/16 2312 06/19/16 0458 06/20/16 0440 06/21/16 0535  WBC 16.8* 10.0 8.3 8.3  NEUTROABS 12.9*  --   --   --   HGB 10.9* 9.8* 9.3* 9.7*  HCT 31.4* 29.2* 27.0* 27.9*  MCV 76.6* 76.4* 76.7* 76.2*  PLT 296 274 276 295   Cardiac Enzymes: No results for input(s): CKTOTAL, CKMB, CKMBINDEX, TROPONINI in the last 168 hours. BNP: BNP (last 3 results) No results for input(s): BNP in the last 8760 hours.  ProBNP (last 3 results) No results for input(s): PROBNP in the last 8760 hours.  CBG:  Recent Labs Lab 06/20/16 0729 06/20/16 1147 06/20/16 1613 06/20/16 2140 06/21/16 0741  GLUCAP 265* 341* 274* 168* 144*       Signed:  Cristal Ford  [facility] 06/21/2016, 11:41 AM

## 2016-06-21 NOTE — Progress Notes (Signed)
Pharmacy Antibiotic Note  [name] is a 76 y.o. female c/o SOB admitted on 06/17/2016 with intra-abdominal infection.  Pharmacy has been consulted for levaquin dosing.  Day #4 antibiotics  Scr improved  WBC WNL  Plan:  Change levofloxaxin to 750mg  IV q48h  Patient prescribed cephalexin in past, consider change to cephalosporin to avoid toxicity of quinolones, especially in elderly with decreased renal fx  Height: 5\' 7"  (170.2 cm) Weight: 158 lb 8 oz (71.9 kg) IBW/kg (Calculated) : 61.6  Temp (24hrs), Avg:98.5 F (36.9 C), Min:98.2 F (36.8 C), Max:98.7 F (37.1 C)   Recent Labs Lab 06/17/16 2312 06/17/16 2322 06/19/16 0458 06/20/16 0440 06/21/16 0535  WBC 16.8*  --  10.0 8.3 8.3  CREATININE 1.48*  --  1.70* 2.13* 1.52*  LATICACIDVEN  --  2.72*  --   --   --     Estimated Creatinine Clearance: 31.1 mL/min (by C-G formula based on SCr of 1.52 mg/dL (H)).    Allergies  Allergen Reactions  . Rosiglitazone Maleate Other (See Comments)    couldn't sleep  . Metformin Diarrhea  . Penicillins Hives and Rash    Has patient had a PCN reaction causing immediate rash, facial/tongue/throat swelling, SOB or lightheadedness with hypotension: yes Has patient had a PCN reaction causing severe rash involving mucus membranes or skin necrosis: no Has patient had a PCN reaction that required hospitalization:no Has patient had a PCN reaction occurring within the last 10 years: no If all of the above answers are "NO", then may proceed with Cephalosporin use.  . Pioglitazone Other (See Comments)    Joint pain     Antimicrobials this admission: 2/7 levaquin >>  2/7 flagyl >>   Dose adjustments this admission:   Microbiology results:  2/6 BCx: NGTD 2/7 BCx: NGTD 2/7 flu: neg 2/9 Urine ordered: pending (collected 2-days after start abx)   Thank you for allowing pharmacy to be a part of this patient's care.  Gretta Arab PharmD, BCPS Pager [phone] 06/21/2016 9:24  AM

## 2016-06-21 NOTE — Progress Notes (Signed)
[facility] Progress Note  Subjective: The patient is doing well today. She is up and eating breakfast. She has no complaints of abdominal pain. She was seen by oncology and she has a follow-up appointment with them next week for her metastatic adenocarcinoma.  Objective: Vital signs in last 24 hours: Temp:  [98.2 F (36.8 C)-98.7 F (37.1 C)] 98.7 F (37.1 C) (02/10 0516) Pulse Rate:  [86-96] 91 (02/10 0516) Resp:  [18] 18 (02/10 0516) BP: (146-154)/(65-70) 154/70 (02/10 0516) SpO2:  [99 %-100 %] 99 % (02/10 0516) Weight change:    PE:  No distress  Heart regular rhythm  Lungs clear  Abdomen soft and nontender  Lab Results: Results for orders placed or performed during the hospital encounter of 06/17/16 (from the past 24 hour(s))  Glucose, capillary     Status: Abnormal   Collection Time: 06/20/16 11:47 AM  Result Value Ref Range   Glucose-Capillary 341 (H) 65 - 99 mg/dL  Glucose, capillary     Status: Abnormal   Collection Time: 06/20/16  4:13 PM  Result Value Ref Range   Glucose-Capillary 274 (H) 65 - 99 mg/dL  Glucose, capillary     Status: Abnormal   Collection Time: 06/20/16  9:40 PM  Result Value Ref Range   Glucose-Capillary 168 (H) 65 - 99 mg/dL  Magnesium     Status: None   Collection Time: 06/21/16  5:35 AM  Result Value Ref Range   Magnesium 1.7 1.7 - 2.4 mg/dL  CBC     Status: Abnormal   Collection Time: 06/21/16  5:35 AM  Result Value Ref Range   WBC 8.3 4.0 - 10.5 K/uL   RBC 3.66 (L) 3.87 - 5.11 MIL/uL   Hemoglobin 9.7 (L) 12.0 - 15.0 g/dL   HCT 27.9 (L) 36.0 - 46.0 %   MCV 76.2 (L) 78.0 - 100.0 fL   MCH 26.5 26.0 - 34.0 pg   MCHC 34.8 30.0 - 36.0 g/dL   RDW 20.1 (H) 11.5 - 15.5 %   Platelets 295 150 - 400 K/uL  Comprehensive metabolic panel     Status: Abnormal   Collection Time: 06/21/16  5:35 AM  Result Value Ref Range   Sodium 134 (L) 135 - 145 mmol/L   Potassium 3.6 3.5 - 5.1 mmol/L   Chloride 104 101 - 111 mmol/L   CO2 23  22 - 32 mmol/L   Glucose, Bld 141 (H) 65 - 99 mg/dL   BUN 20 6 - 20 mg/dL   Creatinine, Ser 1.52 (H) 0.44 - 1.00 mg/dL   Calcium 8.4 (L) 8.9 - 10.3 mg/dL   Total Protein 6.6 6.5 - 8.1 g/dL   Albumin 2.4 (L) 3.5 - 5.0 g/dL   AST 102 (H) 15 - 41 U/L   ALT 79 (H) 14 - 54 U/L   Alkaline Phosphatase 391 (H) 38 - 126 U/L   Total Bilirubin 8.2 (H) 0.3 - 1.2 mg/dL   GFR calc non Af Amer 32 (L) >60 mL/min   GFR calc Af Amer 38 (L) >60 mL/min   Anion gap 7 5 - 15  Glucose, capillary     Status: Abnormal   Collection Time: 06/21/16  7:41 AM  Result Value Ref Range   Glucose-Capillary 144 (H) 65 - 99 mg/dL   Comment 1 Notify RN    Comment 2 Document in Chart     Studies/Results: No results found.    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 Follow-up with oncology next week as planned.    Cassell Clement 06/21/2016, 9:39 AM  Pager: [phone] If no answer or after 5 PM call [phone]

## 2016-06-23 DIAGNOSIS — R531 Weakness: Secondary | ICD-10-CM | POA: Diagnosis not present

## 2016-06-23 DIAGNOSIS — E46 Unspecified protein-calorie malnutrition: Secondary | ICD-10-CM | POA: Diagnosis not present

## 2016-06-23 DIAGNOSIS — D649 Anemia, unspecified: Secondary | ICD-10-CM | POA: Diagnosis not present

## 2016-06-23 DIAGNOSIS — C799 Secondary malignant neoplasm of unspecified site: Secondary | ICD-10-CM | POA: Diagnosis not present

## 2016-06-23 LAB — CULTURE, BLOOD (ROUTINE X 2)
CULTURE: NO GROWTH
Culture: NO GROWTH

## 2016-06-25 DIAGNOSIS — K831 Obstruction of bile duct: Secondary | ICD-10-CM | POA: Diagnosis not present

## 2016-06-25 DIAGNOSIS — E119 Type 2 diabetes mellitus without complications: Secondary | ICD-10-CM | POA: Diagnosis not present

## 2016-06-25 DIAGNOSIS — R197 Diarrhea, unspecified: Secondary | ICD-10-CM | POA: Diagnosis not present

## 2016-06-25 DIAGNOSIS — M810 Age-related osteoporosis without current pathological fracture: Secondary | ICD-10-CM | POA: Diagnosis not present

## 2016-06-25 DIAGNOSIS — K828 Other specified diseases of gallbladder: Secondary | ICD-10-CM | POA: Diagnosis not present

## 2016-06-25 DIAGNOSIS — D550 Anemia due to glucose-6-phosphate dehydrogenase [G6PD] deficiency: Secondary | ICD-10-CM | POA: Diagnosis not present

## 2016-06-25 DIAGNOSIS — Z880 Allergy status to penicillin: Secondary | ICD-10-CM | POA: Diagnosis not present

## 2016-06-25 DIAGNOSIS — Z7982 Long term (current) use of aspirin: Secondary | ICD-10-CM | POA: Diagnosis not present

## 2016-06-25 DIAGNOSIS — C23 Malignant neoplasm of gallbladder: Secondary | ICD-10-CM | POA: Diagnosis not present

## 2016-06-25 DIAGNOSIS — C786 Secondary malignant neoplasm of retroperitoneum and peritoneum: Secondary | ICD-10-CM | POA: Diagnosis not present

## 2016-06-25 DIAGNOSIS — I10 Essential (primary) hypertension: Secondary | ICD-10-CM | POA: Diagnosis not present

## 2016-06-25 DIAGNOSIS — Z6824 Body mass index (BMI) 24.0-24.9, adult: Secondary | ICD-10-CM | POA: Diagnosis not present

## 2016-06-25 DIAGNOSIS — Z794 Long term (current) use of insulin: Secondary | ICD-10-CM | POA: Diagnosis not present

## 2016-06-26 DIAGNOSIS — C786 Secondary malignant neoplasm of retroperitoneum and peritoneum: Secondary | ICD-10-CM | POA: Diagnosis not present

## 2016-06-26 DIAGNOSIS — C221 Intrahepatic bile duct carcinoma: Secondary | ICD-10-CM | POA: Diagnosis not present

## 2016-06-27 ENCOUNTER — Ambulatory Visit: Payer: Medicare Other | Admitting: Oncology

## 2016-06-27 ENCOUNTER — Other Ambulatory Visit: Payer: Medicare Other

## 2016-06-27 DIAGNOSIS — E46 Unspecified protein-calorie malnutrition: Secondary | ICD-10-CM | POA: Diagnosis not present

## 2016-06-27 DIAGNOSIS — D649 Anemia, unspecified: Secondary | ICD-10-CM | POA: Diagnosis not present

## 2016-06-27 DIAGNOSIS — C799 Secondary malignant neoplasm of unspecified site: Secondary | ICD-10-CM | POA: Diagnosis not present

## 2016-06-27 DIAGNOSIS — R531 Weakness: Secondary | ICD-10-CM | POA: Diagnosis not present

## 2016-06-30 DIAGNOSIS — C801 Malignant (primary) neoplasm, unspecified: Secondary | ICD-10-CM | POA: Diagnosis not present

## 2016-06-30 DIAGNOSIS — D649 Anemia, unspecified: Secondary | ICD-10-CM | POA: Diagnosis not present

## 2016-06-30 DIAGNOSIS — R531 Weakness: Secondary | ICD-10-CM | POA: Diagnosis not present

## 2016-06-30 DIAGNOSIS — C799 Secondary malignant neoplasm of unspecified site: Secondary | ICD-10-CM | POA: Diagnosis not present

## 2016-06-30 DIAGNOSIS — C221 Intrahepatic bile duct carcinoma: Secondary | ICD-10-CM | POA: Diagnosis not present

## 2016-06-30 DIAGNOSIS — C786 Secondary malignant neoplasm of retroperitoneum and peritoneum: Secondary | ICD-10-CM | POA: Diagnosis not present

## 2016-06-30 DIAGNOSIS — E46 Unspecified protein-calorie malnutrition: Secondary | ICD-10-CM | POA: Diagnosis not present

## 2016-07-02 DIAGNOSIS — Z794 Long term (current) use of insulin: Secondary | ICD-10-CM | POA: Diagnosis not present

## 2016-07-02 DIAGNOSIS — N189 Chronic kidney disease, unspecified: Secondary | ICD-10-CM | POA: Diagnosis not present

## 2016-07-02 DIAGNOSIS — Z7982 Long term (current) use of aspirin: Secondary | ICD-10-CM | POA: Diagnosis not present

## 2016-07-02 DIAGNOSIS — Z87891 Personal history of nicotine dependence: Secondary | ICD-10-CM | POA: Diagnosis not present

## 2016-07-02 DIAGNOSIS — I129 Hypertensive chronic kidney disease with stage 1 through stage 4 chronic kidney disease, or unspecified chronic kidney disease: Secondary | ICD-10-CM | POA: Diagnosis not present

## 2016-07-02 DIAGNOSIS — Z880 Allergy status to penicillin: Secondary | ICD-10-CM | POA: Diagnosis not present

## 2016-07-02 DIAGNOSIS — E1122 Type 2 diabetes mellitus with diabetic chronic kidney disease: Secondary | ICD-10-CM | POA: Diagnosis not present

## 2016-07-02 DIAGNOSIS — C221 Intrahepatic bile duct carcinoma: Secondary | ICD-10-CM | POA: Diagnosis not present

## 2016-07-02 DIAGNOSIS — K831 Obstruction of bile duct: Secondary | ICD-10-CM | POA: Diagnosis not present

## 2016-07-03 DIAGNOSIS — R531 Weakness: Secondary | ICD-10-CM | POA: Diagnosis not present

## 2016-07-03 DIAGNOSIS — D649 Anemia, unspecified: Secondary | ICD-10-CM | POA: Diagnosis not present

## 2016-07-03 DIAGNOSIS — E46 Unspecified protein-calorie malnutrition: Secondary | ICD-10-CM | POA: Diagnosis not present

## 2016-07-03 DIAGNOSIS — C799 Secondary malignant neoplasm of unspecified site: Secondary | ICD-10-CM | POA: Diagnosis not present

## 2016-07-07 DIAGNOSIS — C799 Secondary malignant neoplasm of unspecified site: Secondary | ICD-10-CM | POA: Diagnosis not present

## 2016-07-07 DIAGNOSIS — D649 Anemia, unspecified: Secondary | ICD-10-CM | POA: Diagnosis not present

## 2016-07-07 DIAGNOSIS — R531 Weakness: Secondary | ICD-10-CM | POA: Diagnosis not present

## 2016-07-07 DIAGNOSIS — E46 Unspecified protein-calorie malnutrition: Secondary | ICD-10-CM | POA: Diagnosis not present

## 2016-07-10 ENCOUNTER — Telehealth: Payer: Self-pay | Admitting: *Deleted

## 2016-07-10 NOTE — Telephone Encounter (Signed)
Called pt to follow up after missed office visit. She reports feeling well after hospital discharge. She has an appt at [facility] tomorrow. Will have schedulers call with new appt.

## 2016-07-11 ENCOUNTER — Telehealth: Payer: Self-pay | Admitting: Oncology

## 2016-07-11 DIAGNOSIS — R197 Diarrhea, unspecified: Secondary | ICD-10-CM | POA: Diagnosis not present

## 2016-07-11 DIAGNOSIS — Z888 Allergy status to other drugs, medicaments and biological substances status: Secondary | ICD-10-CM | POA: Diagnosis not present

## 2016-07-11 DIAGNOSIS — Z79899 Other long term (current) drug therapy: Secondary | ICD-10-CM | POA: Diagnosis not present

## 2016-07-11 DIAGNOSIS — C786 Secondary malignant neoplasm of retroperitoneum and peritoneum: Secondary | ICD-10-CM | POA: Diagnosis not present

## 2016-07-11 DIAGNOSIS — Z803 Family history of malignant neoplasm of breast: Secondary | ICD-10-CM | POA: Diagnosis not present

## 2016-07-11 DIAGNOSIS — Z809 Family history of malignant neoplasm, unspecified: Secondary | ICD-10-CM | POA: Diagnosis not present

## 2016-07-11 DIAGNOSIS — K831 Obstruction of bile duct: Secondary | ICD-10-CM | POA: Diagnosis not present

## 2016-07-11 DIAGNOSIS — Z880 Allergy status to penicillin: Secondary | ICD-10-CM | POA: Diagnosis not present

## 2016-07-11 DIAGNOSIS — Z7982 Long term (current) use of aspirin: Secondary | ICD-10-CM | POA: Diagnosis not present

## 2016-07-11 DIAGNOSIS — E119 Type 2 diabetes mellitus without complications: Secondary | ICD-10-CM | POA: Diagnosis not present

## 2016-07-11 DIAGNOSIS — I10 Essential (primary) hypertension: Secondary | ICD-10-CM | POA: Diagnosis not present

## 2016-07-11 DIAGNOSIS — Z794 Long term (current) use of insulin: Secondary | ICD-10-CM | POA: Diagnosis not present

## 2016-07-11 DIAGNOSIS — C23 Malignant neoplasm of gallbladder: Secondary | ICD-10-CM | POA: Diagnosis not present

## 2016-07-11 DIAGNOSIS — M810 Age-related osteoporosis without current pathological fracture: Secondary | ICD-10-CM | POA: Diagnosis not present

## 2016-07-11 DIAGNOSIS — Z800 Family history of malignant neoplasm of digestive organs: Secondary | ICD-10-CM | POA: Diagnosis not present

## 2016-07-11 NOTE — Telephone Encounter (Signed)
lvm to inform pt of 3/13 appt at 1115 am per LOS

## 2016-07-22 ENCOUNTER — Other Ambulatory Visit (HOSPITAL_BASED_OUTPATIENT_CLINIC_OR_DEPARTMENT_OTHER): Payer: Medicare Other

## 2016-07-22 ENCOUNTER — Ambulatory Visit (HOSPITAL_BASED_OUTPATIENT_CLINIC_OR_DEPARTMENT_OTHER): Payer: Medicare Other | Admitting: Nurse Practitioner

## 2016-07-22 VITALS — BP 159/70 | HR 88 | Temp 98.6°F | Resp 18 | Ht 67.0 in | Wt 151.6 lb

## 2016-07-22 DIAGNOSIS — R634 Abnormal weight loss: Secondary | ICD-10-CM

## 2016-07-22 DIAGNOSIS — K831 Obstruction of bile duct: Secondary | ICD-10-CM

## 2016-07-22 DIAGNOSIS — D649 Anemia, unspecified: Secondary | ICD-10-CM | POA: Diagnosis not present

## 2016-07-22 DIAGNOSIS — C801 Malignant (primary) neoplasm, unspecified: Secondary | ICD-10-CM | POA: Diagnosis not present

## 2016-07-22 DIAGNOSIS — C799 Secondary malignant neoplasm of unspecified site: Secondary | ICD-10-CM

## 2016-07-22 LAB — COMPREHENSIVE METABOLIC PANEL
ALT: 118 U/L — AB (ref 0–55)
ANION GAP: 15 meq/L — AB (ref 3–11)
AST: 175 U/L (ref 5–34)
Albumin: 2.2 g/dL — ABNORMAL LOW (ref 3.5–5.0)
Alkaline Phosphatase: 841 U/L — ABNORMAL HIGH (ref 40–150)
BUN: 17.7 mg/dL (ref 7.0–26.0)
CHLORIDE: 91 meq/L — AB (ref 98–109)
CO2: 28 meq/L (ref 22–29)
CREATININE: 1.6 mg/dL — AB (ref 0.6–1.1)
Calcium: 9.5 mg/dL (ref 8.4–10.4)
EGFR: 35 mL/min/{1.73_m2} — AB (ref >90)
Glucose: 371 mg/dl — ABNORMAL HIGH (ref 70–140)
Potassium: 3.2 mEq/L — ABNORMAL LOW (ref 3.5–5.1)
Sodium: 133 mEq/L — ABNORMAL LOW (ref 136–145)
Total Bilirubin: 12.12 mg/dL (ref 0.20–1.20)
Total Protein: 7.8 g/dL (ref 6.4–8.3)

## 2016-07-22 LAB — CBC WITH DIFFERENTIAL/PLATELET
BASO%: 0.4 % (ref 0.0–2.0)
Basophils Absolute: 0 10*3/uL (ref 0.0–0.1)
EOS%: 3.5 % (ref 0.0–7.0)
Eosinophils Absolute: 0.3 10*3/uL (ref 0.0–0.5)
HCT: 30.9 % — ABNORMAL LOW (ref 34.8–46.6)
HGB: 10.1 g/dL — ABNORMAL LOW (ref 11.6–15.9)
LYMPH%: 21.9 % (ref 14.0–49.7)
MCH: 28 pg (ref 25.1–34.0)
MCHC: 32.7 g/dL (ref 31.5–36.0)
MCV: 85.6 fL (ref 79.5–101.0)
MONO#: 0.8 10*3/uL (ref 0.1–0.9)
MONO%: 8.5 % (ref 0.0–14.0)
NEUT#: 6.1 10*3/uL (ref 1.5–6.5)
NEUT%: 65.7 % (ref 38.4–76.8)
PLATELETS: 410 10*3/uL — AB (ref 145–400)
RBC: 3.61 10*6/uL — AB (ref 3.70–5.45)
RDW: 17.7 % — ABNORMAL HIGH (ref 11.2–14.5)
WBC: 9.2 10*3/uL (ref 3.9–10.3)
lymph#: 2 10*3/uL (ref 0.9–3.3)

## 2016-07-22 NOTE — Progress Notes (Addendum)
  [facility] OFFICE PROGRESS NOTE   Diagnosis:  Metastatic adenocarcinoma  INTERVAL HISTORY:   [name] returns for follow-up. She notes less generalized itching. Appetite remains poor overall alhtough she has noted some slight improvement recently. Energy level is poor. Some days she remains in bed the majority of the day. She denies abdominal pain. She has occasional back pain. No nausea or vomiting. No constipation or diarrhea. No fever.  Objective:  Vital signs in last 24 hours:  Blood pressure (!) 159/70, pulse 88, temperature 98.6 F (37 C), temperature source Oral, resp. rate 18, height 5\' 7"  (1.702 m), weight 151 lb 9.6 oz (68.8 kg), SpO2 100 %.    HEENT: Scleral icterus. Resp: Lungs clear bilaterally. Cardio: Regular rate and rhythm. GI: Abdomen soft and nontender. No hepatomegaly. Vascular: No leg edema.    Lab Results:  Lab Results  Component Value Date   WBC 9.2 07/22/2016   HGB 10.1 (L) 07/22/2016   HCT 30.9 (L) 07/22/2016   MCV 85.6 07/22/2016   PLT 410 (H) 07/22/2016   NEUTROABS 6.1 07/22/2016    Imaging:  No results found.  Medications: I have reviewed the patient's current medications.  Assessment/Plan: 1. Metastatic Adenocarcinoma ? CT/MRI abdomen with omental/peritoneal nodules and thickening, portal adenopathy, single liver lesion, gallbladder wall thickening ? ERCP with CBD obstruction  2. 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 Microcytic anemia-Could be anemia of chronic disease versus iron deficiency anemia  4. Diabetes  5. Anorexia/wt. Loss  6.  Admission 06/17/2016 with a fever and persistent elevation of the bilirubin-no recurrent fever on antibiotics, fever likely secondary to a liver abscess/cholangitis versus "tumor "fever   Disposition:[name] appears unchanged. Her performance status remains poor. The bilirubin remains markedly elevated despite placement of a biliary stent.  She understands she is not a candidate for chemotherapy with her current performance status and the elevated bilirubin. She and her son further understand that no therapy will be curative. We discussed a supportive care approach/hospice referral. She declines this at present. She would like to follow-up with Dr. Truman Hayward at [facility] later this week before making a decision.  She is scheduled for CT scans and follow-up with Dr. Truman Hayward 07/25/2016. We will schedule a return visit here on 08/08/2016. She will contact the office in the interim with any problems.  Patient seen with Dr. Benay Spice. 25 minutes were spent face-to-face at today's visit with the majority of that time involved in counseling/coordination of care.  Ned Card ANP/GNP-BC   07/22/2016  11:47 AM  This was a shared visit with Ned Card. We reviewed the notes from [facility] and discussed the prognosis with [name] and her son. She will undergo a restaging CT at [facility] later this week. She may be a candidate for additional bile duct decompression. I do not recommend systemic chemotherapy at present and she will only be a candidate for chemotherapy if the hyperbilirubinemia improves. She understands no therapy will be curative. She declines hospice care.  She will return for an office visit here on 08/08/2016.  Julieanne Manson, M.D.

## 2016-07-25 ENCOUNTER — Telehealth: Payer: Self-pay | Admitting: *Deleted

## 2016-07-25 DIAGNOSIS — E119 Type 2 diabetes mellitus without complications: Secondary | ICD-10-CM | POA: Diagnosis not present

## 2016-07-25 DIAGNOSIS — R590 Localized enlarged lymph nodes: Secondary | ICD-10-CM | POA: Diagnosis not present

## 2016-07-25 DIAGNOSIS — Z7982 Long term (current) use of aspirin: Secondary | ICD-10-CM | POA: Diagnosis not present

## 2016-07-25 DIAGNOSIS — M810 Age-related osteoporosis without current pathological fracture: Secondary | ICD-10-CM | POA: Diagnosis not present

## 2016-07-25 DIAGNOSIS — Z880 Allergy status to penicillin: Secondary | ICD-10-CM | POA: Diagnosis not present

## 2016-07-25 DIAGNOSIS — I10 Essential (primary) hypertension: Secondary | ICD-10-CM | POA: Diagnosis not present

## 2016-07-25 DIAGNOSIS — C787 Secondary malignant neoplasm of liver and intrahepatic bile duct: Secondary | ICD-10-CM | POA: Diagnosis not present

## 2016-07-25 DIAGNOSIS — C23 Malignant neoplasm of gallbladder: Secondary | ICD-10-CM | POA: Diagnosis not present

## 2016-07-25 DIAGNOSIS — R262 Difficulty in walking, not elsewhere classified: Secondary | ICD-10-CM | POA: Diagnosis not present

## 2016-07-25 DIAGNOSIS — R5383 Other fatigue: Secondary | ICD-10-CM | POA: Diagnosis not present

## 2016-07-25 DIAGNOSIS — C786 Secondary malignant neoplasm of retroperitoneum and peritoneum: Secondary | ICD-10-CM | POA: Diagnosis not present

## 2016-07-25 DIAGNOSIS — R160 Hepatomegaly, not elsewhere classified: Secondary | ICD-10-CM | POA: Diagnosis not present

## 2016-07-25 NOTE — Telephone Encounter (Signed)
Call from Dr. Truman Hayward at [facility]: Pt's scan was stable. Bilirubin remains elevated. Poor performance status. He recommended hospice. Pt is hesitant to agree to hospice due to preconceived ideas. She agreed to palliative care, referral has been made.

## 2016-08-08 ENCOUNTER — Telehealth: Payer: Self-pay | Admitting: *Deleted

## 2016-08-08 ENCOUNTER — Other Ambulatory Visit: Payer: Self-pay | Admitting: *Deleted

## 2016-08-08 ENCOUNTER — Other Ambulatory Visit (HOSPITAL_BASED_OUTPATIENT_CLINIC_OR_DEPARTMENT_OTHER): Payer: Medicare Other

## 2016-08-08 ENCOUNTER — Ambulatory Visit (HOSPITAL_COMMUNITY)
Admission: RE | Admit: 2016-08-08 | Discharge: 2016-08-08 | Disposition: A | Discharge status: Home / Self Care | Payer: Medicare Other | Source: Ambulatory Visit | Attending: Oncology | Admitting: Oncology

## 2016-08-08 ENCOUNTER — Telehealth: Payer: Self-pay | Admitting: Oncology

## 2016-08-08 ENCOUNTER — Ambulatory Visit (HOSPITAL_BASED_OUTPATIENT_CLINIC_OR_DEPARTMENT_OTHER): Payer: Medicare Other | Admitting: Oncology

## 2016-08-08 VITALS — BP 120/75 | HR 92 | Temp 97.6°F | Resp 18 | Ht 67.0 in | Wt 147.7 lb

## 2016-08-08 DIAGNOSIS — R634 Abnormal weight loss: Secondary | ICD-10-CM

## 2016-08-08 DIAGNOSIS — C799 Secondary malignant neoplasm of unspecified site: Secondary | ICD-10-CM

## 2016-08-08 DIAGNOSIS — C801 Malignant (primary) neoplasm, unspecified: Secondary | ICD-10-CM | POA: Diagnosis not present

## 2016-08-08 DIAGNOSIS — N179 Acute kidney failure, unspecified: Secondary | ICD-10-CM | POA: Insufficient documentation

## 2016-08-08 DIAGNOSIS — D649 Anemia, unspecified: Secondary | ICD-10-CM

## 2016-08-08 DIAGNOSIS — K831 Obstruction of bile duct: Secondary | ICD-10-CM

## 2016-08-08 LAB — COMPREHENSIVE METABOLIC PANEL
ALBUMIN: 2.1 g/dL — AB (ref 3.5–5.0)
ALT: 109 U/L — AB (ref 0–55)
AST: 181 U/L — AB (ref 5–34)
Anion Gap: 22 mEq/L — ABNORMAL HIGH (ref 3–11)
BUN: 85 mg/dL — AB (ref 7.0–26.0)
CO2: 18 mEq/L — ABNORMAL LOW (ref 22–29)
CREATININE: 7.2 mg/dL — AB (ref 0.6–1.1)
Calcium: 9.9 mg/dL (ref 8.4–10.4)
Chloride: 92 mEq/L — ABNORMAL LOW (ref 98–109)
EGFR: 6 mL/min/{1.73_m2} — ABNORMAL LOW (ref >90)
Glucose: 178 mg/dl — ABNORMAL HIGH (ref 70–140)
Potassium: 3.5 mEq/L (ref 3.5–5.1)
Sodium: 132 mEq/L — ABNORMAL LOW (ref 136–145)
Total Bilirubin: 12.02 mg/dL (ref 0.20–1.20)
Total Protein: 7.9 g/dL (ref 6.4–8.3)

## 2016-08-08 MED ORDER — OXYCODONE HCL 5 MG PO TABS: 2.5000 mg | ORAL_TABLET | Freq: Four times a day (QID) | ORAL | 0 refills | Status: AC | PRN

## 2016-08-08 MED ORDER — LACTULOSE 10 GM/15ML PO SOLN: 30.0000 g | Freq: Two times a day (BID) | ORAL | 0 refills | Status: AC | PRN

## 2016-08-08 NOTE — Telephone Encounter (Signed)
Referral faxed to hospice of [facility]. Lab and renal ultrasound faxed to PCP's office. Pt is requesting PCP to be attending physician for hospice. PCP office closed for holiday.  Per Dr. Benay Spice: Work in for IV fluids at [facility] on 3/31. Lab only on 4/2. Spoke with pt's daughter, Danise Mina. Instructions given to come in for hydration on 3/31. She and pt voiced understanding.

## 2016-08-08 NOTE — Progress Notes (Signed)
[facility] OFFICE PROGRESS NOTE   Diagnosis: Metastatic adenocarcinoma  INTERVAL HISTORY:   [name] returns as scheduled. She is here with her daughter. She has back pain, anorexia, and malaise. She saw Dr. Truman Hayward at [facility] on 07/25/2016. He recommended comfort care. She was placed on a no CODE BLUE status.  [name] has not yet enrolled in the [facility] hospice program.  Objective:  Vital signs in last 24 hours:  Blood pressure 120/75, pulse 92, temperature 97.6 F (36.4 C), temperature source Oral, resp. rate 18, height 5\' 7"  (1.702 m), weight 147 lb 11.2 oz (67 kg), SpO2 100 %.    HEENT: Scleral icterus, no thrush, the oral membranes are moist Resp: Lungs clear bilaterally Cardio: Regular rate and rhythm GI: No hepatosplenomegaly, nontender Vascular: No leg edema Neuro: Alert and oriented, she was able to ambulate to the examination table with assistance  Skin: Drying erythematous rash at the left abdominal wall     Lab Results:  Lab Results  Component Value Date   WBC 9.2 07/22/2016   HGB 10.1 (L) 07/22/2016   HCT 30.9 (L) 07/22/2016   MCV 85.6 07/22/2016   PLT 410 (H) 07/22/2016   NEUTROABS 6.1 07/22/2016   08/08/2016: Sodium 132, potassium 3.5, CO2 18, glucose 178, BUN 85, creatinine 7.2, calcium 9.9, albumin 2.1, bilirubin 12.02, alkaline phosphatase 1100  [facility] 07/25/2016: BUN 19, creatinine 1.49, potassium 3.0, bilirubin 11.5 Imaging:  US Renal  Result Date: 08/08/2016 CLINICAL DATA:  Acute renal failure EXAM: RENAL / URINARY TRACT ULTRASOUND COMPLETE COMPARISON:  None. FINDINGS: Right Kidney: Length: 10 cm. Echogenicity within normal limits. No mass or hydronephrosis visualized. Left Kidney: Length: 10 cm. Echogenicity within normal limits. No mass or hydronephrosis visualized. Bladder: Appears normal for degree of bladder distention. Other: Ascites noted. IMPRESSION: 1. No hydronephrosis identified Electronically Signed   By: Kerby Moors M.D.   On: 08/08/2016 14:34    Medications: I have reviewed the patient's current medications.  Assessment/Plan: 1. Metastatic Adenocarcinoma ? CT/MRI abdomen with omental/peritoneal nodules and thickening, portal adenopathy, single liver lesion, gallbladder wall thickening ? ERCP with CBD obstruction  2. 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 Microcytic anemia-Could be anemia of chronic disease versus iron deficiency anemia  4. Diabetes  5. Anorexia/wt. Loss  6. Admission 06/17/2016 with a fever and persistent elevation of the bilirubin-no recurrent fever on antibiotics, fever likely secondary to a liver abscess/cholangitis versus "tumor "fever  7.  Chronic/acute renal failure     Disposition:  [name] has metastatic adenocarcinoma, most likely of gallbladder or bile duct origin. Her performance status is declining. The bilirubin remains elevated despite placement of a bile duct stent. She is not a candidate for chemotherapy. I recommend comfort care. She agrees to a home hospice referral. We will make a referral to the [facility] program. She confirmed her desire for a no CODE BLUE status.  [name] has chronic renal failure with an acute rise in the BUN and creatinine today. We obtained a renal ultrasound and this demonstrated no hydronephrosis.  The elevated BUN and creatinine could be related to acute renal injury from  the CT dye given at [facility] on 07/25/2016. She does not appear symptomatic from renal failure today. I recommended she push oral fluids and follow-up with Dr.Wolters on 08/11/2016. We will contact Dr. Stephanie Acre with the laboratory results from today.   She would like Dr. Stephanie Acre to serve as the primary physician with  the hospice program.  [name]  is not scheduled for a follow-up appointment at the [facility]. I am available to see her in the future as needed.   30 minutes were spent with the patient  today. The majority of the time was used for counseling and coordination of care.  [name], MD  08/08/2016  3:30 PM

## 2016-08-08 NOTE — Addendum Note (Signed)
Addended by: Brien Few on: 08/08/2016 03:42 PM   Modules accepted: Orders

## 2016-08-08 NOTE — Telephone Encounter (Signed)
Called patient re fluids for tomorrow and lab for Monday 4/2. Patient/Dtr question reason for appointments and was transferred to desk nurse - desk nurse took call and will communicate reason for appointments as well as dates/times.

## 2016-08-09 ENCOUNTER — Ambulatory Visit (HOSPITAL_BASED_OUTPATIENT_CLINIC_OR_DEPARTMENT_OTHER): Payer: Medicare Other

## 2016-08-09 ENCOUNTER — Other Ambulatory Visit: Payer: Self-pay | Admitting: *Deleted

## 2016-08-09 DIAGNOSIS — C799 Secondary malignant neoplasm of unspecified site: Secondary | ICD-10-CM

## 2016-08-09 DIAGNOSIS — C801 Malignant (primary) neoplasm, unspecified: Secondary | ICD-10-CM | POA: Diagnosis not present

## 2016-08-09 MED ORDER — SODIUM CHLORIDE 0.9 % IV SOLN
INTRAVENOUS | Status: DC
  Administered 2016-08-09: 11:00:00 via INTRAVENOUS

## 2016-08-09 NOTE — Patient Instructions (Signed)
Dehydration, Adult Dehydration is a condition in which there is not enough fluid or water in the body. This happens when you lose more fluids than you take in. Important organs, such as the kidneys, brain, and heart, cannot function without a proper amount of fluids. Any loss of fluids from the body can lead to dehydration. Dehydration can range from mild to severe. This condition should be treated right away to prevent it from becoming severe. What are the causes? This condition may be caused by:  Vomiting.  Diarrhea.  Excessive sweating, such as from heat exposure or exercise.  Not drinking enough fluid, especially:  When ill.  While doing activity that requires a lot of energy.  Excessive urination.  Fever.  Infection.  Certain medicines, such as medicines that cause the body to lose excess fluid (diuretics).  Inability to access safe drinking water.  Reduced physical ability to get adequate water and food. What increases the risk? This condition is more likely to develop in people:  Who have a poorly controlled long-term (chronic) illness, such as diabetes, heart disease, or kidney disease.  Who are age 65 or older.  Who are disabled.  Who live in a place with high altitude.  Who play endurance sports. What are the signs or symptoms? Symptoms of mild dehydration may include:   Thirst.  Dry lips.  Slightly dry mouth.  Dry, warm skin.  Dizziness. Symptoms of moderate dehydration may include:   Very dry mouth.  Muscle cramps.  Dark urine. Urine may be the color of tea.  Decreased urine production.  Decreased tear production.  Heartbeat that is irregular or faster than normal (palpitations).  Headache.  Light-headedness, especially when you stand up from a sitting position.  Fainting (syncope). Symptoms of severe dehydration may include:   Changes in skin, such as:  Cold and clammy skin.  Blotchy (mottled) or pale skin.  Skin that does  not quickly return to normal after being lightly pinched and released (poor skin turgor).  Changes in body fluids, such as:  Extreme thirst.  No tear production.  Inability to sweat when body temperature is high, such as in hot weather.  Very little urine production.  Changes in vital signs, such as:  Weak pulse.  Pulse that is more than 100 beats a minute when sitting still.  Rapid breathing.  Low blood pressure.  Other changes, such as:  Sunken eyes.  Cold hands and feet.  Confusion.  Lack of energy (lethargy).  Difficulty waking up from sleep.  Short-term weight loss.  Unconsciousness. How is this diagnosed? This condition is diagnosed based on your symptoms and a physical exam. Blood and urine tests may be done to help confirm the diagnosis. How is this treated? Treatment for this condition depends on the severity. Mild or moderate dehydration can often be treated at home. Treatment should be started right away. Do not wait until dehydration becomes severe. Severe dehydration is an emergency and it needs to be treated in a hospital. Treatment for mild dehydration may include:   Drinking more fluids.  Replacing salts and minerals in your blood (electrolytes) that you may have lost. Treatment for moderate dehydration may include:   Drinking an oral rehydration solution (ORS). This is a drink that helps you replace fluids and electrolytes (rehydrate). It can be found at pharmacies and retail stores. Treatment for severe dehydration may include:   Receiving fluids through an IV tube.  Receiving an electrolyte solution through a feeding tube that is   passed through your nose and into your stomach (nasogastric tube, or NG tube).  Correcting any abnormalities in electrolytes.  Treating the underlying cause of dehydration. Follow these instructions at home:  If directed by your health care provider, drink an ORS:  Make an ORS by following instructions on the  package.  Start by drinking small amounts, about  cup (120 mL) every 5-10 minutes.  Slowly increase how much you drink until you have taken the amount recommended by your health care provider.  Drink enough clear fluid to keep your urine clear or pale yellow. If you were told to drink an ORS, finish the ORS first, then start slowly drinking other clear fluids. Drink fluids such as:  Water. Do not drink only water. Doing that can lead to having too little salt (sodium) in the body (hyponatremia).  Ice chips.  Fruit juice that you have added water to (diluted fruit juice).  Low-calorie sports drinks.  Avoid:  Alcohol.  Drinks that contain a lot of sugar. These include high-calorie sports drinks, fruit juice that is not diluted, and soda.  Caffeine.  Foods that are greasy or contain a lot of fat or sugar.  Take over-the-counter and prescription medicines only as told by your health care provider.  Do not take sodium tablets. This can lead to having too much sodium in the body (hypernatremia).  Eat foods that contain a healthy balance of electrolytes, such as bananas, oranges, potatoes, tomatoes, and spinach.  Keep all follow-up visits as told by your health care provider. This is important. Contact a health care provider if:  You have abdominal pain that:  Gets worse.  Stays in one area (localizes).  You have a rash.  You have a stiff neck.  You are more irritable than usual.  You are sleepier or more difficult to wake up than usual.  You feel weak or dizzy.  You feel very thirsty.  You have urinated only a small amount of very dark urine over 6-8 hours. Get help right away if:  You have symptoms of severe dehydration.  You cannot drink fluids without vomiting.  Your symptoms get worse with treatment.  You have a fever.  You have a severe headache.  You have vomiting or diarrhea that:  Gets worse.  Does not go away.  You have blood or green matter  (bile) in your vomit.  You have blood in your stool. This may cause stool to look black and tarry.  You have not urinated in 6-8 hours.  You faint.  Your heart rate while sitting still is over 100 beats a minute.  You have trouble breathing. This information is not intended to replace advice given to you by your health care provider. Make sure you discuss any questions you have with your health care provider. Document Released: 04/28/2005 Document Revised: 11/23/2015 Document Reviewed: 06/22/2015 Elsevier Interactive Patient Education  2017 [employer]  

## 2016-08-11 ENCOUNTER — Other Ambulatory Visit (HOSPITAL_BASED_OUTPATIENT_CLINIC_OR_DEPARTMENT_OTHER): Payer: Medicare Other

## 2016-08-11 ENCOUNTER — Telehealth: Payer: Self-pay | Admitting: *Deleted

## 2016-08-11 ENCOUNTER — Encounter: Payer: Self-pay | Admitting: *Deleted

## 2016-08-11 ENCOUNTER — Other Ambulatory Visit: Payer: Self-pay | Admitting: *Deleted

## 2016-08-11 DIAGNOSIS — C801 Malignant (primary) neoplasm, unspecified: Secondary | ICD-10-CM

## 2016-08-11 DIAGNOSIS — C799 Secondary malignant neoplasm of unspecified site: Secondary | ICD-10-CM

## 2016-08-11 LAB — COMPREHENSIVE METABOLIC PANEL
ALT: 113 U/L — ABNORMAL HIGH (ref 0–55)
AST: 145 U/L — ABNORMAL HIGH (ref 5–34)
Albumin: 2 g/dL — ABNORMAL LOW (ref 3.5–5.0)
Alkaline Phosphatase: 1117 U/L — ABNORMAL HIGH (ref 40–150)
Anion Gap: 21 mEq/L — ABNORMAL HIGH (ref 3–11)
BUN: 93.1 mg/dL — ABNORMAL HIGH (ref 7.0–26.0)
CHLORIDE: 94 meq/L — AB (ref 98–109)
CO2: 19 meq/L — AB (ref 22–29)
Calcium: 9 mg/dL (ref 8.4–10.4)
Creatinine: 8.4 mg/dL (ref 0.6–1.1)
EGFR: 5 mL/min/{1.73_m2} — AB (ref >90)
GLUCOSE: 317 mg/dL — AB (ref 70–140)
Potassium: 3.6 mEq/L (ref 3.5–5.1)
SODIUM: 134 meq/L — AB (ref 136–145)
TOTAL PROTEIN: 7.4 g/dL (ref 6.4–8.3)
Total Bilirubin: 13 mg/dL (ref 0.20–1.20)

## 2016-08-11 NOTE — Telephone Encounter (Signed)
Call placed to Dr. Stephanie Acre office to inform them that pt request Dr. Stephanie Acre as hospice attending.  Labs and [place] report refaxed to Dr. Stephanie Acre office at [phone] per their request.

## 2016-08-11 NOTE — Progress Notes (Signed)
FMLA documents given to MD [place] office to sign and return

## 2016-08-11 NOTE — Progress Notes (Signed)
Patient here for CMET.  CMET results reviewed with Dr. Inda Merlin by Dr. Benay Spice.  Patient and patient's daughter wish to f/u with nephrologist.  Call placed to [facility] and pt has a current appt scheduled for tomorrow with Dr. Mercy Moore at 2:45PM.  Dr. Benay Spice to speak with Dr. Mercy Moore prior to pt.'s appt tomorrow.

## 2016-08-12 DIAGNOSIS — I10 Essential (primary) hypertension: Secondary | ICD-10-CM | POA: Diagnosis not present

## 2016-08-12 DIAGNOSIS — C221 Intrahepatic bile duct carcinoma: Secondary | ICD-10-CM | POA: Diagnosis not present

## 2016-08-12 DIAGNOSIS — E1122 Type 2 diabetes mellitus with diabetic chronic kidney disease: Secondary | ICD-10-CM | POA: Diagnosis not present

## 2016-08-12 DIAGNOSIS — N183 Chronic kidney disease, stage 3 (moderate): Secondary | ICD-10-CM | POA: Diagnosis not present

## 2016-08-12 NOTE — Progress Notes (Signed)
Paper signed by MD Benay Spice and FMLA paper faxed to [phone]

## 2016-08-15 ENCOUNTER — Encounter (HOSPITAL_COMMUNITY): Payer: Self-pay | Admitting: Emergency Medicine

## 2016-08-15 ENCOUNTER — Emergency Department (HOSPITAL_COMMUNITY): Payer: Medicare Other

## 2016-08-15 ENCOUNTER — Inpatient Hospital Stay (HOSPITAL_COMMUNITY)
Admission: EM | Admit: 2016-08-15 | Discharge: 2016-09-09 | DRG: 435 | Disposition: E | Payer: Medicare Other | Attending: Internal Medicine | Admitting: Internal Medicine

## 2016-08-15 DIAGNOSIS — N179 Acute kidney failure, unspecified: Secondary | ICD-10-CM | POA: Diagnosis present

## 2016-08-15 DIAGNOSIS — M868X9 Other osteomyelitis, unspecified sites: Secondary | ICD-10-CM | POA: Diagnosis present

## 2016-08-15 DIAGNOSIS — E1122 Type 2 diabetes mellitus with diabetic chronic kidney disease: Secondary | ICD-10-CM | POA: Diagnosis present

## 2016-08-15 DIAGNOSIS — Z888 Allergy status to other drugs, medicaments and biological substances status: Secondary | ICD-10-CM | POA: Diagnosis not present

## 2016-08-15 DIAGNOSIS — N185 Chronic kidney disease, stage 5: Secondary | ICD-10-CM | POA: Diagnosis not present

## 2016-08-15 DIAGNOSIS — Z794 Long term (current) use of insulin: Secondary | ICD-10-CM

## 2016-08-15 DIAGNOSIS — Z833 Family history of diabetes mellitus: Secondary | ICD-10-CM | POA: Diagnosis not present

## 2016-08-15 DIAGNOSIS — R531 Weakness: Secondary | ICD-10-CM | POA: Diagnosis not present

## 2016-08-15 DIAGNOSIS — G9340 Encephalopathy, unspecified: Secondary | ICD-10-CM | POA: Diagnosis present

## 2016-08-15 DIAGNOSIS — Z809 Family history of malignant neoplasm, unspecified: Secondary | ICD-10-CM | POA: Diagnosis not present

## 2016-08-15 DIAGNOSIS — Z87891 Personal history of nicotine dependence: Secondary | ICD-10-CM | POA: Diagnosis not present

## 2016-08-15 DIAGNOSIS — R64 Cachexia: Secondary | ICD-10-CM | POA: Diagnosis not present

## 2016-08-15 DIAGNOSIS — Z7189 Other specified counseling: Secondary | ICD-10-CM

## 2016-08-15 DIAGNOSIS — Z6822 Body mass index (BMI) 22.0-22.9, adult: Secondary | ICD-10-CM | POA: Diagnosis not present

## 2016-08-15 DIAGNOSIS — I120 Hypertensive chronic kidney disease with stage 5 chronic kidney disease or end stage renal disease: Secondary | ICD-10-CM | POA: Diagnosis not present

## 2016-08-15 DIAGNOSIS — C221 Intrahepatic bile duct carcinoma: Principal | ICD-10-CM | POA: Diagnosis present

## 2016-08-15 DIAGNOSIS — E860 Dehydration: Secondary | ICD-10-CM | POA: Diagnosis present

## 2016-08-15 DIAGNOSIS — N184 Chronic kidney disease, stage 4 (severe): Secondary | ICD-10-CM

## 2016-08-15 DIAGNOSIS — E871 Hypo-osmolality and hyponatremia: Secondary | ICD-10-CM | POA: Diagnosis not present

## 2016-08-15 DIAGNOSIS — Z880 Allergy status to penicillin: Secondary | ICD-10-CM | POA: Diagnosis not present

## 2016-08-15 DIAGNOSIS — M810 Age-related osteoporosis without current pathological fracture: Secondary | ICD-10-CM | POA: Diagnosis present

## 2016-08-15 DIAGNOSIS — Z66 Do not resuscitate: Secondary | ICD-10-CM | POA: Diagnosis not present

## 2016-08-15 DIAGNOSIS — J9811 Atelectasis: Secondary | ICD-10-CM | POA: Diagnosis not present

## 2016-08-15 DIAGNOSIS — Z515 Encounter for palliative care: Secondary | ICD-10-CM

## 2016-08-15 DIAGNOSIS — R627 Adult failure to thrive: Secondary | ICD-10-CM | POA: Diagnosis present

## 2016-08-15 HISTORY — DX: Malignant (primary) neoplasm, unspecified: C80.1

## 2016-08-15 LAB — URINALYSIS, ROUTINE W REFLEX MICROSCOPIC
GLUCOSE, UA: 50 mg/dL — AB
Ketones, ur: NEGATIVE mg/dL
Nitrite: NEGATIVE
PROTEIN: 30 mg/dL — AB
Specific Gravity, Urine: 1.013 (ref 1.005–1.030)
pH: 5 (ref 5.0–8.0)

## 2016-08-15 LAB — CBC WITH DIFFERENTIAL/PLATELET
BASOS ABS: 0 10*3/uL (ref 0.0–0.1)
BASOS PCT: 0 %
Eosinophils Absolute: 0.2 10*3/uL (ref 0.0–0.7)
Eosinophils Relative: 1 %
HEMATOCRIT: 28.3 % — AB (ref 36.0–46.0)
HEMOGLOBIN: 9.8 g/dL — AB (ref 12.0–15.0)
LYMPHS PCT: 8 %
Lymphs Abs: 1.3 10*3/uL (ref 0.7–4.0)
MCH: 29 pg (ref 26.0–34.0)
MCHC: 34.6 g/dL (ref 30.0–36.0)
MCV: 83.7 fL (ref 78.0–100.0)
Monocytes Absolute: 0.9 10*3/uL (ref 0.1–1.0)
Monocytes Relative: 6 %
NEUTROS ABS: 13.1 10*3/uL — AB (ref 1.7–7.7)
Neutrophils Relative %: 85 %
Platelets: 391 10*3/uL (ref 150–400)
RBC: 3.38 MIL/uL — AB (ref 3.87–5.11)
RDW: 16.5 % — AB (ref 11.5–15.5)
WBC: 15.4 10*3/uL — ABNORMAL HIGH (ref 4.0–10.5)

## 2016-08-15 LAB — COMPREHENSIVE METABOLIC PANEL
ALBUMIN: 2.1 g/dL — AB (ref 3.5–5.0)
ALK PHOS: 941 U/L — AB (ref 38–126)
ALT: 89 U/L — AB (ref 14–54)
AST: 118 U/L — AB (ref 15–41)
BILIRUBIN TOTAL: 14.7 mg/dL — AB (ref 0.3–1.2)
BUN: 128 mg/dL — ABNORMAL HIGH (ref 6–20)
CALCIUM: 7.6 mg/dL — AB (ref 8.9–10.3)
CHLORIDE: 86 mmol/L — AB (ref 101–111)
CO2: 14 mmol/L — ABNORMAL LOW (ref 22–32)
CREATININE: 9.36 mg/dL — AB (ref 0.44–1.00)
GFR calc Af Amer: 4 mL/min — ABNORMAL LOW (ref >60)
GFR, EST NON AFRICAN AMERICAN: 4 mL/min — AB (ref >60)
Glucose, Bld: 259 mg/dL — ABNORMAL HIGH (ref 65–99)
Potassium: 3.3 mmol/L — ABNORMAL LOW (ref 3.5–5.1)
Sodium: 125 mmol/L — ABNORMAL LOW (ref 135–145)
Total Protein: 7.3 g/dL (ref 6.5–8.1)

## 2016-08-15 LAB — GLUCOSE, CAPILLARY
Glucose-Capillary: 249 mg/dL — ABNORMAL HIGH (ref 65–99)
Glucose-Capillary: 209 mg/dL — ABNORMAL HIGH (ref 65–99)

## 2016-08-15 LAB — LIPASE, BLOOD: Lipase: 15 U/L (ref 11–51)

## 2016-08-15 LAB — TROPONIN I: Troponin I: 0.15 ng/mL (ref <0.03)

## 2016-08-15 LAB — CBG MONITORING, ED: GLUCOSE-CAPILLARY: 249 mg/dL — AB (ref 65–99)

## 2016-08-15 LAB — I-STAT CG4 LACTIC ACID, ED: LACTIC ACID, VENOUS: 1.83 mmol/L (ref 0.5–1.9)

## 2016-08-15 MED ORDER — ENSURE ENLIVE PO LIQD: 237.0000 mL | Freq: Two times a day (BID) | ORAL | Status: DC

## 2016-08-15 MED ORDER — LACTULOSE 10 GM/15ML PO SOLN
30.0000 g | Freq: Two times a day (BID) | ORAL | Status: DC | PRN
  Filled 2016-08-15: qty 60

## 2016-08-15 MED ORDER — OXYCODONE HCL 5 MG PO TABS
2.5000 mg | ORAL_TABLET | Freq: Four times a day (QID) | ORAL | Status: DC | PRN
  Filled 2016-08-15: qty 1

## 2016-08-15 MED ORDER — ONDANSETRON HCL 4 MG PO TABS: 4.0000 mg | ORAL_TABLET | Freq: Four times a day (QID) | ORAL | Status: DC | PRN

## 2016-08-15 MED ORDER — SODIUM CHLORIDE 0.9 % IV BOLUS (SEPSIS)
1000.0000 mL | Freq: Once | INTRAVENOUS | Status: AC
  Administered 2016-08-15: 1000 mL via INTRAVENOUS

## 2016-08-15 MED ORDER — INSULIN ASPART 100 UNIT/ML ~~LOC~~ SOLN: 3.0000 [IU] | Freq: Three times a day (TID) | SUBCUTANEOUS | Status: DC

## 2016-08-15 MED ORDER — MORPHINE SULFATE (PF) 2 MG/ML IV SOLN
2.0000 mg | Freq: Once | INTRAVENOUS | Status: AC
  Administered 2016-08-15: 2 mg via INTRAVENOUS
  Filled 2016-08-15: qty 1

## 2016-08-15 MED ORDER — FENTANYL CITRATE (PF) 100 MCG/2ML IJ SOLN
50.0000 ug | Freq: Once | INTRAMUSCULAR | Status: AC
  Administered 2016-08-15: 50 ug via INTRAVENOUS
  Filled 2016-08-15: qty 2

## 2016-08-15 MED ORDER — ONDANSETRON HCL 4 MG/2ML IJ SOLN
4.0000 mg | Freq: Four times a day (QID) | INTRAMUSCULAR | Status: DC | PRN
  Administered 2016-08-15 – 2016-08-16 (×2): 4 mg via INTRAVENOUS
  Filled 2016-08-15 (×2): qty 2

## 2016-08-15 MED ORDER — SENNOSIDES-DOCUSATE SODIUM 8.6-50 MG PO TABS: 1.0000 | ORAL_TABLET | Freq: Every evening | ORAL | Status: DC | PRN

## 2016-08-15 MED ORDER — INSULIN ASPART 100 UNIT/ML ~~LOC~~ SOLN
0.0000 [IU] | Freq: Three times a day (TID) | SUBCUTANEOUS | Status: DC
  Administered 2016-08-15: 3 [IU] via SUBCUTANEOUS

## 2016-08-15 MED ORDER — SODIUM CHLORIDE 0.9 % IV SOLN
INTRAVENOUS | Status: DC
  Administered 2016-08-15 – 2016-08-16 (×3): via INTRAVENOUS

## 2016-08-15 NOTE — ED Notes (Signed)
Having difficulty obtaining IV site and labs.

## 2016-08-15 NOTE — ED Notes (Signed)
CRITICAL VALUE ALERT  Critical value received:  Troponin 0.15  Date of notification:  08/29/2016  Time of notification:  0100  Critical value read back:Yes.    Nurse who received alert:  Madelyn Flavors, RN  MD notified (1st page):  Dr Glory Buff, PA-C  Time of first page:  [phone]

## 2016-08-15 NOTE — ED Provider Notes (Signed)
[facility] DEPT Provider Note   CSN: [id number] Arrival date & time: 08/21/2016  0830  By signing my name below, I, Jeanell Sparrow, attest that this documentation has been prepared under the direction and in the presence of Noemi Chapel, MD. Electronically Signed: Jeanell Sparrow, Scribe. 08/28/2016. 8:39 AM.  History   Chief Complaint Chief Complaint  Patient presents with  . Weakness   The history is provided by the patient, a relative and medical records. No language interpreter was used.   HPI Comments: BONITA BRINDISI is a 76 y.o. female with a PMHx of HTN and DM who presents to the Emergency Department complaining of generalized weakness that started 6 days ago. Per EMR cancer notes, she has Stage 4 bile duct cancer and current treatment plan reflects a palliative approach that involves stenting obstructed ducts and avoiding chemoradiation. Common bile duct stent placed on 05/28/16. She reports her cancer has spread to her liver. She has had IV fluids intermittently in provider office (last dose: 08/09/16). She has been having gradually less fluid intake since diagnosis, but daughter reports she has had almost no water intake recently. She was given water today, which she immediately vomited. She has associated fatigue, decreased activity, and mild cough (started today). She denies any pertinent cardiac/pulmonary PMHx, hx of smoking/drinking, hx of illicit drug use, leg swelling, diarrhea, or other complaints at this time.     PCP: Lilian Coma, MD  Past Medical History:  Diagnosis Date  . Cancer 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 Diabetes mellitus without complication ([place])   . Hypertension   . 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 AKI (acute kidney injury) ([place]) 06/20/2016  . Sepsis ([place]) 06/18/2016  . 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 Obstructive jaundice  due to cancer ([place]) 06/18/2016  . Liver mass 05/24/2016  . 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 Gallbladder mass, probable cancer 05/24/2016  . 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 Hyperbilirubinemia 05/22/2016  . Weight loss 05/22/2016  . Loss of appetite 05/22/2016  . Diabetes ([place]) 05/22/2016  . CKD (chronic kidney disease) stage 3, GFR 30-59 ml/min 05/22/2016  . Abnormal vaginal bleeding 05/22/2016  . UTI (urinary tract infection) 05/22/2016  . DYSPEPSIA 10/29/2006  . COUGH 09/28/2006  . URI 09/18/2006  . COLONIC POLYPS 07/23/2006  . Uncontrolled diabetes mellitus ([place]) 07/23/2006  . HYPERLIPIDEMIA 04/09/2006  . Essential hypertension 04/09/2006  . CONSTIPATION 04/09/2006  . OVERACTIVE BLADDER 04/09/2006  . OSTEOARTHRITIS 04/09/2006  . OSTEOPOROSIS 04/09/2006    Past Surgical History:  Procedure Laterality Date  . CYSTECTOMY    . ERCP N/A 05/28/2016   Procedure: ENDOSCOPIC RETROGRADE CHOLANGIOPANCREATOGRAPHY (ERCP);  Surgeon: Arta Silence, MD;  Location: [facility];  Service: Endoscopy;  Laterality: N/A;  . TUBAL LIGATION      OB History    No data available       Home Medications    Prior to Admission medications   Medication Sig Start Date End Date Taking? Authorizing Provider  feeding supplement, ENSURE ENLIVE, (ENSURE ENLIVE) LIQD Take 237 mLs by mouth 2 (two) times daily between meals. 05/30/16  Yes Silver Huguenin Elgergawy, MD  HUMALOG MIX 50/50 KWIKPEN (50-50) 100 UNIT/ML Kwikpen Inject 10-34 Units into the skin 2 (two) times daily as needed (high blood sugar). Inject 34 units under the skin before breakfast and 10 units under the skin before  evening meal. 05/13/16  Yes Historical Provider, MD  lactulose (CHRONULAC) 10 GM/15ML solution Take 45 mLs (30 g total) by mouth 2 (two) times daily as needed for mild constipation. 08/08/16  Yes Ladell Pier, MD  oxyCODONE (OXY IR/ROXICODONE) 5 MG immediate release tablet Take 0.5-1 tablets  (2.5-5 mg total) by mouth every 6 (six) hours as needed for severe pain. 08/08/16  Yes Ladell Pier, MD  amLODipine (NORVASC) 10 MG tablet Take 1 tablet (10 mg total) by mouth daily. Patient not taking: Reported on 08/24/2016 06/22/16   Velta Addison Mikhail, DO  ferrous sulfate 325 (65 FE) MG EC tablet Take 1 tablet (325 mg total) by mouth 2 (two) times daily with a meal. Patient not taking: Reported on 07/22/2016 06/04/16   Ladell Pier, MD    Family History Family History  Problem Relation Age of Onset  . Cancer Mother   . Diabetes Father     Social History Social History  Substance Use Topics  . Smoking status: Former Research scientist (life sciences)  . Smokeless tobacco: Never Used  . Alcohol use No     Allergies   Rosiglitazone maleate; Metformin; Penicillins; and Pioglitazone   Review of Systems Review of Systems  Constitutional: Positive for activity change and fatigue.  Respiratory: Positive for cough (Mild).   Cardiovascular: Negative for leg swelling.  Gastrointestinal: Positive for vomiting (Once post water consumption). Negative for diarrhea.  Neurological: Positive for weakness (Generalized).  All other systems reviewed and are negative.    Physical Exam Updated Vital Signs BP 114/67 (BP Location: Right Arm)   Pulse 97   Temp 97.4 F (36.3 C) (Oral)   Resp 20   Ht 5\' 7"  (1.702 m)   Wt 145 lb (65.8 kg)   SpO2 100%   BMI 22.71 kg/m   Physical Exam  Constitutional: She appears well-developed and well-nourished. No distress.  HENT:  Head: Normocephalic and atraumatic.  Mouth/Throat: Oropharynx is clear and moist. No oropharyngeal exudate.  Eyes: Conjunctivae and EOM are normal. Pupils are equal, round, and reactive to light. Right eye exhibits no discharge. Left eye exhibits no discharge. No scleral icterus.  Scleral icterus is jaundiced.   Neck: Normal range of motion. Neck supple. No JVD present. No thyromegaly present.  Cardiovascular: Normal rate, regular rhythm, normal heart  sounds and intact distal pulses.  Exam reveals no gallop and no friction rub.   No murmur heard. Weak radial pulses bilaterally.   Pulmonary/Chest: Effort normal. No respiratory distress. She has no wheezes. She has rales.  Slight rales at the right base.   Abdominal: Soft. Bowel sounds are normal. She exhibits no distension and no mass. There is no tenderness.  Diffusely soft and non tender without masses.   Musculoskeletal: Normal range of motion. She exhibits no edema or tenderness.  Lymphadenopathy:    She has no cervical adenopathy.  Neurological: She is alert. Coordination normal.  Generalized weakness, but able to follow commands. Cranial nerves 3-12 are normal. Speech is weak but clear.   Skin: Skin is warm and dry. No rash noted. No erythema.  Psychiatric: She has a normal mood and affect. Her behavior is normal.  Nursing note and vitals reviewed.    ED Treatments / Results  DIAGNOSTIC STUDIES: Oxygen Saturation is 100% on RA, normal by my interpretation.    COORDINATION OF CARE: 8:43 AM- Pt advised of plan for treatment and pt agrees.  Labs (all labs ordered are listed, but only abnormal results are displayed) Labs Reviewed  CBC WITH DIFFERENTIAL/PLATELET - Abnormal; Notable for the following:       Result Value   WBC 15.4 (*)    RBC 3.38 (*)    Hemoglobin 9.8 (*)    HCT 28.3 (*)    RDW 16.5 (*)    Neutro Abs 13.1 (*)    All other components within normal limits  COMPREHENSIVE METABOLIC PANEL - Abnormal; Notable for the following:    Sodium 125 (*)    Potassium 3.3 (*)    Chloride 86 (*)    CO2 14 (*)    Glucose, Bld 259 (*)    BUN 128 (*)    Creatinine, Ser 9.36 (*)    Calcium 7.6 (*)    Albumin 2.1 (*)    AST 118 (*)    ALT 89 (*)    Alkaline Phosphatase 941 (*)    Total Bilirubin 14.7 (*)    GFR calc non Af Amer 4 (*)    GFR calc Af Amer 4 (*)    All other components within normal limits  URINALYSIS, ROUTINE W REFLEX MICROSCOPIC - Abnormal; Notable  for the following:    Color, Urine AMBER (*)    APPearance CLOUDY (*)    Glucose, UA 50 (*)    Hgb urine dipstick SMALL (*)    Bilirubin Urine SMALL (*)    Protein, ur 30 (*)    Leukocytes, UA TRACE (*)    Bacteria, UA RARE (*)    Squamous Epithelial / LPF 0-5 (*)    Non Squamous Epithelial 0-5 (*)    All other components within normal limits  TROPONIN I - Abnormal; Notable for the following:    Troponin I 0.15 (*)    All other components within normal limits  CBG MONITORING, ED - Abnormal; Notable for the following:    Glucose-Capillary 249 (*)    All other components within normal limits  URINE CULTURE  LIPASE, BLOOD  I-STAT CG4 LACTIC ACID, ED  I-STAT CG4 LACTIC ACID, ED    EKG  EKG Interpretation  Date/Time:  Friday August 15 2016 09:24:32 EDT Ventricular Rate:  91 PR Interval:    QRS Duration: 120 QT Interval:  404 QTC Calculation: 498 R Axis:   32 Text Interpretation:  Sinus rhythm Nonspecific intraventricular conduction delay since last tracing no significant change Confirmed by Sabra Heck  MD, Anja Neuzil ([id number]) on 09/01/2016 11:51:21 AM       Radiology Dg Chest Port 1 View  Result Date: 08/17/2016 CLINICAL DATA:  Jaundice. Increasing weakness and decreased p.o. intake over the past week. EXAM: PORTABLE CHEST 1 VIEW COMPARISON:  PA and lateral chest 06/17/2005.  CT chest 05/27/2016. FINDINGS: Small focus of atelectasis is present in the right lower lobe. Lungs are otherwise clear. Heart size is normal. No pneumothorax or pleural effusion. Aortic atherosclerosis is noted. IMPRESSION: No acute disease. Small focus of subsegmental atelectasis right lower lobe noted. Atherosclerosis. Electronically Signed   By: Inge Rise M.D.   On: 08/28/2016 09:16    Procedures Procedures (including critical care time)  Medications Ordered in ED Medications  fentaNYL (SUBLIMAZE) injection 50 mcg (not administered)  sodium chloride 0.9 % bolus 1,000 mL (1,000 mLs Intravenous New  Bag/Given 09/01/2016 1011)  morphine 2 MG/ML injection 2 mg (2 mg Intravenous Given 08/31/2016 1047)     Initial Impression / Assessment and Plan / ED Course  I have reviewed the triage vital signs and the nursing notes.  Pertinent labs & imaging results that were available  during my care of the patient were reviewed by me and considered in my medical decision making (see chart for details).  Clinical Course as of Aug 15 1220  Fri Aug 15, 2016  1150 Creatinine: (!) 9.36 [BM]    Clinical Course User Index [BM] Noemi Chapel, MD    The patient is critically ill with multiple abnormalities including a severe elevation in creatinine which has been progressive and unfortunately is unlikely to turn around. She has had an elevation of troponin which is likely related to the renal failure. She has a hyponatremia and his severe dehydration related to poor oral intake. At this time the patient was offered admission to the hospital for ongoing hydration. She will need more aggressive palliative care discussion with the family. She is at this time a DO NOT RESUSCITATE according to the daughter who was in the room.  Discussed with Dr. Jerilee Hoh who will admit.  Final Clinical Impressions(s) / ED Diagnoses   Final diagnoses:  Weakness  Dehydration  Acute renal failure superimposed on stage 4 chronic kidney disease, unspecified acute renal failure type [facility])    New Prescriptions New Prescriptions   No medications on file   I personally performed the services described in this documentation, which was scribed in my presence. The recorded information has been reviewed and is accurate.        Noemi Chapel, MD 09/02/2016 [phone]

## 2016-08-15 NOTE — Consult Note (Signed)
Consultation Note Date: 09/01/2016   [name]  DOB: 1940-09-06  MRN: [id number]  Age / Sex: 76 y.o., female  PCP: Jonathon Jordan, MD Referring Physician: Koleen Nimrod Acost*  Reason for Consultation: Establishing goals of care and Psychosocial/spiritual support  HPI/Fisher Profile: 76 y.o. 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   Clinical Assessment and Goals of Care: [name] is sitting up in bed. She is able to make and keep eye contact. Present at bedside today is husband [name], daughter [name] from [place].  Daughter [name] does most of the talking initially. She states that her mother has seen Dr. Benay Spice oncologist in [place] and also Dr. Truman Hayward oncologist at [facility]. She states that her mother has not been eating much over the last week, only drinking. She states that [name] has lost 40 pounds since her new diagnosis January 2018.  [name] states that they saw nephrologist, Dr. Mercy Moore on Wednesday, and he shared that [name] is not a candidate for hemodialysis. [name] states that they were told if [name] kidney failure was related to IV die than she could regain some kidney function. During our conversation, [name] remains quiet.  I ask about hospice services, and that Dr. Stephanie Acre has been requested to be primary attending. Family states that Dr. Stephanie Acre has been [name] PCP since approximately 2012. [name] states that hospice came out this week, but they chose to not go with hospice services because they were told that hospice does not provide IV fluids. [name] states that hospice told them home  health would give IV fluids. I share with the family that home health services do not generally provide IV fluids. [name] states that this is only the 2nd time that her mother has gotten IV fluids. She states that the goal is for her mother to "get enough energy to go to [place]". [name] states that there is a specialist in [place] she wants her mother to see. During this conversation, [name] remains silent.  [name] stated that her mother does not want to die in the hospital, [name] agreed.  Healthcare power of attorney NEXT OF KIN - [name] she states she wants her daughter [name] to make health care choices if she is unable   SUMMARY OF RECOMMENDATIONS   at this point, continue to treat the treatable, continue end-of-life discussions, continue to focus on [name] wishes.   Code Status/Advance Care Planning:  DNR  Symptom Management:   per hospitalist, no additional needs at this time  Palliative Prophylaxis:   Frequent Pain Assessment and Turn Reposition  Additional Recommendations (Limitations, Scope, Preferences):  Continue to treat the treatable but no CPR or intubation  Psycho-social/Spiritual:   Desire for further Chaplaincy support:no  Additional Recommendations: Education on Hospice  Prognosis:   <  4 weeks, likely based on 3 hospitalization since January, 40 p.m. weight loss since January, dehydration failure to thrive  Discharge Planning: Home with Home Health      Primary Diagnoses: Present on Admission: . Cholangiocarcinoma ([place])   I have reviewed the medical record, interviewed the Fisher and family, and examined the Fisher. The following aspects are pertinent.  Past Medical History:  Diagnosis Date  . Cancer [facility])    gallbladder stage IV (Jan 2018)  . Diabetes mellitus without complication ([place])   . Hypertension   . Rheumatoid osteoperiostitis ([place])    Social History   Social History  . Marital status:  Married    Spouse name: N/A  . Number of children: N/A  . Years of education: N/A   Social History Main Topics  . Smoking status: Former Research scientist (life sciences)  . Smokeless tobacco: Never Used  . Alcohol use No  . Drug use: No  . Sexual activity: Not Asked   Other Topics Concern  . None   Social History Narrative  . None   Family History  Problem Relation Age of Onset  . Cancer Mother   . Diabetes Father    Scheduled Meds: Continuous Infusions: PRN Meds:. Medications Prior to Admission:  Prior to Admission medications   Medication Sig Start Date End Date Taking? Authorizing Provider  feeding supplement, ENSURE ENLIVE, (ENSURE ENLIVE) LIQD Take 237 mLs by mouth 2 (two) times daily between meals. 05/30/16  Yes Silver Huguenin Elgergawy, MD  HUMALOG MIX 50/50 KWIKPEN (50-50) 100 UNIT/ML Kwikpen Inject 10-34 Units into the skin 2 (two) times daily as needed (high blood sugar). Inject 34 units under the skin before breakfast and 10 units under the skin before evening meal. 05/13/16  Yes Historical Provider, MD  lactulose (CHRONULAC) 10 GM/15ML solution Take 45 mLs (30 g total) by mouth 2 (two) times daily as needed for mild constipation. 08/08/16  Yes Ladell Pier, MD  oxyCODONE (OXY IR/ROXICODONE) 5 MG immediate release tablet Take 0.5-1 tablets (2.5-5 mg total) by mouth every 6 (six) hours as needed for severe pain. 08/08/16  Yes Ladell Pier, MD  amLODipine (NORVASC) 10 MG tablet Take 1 tablet (10 mg total) by mouth daily. Fisher not taking: Reported on 08/17/2016 06/22/16   Velta Addison Mikhail, DO  ferrous sulfate 325 (65 FE) MG EC tablet Take 1 tablet (325 mg total) by mouth 2 (two) times daily with a meal. Fisher not taking: Reported on 07/22/2016 06/04/16   Ladell Pier, MD   Allergies  Allergen Reactions  . Rosiglitazone Maleate Other (See Comments)    couldn't sleep  . Metformin Diarrhea  . Penicillins Hives and Rash    Has Fisher had a PCN reaction causing immediate rash, facial/tongue/throat  swelling, SOB or lightheadedness with hypotension: yes Has Fisher had a PCN reaction causing severe rash involving mucus membranes or skin necrosis: no Has Fisher had a PCN reaction that required hospitalization:no Has Fisher had a PCN reaction occurring within the last 10 years: no If all of the above answers are "NO", then may proceed with Cephalosporin use.  . Pioglitazone Other (See Comments)    Joint pain    Review of Systems  Unable to perform ROS: Other    Physical Exam  Constitutional: She is oriented to person, place, and time. No distress.  Frail and thin, chronically ill appearing  HENT:  Head: Normocephalic and atraumatic.  Temporal wasting  Cardiovascular: Normal rate and regular rhythm.   Pulmonary/Chest: Effort normal. No respiratory  distress.  Abdominal: Soft. She exhibits no distension.  Musculoskeletal: She exhibits no edema.  Neurological: She is alert and oriented to person, place, and time.  Skin: Skin is warm and dry.  Nursing note and vitals reviewed.   Vital Signs: BP 125/70 (BP Location: Left Arm)   Pulse 91   Temp 97.3 F (36.3 C) (Axillary)   Resp 16   Ht 5\' 7"  (1.702 m)   Wt 65.8 kg (145 lb)   SpO2 100%   BMI 22.71 kg/m  Pain Assessment: No/denies pain   Pain Score: 0-No pain   SpO2: SpO2: 100 % O2 Device:SpO2: 100 % O2 Flow Rate: .   IO: Intake/output summary: No intake or output data in the 24 hours ending 08/19/2016 1532  LBM: Last BM Date: 08/14/16 Baseline Weight: Weight: 65.8 kg (145 lb) Most recent weight: Weight: 65.8 kg (145 lb)     Palliative Assessment/Data:   Flowsheet Rows     Most Recent Value  Intake Tab  Referral Department  Hospitalist  Unit at Time of Referral  ER  Palliative Care Primary Diagnosis  Cancer  Date Notified  08/19/2016  Palliative Care Type  New Palliative care  Reason for referral  Clarify Goals of Care  Date of Admission  08/21/2016  Date first seen by Palliative Care  08/11/2016  # of days  Palliative referral response time  0 Day(s)  # of days IP prior to Palliative referral  0  Clinical Assessment  Psychosocial & Spiritual Assessment  Palliative Care Outcomes  Fisher/Family meeting held?  Yes  Who was at the meeting?  pt, husband [name], daughter Charlene  Palliative Care Outcomes  Clarified goals of care, Provided psychosocial or spiritual support, Changed CPR status  Fisher/Family wishes: Interventions discontinued/not started   Mechanical Ventilation      Time In: 1430 Time Out: 1505 Time Total: 35 minutes Greater than 50%  of this time was spent counseling and coordinating care related to the above assessment and plan.  Signed by: Evelyn Novel, NP   Please contact Palliative Medicine Team phone at [phone] for questions and concerns.  For individual provider: See Shea Evans

## 2016-08-15 NOTE — H&P (Signed)
History and Physical    KARIA EHRESMAN UXL:[id number] DOB: 30-Dec-1940 DOA: 08/21/2016  Referring MD/NP/PA: Noemi Chapel, EDP PCP: Lilian Coma, MD  Patient coming from: Home  Chief Complaint: Daughter says she needs IVF  HPI: [place] B Hornsby is a 76 y.o. 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 Onc recommended hospice care but daughter refused as hospice was not willing to give IVF. She is currently not in pain. When I ask why she came to the hospital one of the daughters says: "I cannot watch her stay at home and die of dehydration and starvation so I brought her in to get IVF". W/u in the ED is c/w multiple electrolyte abnormalities: Na 125, K 3.3, Co2 14, BUN 128, Cr 9.36, transaminitis, trop 0.15, WBC 15.4, CXR without changes. Admission requested.  Past Medical/Surgical History: Past Medical History:  Diagnosis Date  . 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 Diabetes mellitus without complication ([place])   . Hypertension   . 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 CYSTECTOMY    . ERCP N/A 05/28/2016   Procedure: ENDOSCOPIC RETROGRADE CHOLANGIOPANCREATOGRAPHY (ERCP);  Surgeon: Arta Silence, MD;  Location: [facility];  Service: Endoscopy;  Laterality: N/A;  . TUBAL LIGATION      Social History:  reports that she has quit smoking. She has never used smokeless tobacco. She reports that she does not drink alcohol or use drugs.  Allergies: Allergies  Allergen Reactions  . Rosiglitazone Maleate Other (See Comments)    couldn't sleep  . Metformin Diarrhea  . Penicillins Hives and Rash    Has patient had a PCN reaction causing immediate rash, facial/tongue/throat swelling, SOB or lightheadedness with hypotension: yes Has patient had a PCN reaction causing severe rash involving mucus membranes or skin necrosis:  no Has patient had a PCN reaction that required hospitalization:no Has patient had a PCN reaction occurring within the last 10 years: no If all of the above answers are "NO", then may proceed with Cephalosporin use.  . Pioglitazone Other (See Comments)    Joint pain     Family History:  Family History  Problem Relation Age of Onset  . Cancer Mother   . Diabetes Father     Prior to Admission medications   Medication Sig Start Date End Date Taking? Authorizing Provider  feeding supplement, ENSURE ENLIVE, (ENSURE ENLIVE) LIQD Take 237 mLs by mouth 2 (two) times daily between meals. 05/30/16  Yes Silver Huguenin Elgergawy, MD  HUMALOG MIX 50/50 KWIKPEN (50-50) 100 UNIT/ML Kwikpen Inject 10-34 Units into the skin 2 (two) times daily as needed (high blood sugar). Inject 34 units under the skin before breakfast and 10 units under the skin before evening meal. 05/13/16  Yes Historical Provider, MD  lactulose (CHRONULAC) 10 GM/15ML solution Take 45 mLs (30 g total) by mouth 2 (two) times daily as needed for mild constipation. 08/08/16  Yes Ladell Pier, MD  oxyCODONE (OXY IR/ROXICODONE) 5 MG immediate release tablet Take 0.5-1 tablets (2.5-5 mg total) by mouth every 6 (six) hours as needed for severe pain. 08/08/16  Yes Ladell Pier, MD  amLODipine (NORVASC) 10 MG tablet Take 1 tablet (10 mg total) by mouth daily. Patient not taking: Reported on 08/27/2016 06/22/16   Velta Addison Mikhail, DO  ferrous sulfate 325 (65 FE) MG EC tablet Take 1 tablet (  325 mg total) by mouth 2 (two) times daily with a meal. Patient not taking: Reported on 07/22/2016 06/04/16   Ladell Pier, MD    Review of Systems:  Constitutional: Denies fever, chills, diaphoresis, positive for appetite change and fatigue.  HEENT: Denies photophobia, eye pain, redness, hearing loss, ear pain, congestion, sore throat, rhinorrhea, sneezing, mouth sores, trouble swallowing, neck pain, neck stiffness and tinnitus.   Respiratory: Denies SOB, DOE,  cough, chest tightness,  and wheezing.   Cardiovascular: Denies chest pain, palpitations and leg swelling.  Gastrointestinal: Denies nausea, vomiting, abdominal pain, diarrhea, constipation, blood in stool and abdominal distention.  Genitourinary: Denies dysuria, urgency, frequency, hematuria, flank pain and difficulty urinating.  Endocrine: Denies: hot or cold intolerance, sweats, changes in hair or nails, polyuria, polydipsia. Musculoskeletal: Denies myalgias, back pain, joint swelling, arthralgias and gait problem.  Skin: Denies pallor, rash and wound.  Neurological: Denies dizziness, seizures, syncope, weakness, light-headedness, numbness and headaches.  Hematological: Denies adenopathy. Easy bruising, personal or family bleeding history  Psychiatric/Behavioral: Denies suicidal ideation, mood changes, confusion, nervousness, sleep disturbance and agitation    Physical Exam: Vitals:   09/08/2016 1230 09/06/2016 1300 08/19/2016 1330 09/03/2016 1430  BP: 125/66 124/66 132/76 125/70  Pulse: 89 89 92 91  Resp: 12 12 13 16   Temp:    97.3 F (36.3 C)  TempSrc:    Axillary  SpO2: 99% 99% 100% 100%  Weight:      Height:    5\' 7"  (1.702 m)     Constitutional: NAD, calm, comfortable, cachectic Eyes: PERRL, lids and conjunctivae normal ENMT: Mucous membranes are dry. Posterior pharynx clear of any exudate or lesions.Normal dentition.  Neck: normal, supple, no masses, no thyromegaly Respiratory: clear to auscultation bilaterally, no wheezing, no crackles. Normal respiratory effort. No accessory muscle use.  Cardiovascular: Regular rate and rhythm, no murmurs / rubs / gallops. No extremity edema. 2+ pedal pulses. No carotid bruits.  Abdomen: no tenderness, no masses palpated. No hepatosplenomegaly. Bowel sounds positive.  Musculoskeletal: no clubbing / cyanosis. No joint deformity upper and lower extremities. Good ROM, no contractures. Normal muscle tone.  Skin: no rashes, lesions, ulcers. No  induration Neurologic: CN 2-12 grossly intact. Sensation intact, DTR normal. Strength 5/5 in all 4.  Psychiatric: Normal judgment and insight. Alert and oriented x 3. Normal mood.    Labs on Admission: I have personally reviewed the following labs and imaging studies  CBC:  Recent Labs Lab 09/06/2016 0945  WBC 15.4*  NEUTROABS 13.1*  HGB 9.8*  HCT 28.3*  MCV 83.7  PLT 676   Basic Metabolic Panel:  Recent Labs Lab 08/11/16 1208 09/04/2016 0945  NA 134* 125*  K 3.6 3.3*  CL  --  86*  CO2 19* 14*  GLUCOSE 317* 259*  BUN 93.1* 128*  CREATININE 8.4* 9.36*  CALCIUM 9.0 7.6*   GFR: Estimated Creatinine Clearance: 5.1 mL/min (A) (by C-G formula based on SCr of 9.36 mg/dL (H)). Liver Function Tests:  Recent Labs Lab 08/11/16 1208 08/12/2016 0945  AST 145* 118*  ALT 113* 89*  ALKPHOS 1,117* 941*  BILITOT 13.00* 14.7*  PROT 7.4 7.3  ALBUMIN 2.0* 2.1*    Recent Labs Lab 09/07/2016 0945  LIPASE 15   No results for input(s): AMMONIA in the last 168 hours. Coagulation Profile: No results for input(s): INR, PROTIME in the last 168 hours. Cardiac Enzymes:  Recent Labs Lab 08/16/2016 0945  TROPONINI 0.15*   BNP (last 3 results) No results for input(s): PROBNP  in the last 8760 hours. HbA1C: No results for input(s): HGBA1C in the last 72 hours. CBG:  Recent Labs Lab 08/22/2016 0921  GLUCAP 249*   Lipid Profile: No results for input(s): CHOL, HDL, LDLCALC, TRIG, CHOLHDL, LDLDIRECT in the last 72 hours. Thyroid Function Tests: No results for input(s): TSH, T4TOTAL, FREET4, T3FREE, THYROIDAB in the last 72 hours. Anemia Panel: No results for input(s): VITAMINB12, FOLATE, FERRITIN, TIBC, IRON, RETICCTPCT in the last 72 hours. Urine analysis:    Component Value Date/Time   COLORURINE AMBER (A) 08/12/2016 0856   APPEARANCEUR CLOUDY (A) 08/19/2016 0856   LABSPEC 1.013 08/19/2016 0856   PHURINE 5.0 09/04/2016 0856   GLUCOSEU 50 (A) 08/24/2016 0856   HGBUR SMALL (A)  08/23/2016 0856   BILIRUBINUR SMALL (A) 08/28/2016 0856   KETONESUR NEGATIVE 09/07/2016 0856   PROTEINUR 30 (A) 09/02/2016 0856   UROBILINOGEN 0.2 12/03/2013 2155   NITRITE NEGATIVE 08/19/2016 0856   LEUKOCYTESUR TRACE (A) 08/10/2016 0856   Sepsis Labs: @LABRCNTIP (procalcitonin:4,lacticidven:4) )No results found for this or any previous visit (from the past 240 hour(s)).   Radiological Exams on Admission: Dg Chest Port 1 View  Result Date: 09/02/2016 CLINICAL DATA:  Jaundice. Increasing weakness and decreased p.o. intake over the past week. EXAM: PORTABLE CHEST 1 VIEW COMPARISON:  PA and lateral chest 06/17/2005.  CT chest 05/27/2016. FINDINGS: Small focus of atelectasis is present in the right lower lobe. Lungs are otherwise clear. Heart size is normal. No pneumothorax or pleural effusion. Aortic atherosclerosis is noted. IMPRESSION: No acute disease. Small focus of subsegmental atelectasis right lower lobe noted. Atherosclerosis. Electronically Signed   By: Inge Rise M.D.   On: 08/14/2016 09:16    EKG: Independently reviewed. NSR, no acute ischemic changes  Assessment/Plan Principal Problem:   Cholangiocarcinoma (HCC) Active Problems:   Goals of care, counseling/discussion   DNR (do not resuscitate) discussion   Palliative care encounter   CKD (chronic kidney disease) stage 5, GFR less than 15 ml/min (HCC)    Cholangiocarcinoma -With adult FTT. -Have explained to family members how this will become a vicious cycle. They are under the impression that IVF will give her "energy" for a few days. -They have also talked with palliative care today. They seem to understand the severity of patient's prognosis, but they find it very difficult "to just let her starve to death". Have explained to them how during the natural process of dying the body shuts down and decreased PO intake is seen. -They are adamant in their wishes for continued IVF overnight. Suspect it will take a few  cycles of hospitalization for them to come to grips with their mother's terminal condition.  Acute on Chronic Stage IV CKD -Baseline Cr around 7-8 which has worsened due to prolonged decreased PO intake. -They have already seen Dr. Mercy Moore in [facility], who has stated in no unclear terms, that patient is not a candidate for dialysis.  DM -Check A1C. -Place on SSI. -hold 50/50 give decreased PO intake.     DVT prophylaxis: None, given we are close to palliation and end of life care  Code Status: DNR  Family Communication: multiple family members at bedside including 2 daughters and son  Disposition Plan: Home in 24-48 hours  Consults called: Palliative care  Admission status: observation    Time Spent: 75 minutes  Lelon Frohlich MD [facility] Pager [phone]  If 7PM-7AM, please contact night-coverage [email or web address] Password [email or web address]  08/22/2016, 4:20 PM

## 2016-08-15 NOTE — ED Triage Notes (Signed)
PT c/o increased generalized weakness nearly no PO intake over the past week. Jaundice noted to bilateral eyes. PT states she has stage IV gallbladder cancer and has had 3 stents placed by [facility].

## 2016-08-15 NOTE — ED Notes (Signed)
Lab obtained.  Still no IV site.

## 2016-08-16 DIAGNOSIS — E1122 Type 2 diabetes mellitus with diabetic chronic kidney disease: Secondary | ICD-10-CM | POA: Diagnosis present

## 2016-08-16 DIAGNOSIS — R531 Weakness: Secondary | ICD-10-CM | POA: Diagnosis present

## 2016-08-16 DIAGNOSIS — N179 Acute kidney failure, unspecified: Secondary | ICD-10-CM | POA: Diagnosis not present

## 2016-08-16 DIAGNOSIS — Z880 Allergy status to penicillin: Secondary | ICD-10-CM | POA: Diagnosis not present

## 2016-08-16 DIAGNOSIS — M868X9 Other osteomyelitis, unspecified sites: Secondary | ICD-10-CM | POA: Diagnosis present

## 2016-08-16 DIAGNOSIS — Z66 Do not resuscitate: Secondary | ICD-10-CM | POA: Diagnosis present

## 2016-08-16 DIAGNOSIS — R64 Cachexia: Secondary | ICD-10-CM | POA: Diagnosis present

## 2016-08-16 DIAGNOSIS — E860 Dehydration: Secondary | ICD-10-CM | POA: Diagnosis present

## 2016-08-16 DIAGNOSIS — Z833 Family history of diabetes mellitus: Secondary | ICD-10-CM | POA: Diagnosis not present

## 2016-08-16 DIAGNOSIS — I120 Hypertensive chronic kidney disease with stage 5 chronic kidney disease or end stage renal disease: Secondary | ICD-10-CM | POA: Diagnosis present

## 2016-08-16 DIAGNOSIS — M810 Age-related osteoporosis without current pathological fracture: Secondary | ICD-10-CM | POA: Diagnosis present

## 2016-08-16 DIAGNOSIS — E871 Hypo-osmolality and hyponatremia: Secondary | ICD-10-CM | POA: Diagnosis present

## 2016-08-16 DIAGNOSIS — Z515 Encounter for palliative care: Secondary | ICD-10-CM | POA: Diagnosis not present

## 2016-08-16 DIAGNOSIS — C221 Intrahepatic bile duct carcinoma: Secondary | ICD-10-CM | POA: Diagnosis not present

## 2016-08-16 DIAGNOSIS — Z809 Family history of malignant neoplasm, unspecified: Secondary | ICD-10-CM | POA: Diagnosis not present

## 2016-08-16 DIAGNOSIS — G9340 Encephalopathy, unspecified: Secondary | ICD-10-CM | POA: Diagnosis present

## 2016-08-16 DIAGNOSIS — Z6822 Body mass index (BMI) 22.0-22.9, adult: Secondary | ICD-10-CM | POA: Diagnosis not present

## 2016-08-16 DIAGNOSIS — N185 Chronic kidney disease, stage 5: Secondary | ICD-10-CM | POA: Diagnosis not present

## 2016-08-16 DIAGNOSIS — Z794 Long term (current) use of insulin: Secondary | ICD-10-CM | POA: Diagnosis not present

## 2016-08-16 DIAGNOSIS — R627 Adult failure to thrive: Secondary | ICD-10-CM | POA: Diagnosis present

## 2016-08-16 DIAGNOSIS — Z7189 Other specified counseling: Secondary | ICD-10-CM | POA: Diagnosis not present

## 2016-08-16 DIAGNOSIS — Z888 Allergy status to other drugs, medicaments and biological substances status: Secondary | ICD-10-CM | POA: Diagnosis not present

## 2016-08-16 DIAGNOSIS — Z87891 Personal history of nicotine dependence: Secondary | ICD-10-CM | POA: Diagnosis not present

## 2016-08-16 LAB — HEMOGLOBIN A1C
HEMOGLOBIN A1C: 7.1 % — AB (ref 4.8–5.6)
Mean Plasma Glucose: 157 mg/dL

## 2016-08-16 LAB — GLUCOSE, CAPILLARY
GLUCOSE-CAPILLARY: 181 mg/dL — AB (ref 65–99)
Glucose-Capillary: 235 mg/dL — ABNORMAL HIGH (ref 65–99)

## 2016-08-16 MED ORDER — ACETAMINOPHEN 650 MG RE SUPP
650.0000 mg | RECTAL | Status: DC | PRN
  Filled 2016-08-16: qty 1

## 2016-08-16 MED ORDER — HYDROMORPHONE HCL 1 MG/ML IJ SOLN
1.0000 mg | INTRAMUSCULAR | Status: DC | PRN
  Administered 2016-08-16 – 2016-08-18 (×6): 1 mg via INTRAVENOUS
  Filled 2016-08-16 (×6): qty 1

## 2016-08-16 MED ORDER — SCOPOLAMINE 1 MG/3DAYS TD PT72
1.0000 | MEDICATED_PATCH | TRANSDERMAL | Status: DC
  Administered 2016-08-16 – 2016-08-19 (×2): 1.5 mg via TRANSDERMAL
  Filled 2016-08-16 (×3): qty 1

## 2016-08-16 MED ORDER — LORAZEPAM 2 MG/ML IJ SOLN: 1.0000 mg | INTRAMUSCULAR | Status: DC | PRN

## 2016-08-16 NOTE — Progress Notes (Signed)
PROGRESS NOTE    GENELLA BAS  JAS:[id number] DOB: [birth year] DOA: 08/24/2016 PCP: Lilian Coma, MD     Brief Narrative:  76 y/o woman with cholangiocarcinoma who per oncology is not a candidate for chemotherapy, now with advanced stage V CKD, brought in for dehydration and multiple electrolyte abnormalities.   Assessment & Plan:   Principal Problem:   Cholangiocarcinoma ([place]) Active Problems:   Goals of care, counseling/discussion   DNR (do not resuscitate) discussion   Palliative care encounter   CKD (chronic kidney disease) stage 5, GFR less than 15 ml/min (HCC)   Cholangiocarcinoma/CKD Stage V/Adult FTT -She has now become unresponsive. -She appears comfortable. -Family is understandably tearful and grieving. -We are transitioning to full comfort care today and I anticipate a hospital death.   DVT prophylaxis: None Code Status: DNR Family Communication: Multiple family members at bedside Disposition Plan: anticipate hospital death  Consultants:   None  Procedures:   None  Antimicrobials:  Anti-infectives    None       Subjective: Unresponsive  Objective: Vitals:   08/22/2016 1430 08/28/2016 2230 08/16/16 0245 08/16/16 0638  BP: 125/70 (!) 96/36 (!) 105/51 (!) 92/44  Pulse: 91 94 99 98  Resp: 16 16 12 10   Temp: 97.3 F (36.3 C) 98.4 F (36.9 C)  98.8 F (37.1 C)  TempSrc: Axillary Oral  Oral  SpO2: 100% 100%  100%  Weight:      Height: 5\' 7"  (1.702 m)       Intake/Output Summary (Last 24 hours) at 08/16/16 1453 Last data filed at 08/16/16 0400  Gross per 24 hour  Intake          1458.34 ml  Output                0 ml  Net          1458.34 ml   Filed Weights   08/12/2016 0847  Weight: 65.8 kg (145 lb)    Examination:  Deferred as she is unresponsive and anticipate death.     Data Reviewed: I have personally reviewed following labs and imaging studies  CBC:  Recent Labs Lab 08/10/2016 0945  WBC 15.4*  NEUTROABS 13.1*    HGB 9.8*  HCT 28.3*  MCV 83.7  PLT 419   Basic Metabolic Panel:  Recent Labs Lab 08/11/16 1208 08/11/2016 0945  NA 134* 125*  K 3.6 3.3*  CL  --  86*  CO2 19* 14*  GLUCOSE 317* 259*  BUN 93.1* 128*  CREATININE 8.4* 9.36*  CALCIUM 9.0 7.6*   GFR: Estimated Creatinine Clearance: 5.1 mL/min (A) (by C-G formula based on SCr of 9.36 mg/dL (H)). Liver Function Tests:  Recent Labs Lab 08/11/16 1208 08/13/2016 0945  AST 145* 118*  ALT 113* 89*  ALKPHOS 1,117* 941*  BILITOT 13.00* 14.7*  PROT 7.4 7.3  ALBUMIN 2.0* 2.1*    Recent Labs Lab 08/24/2016 0945  LIPASE 15   No results for input(s): AMMONIA in the last 168 hours. Coagulation Profile: No results for input(s): INR, PROTIME in the last 168 hours. Cardiac Enzymes:  Recent Labs Lab 09/05/2016 0945  TROPONINI 0.15*   BNP (last 3 results) No results for input(s): PROBNP in the last 8760 hours. HbA1C:  Recent Labs  08/14/2016 0927  HGBA1C 7.1*   CBG:  Recent Labs Lab 08/12/2016 0921 08/11/2016 1701 09/01/2016 2158 08/16/16 0743  GLUCAP 249* 249* 209* 181*   Lipid Profile: No results for input(s): CHOL, HDL, LDLCALC, TRIG,  CHOLHDL, LDLDIRECT in the last 72 hours. Thyroid Function Tests: No results for input(s): TSH, T4TOTAL, FREET4, T3FREE, THYROIDAB in the last 72 hours. Anemia Panel: No results for input(s): VITAMINB12, FOLATE, FERRITIN, TIBC, IRON, RETICCTPCT in the last 72 hours. Urine analysis:    Component Value Date/Time   COLORURINE AMBER (A) 08/16/2016 0856   APPEARANCEUR CLOUDY (A) 08/30/2016 0856   LABSPEC 1.013 08/19/2016 0856   PHURINE 5.0 08/27/2016 0856   GLUCOSEU 50 (A) 08/18/2016 0856   HGBUR SMALL (A) 08/24/2016 0856   BILIRUBINUR SMALL (A) 09/07/2016 0856   KETONESUR NEGATIVE 08/12/2016 0856   PROTEINUR 30 (A) 08/16/2016 0856   UROBILINOGEN 0.2 12/03/2013 2155   NITRITE NEGATIVE 08/14/2016 0856   LEUKOCYTESUR TRACE (A) 09/04/2016 0856   Sepsis  Labs: @LABRCNTIP (procalcitonin:4,lacticidven:4)  )No results found for this or any previous visit (from the past 240 hour(s)).       Radiology Studies: Dg Chest Port 1 View  Result Date: 08/12/2016 CLINICAL DATA:  Jaundice. Increasing weakness and decreased p.o. intake over the past week. EXAM: PORTABLE CHEST 1 VIEW COMPARISON:  PA and lateral chest 06/17/2005.  CT chest 05/27/2016. FINDINGS: Small focus of atelectasis is present in the right lower lobe. Lungs are otherwise clear. Heart size is normal. No pneumothorax or pleural effusion. Aortic atherosclerosis is noted. IMPRESSION: No acute disease. Small focus of subsegmental atelectasis right lower lobe noted. Atherosclerosis. Electronically Signed   By: Inge Rise M.D.   On: 09/07/2016 09:16        Scheduled Meds: Continuous Infusions:   LOS: 0 days    Time spent: 15 minutes. Greater than 50% of this time was spent in direct contact with the patient coordinating care.     Lelon Frohlich, MD [facility] Pager [phone]  If 7PM-7AM, please contact night-coverage [email or web address] Password [facility] 08/16/2016, 2:53 PM

## 2016-08-17 LAB — URINE CULTURE: Culture: NO GROWTH

## 2016-08-17 NOTE — Progress Notes (Signed)
PROGRESS NOTE    [name]  YQI:[id number] DOB: 1940-08-18 DOA: 08/28/2016 PCP: Lilian Coma, MD     Brief Narrative:  76 y/o woman with cholangiocarcinoma who per oncology is not a candidate for chemotherapy, now with advanced stage V CKD, brought in for dehydration and multiple electrolyte abnormalities.   Assessment & Plan:   Principal Problem:   Cholangiocarcinoma ([place]) Active Problems:   Goals of care, counseling/discussion   DNR (do not resuscitate) discussion   Palliative care encounter   CKD (chronic kidney disease) stage 5, GFR less than 15 ml/min (HCC)   Cholangiocarcinoma/CKD Stage V/Adult FTT -She has now become unresponsive. -She appears comfortable. -Family is understandably tearful and grieving. -We have transitioned to full comfort care today and I anticipate a hospital death. -Family support provided.   DVT prophylaxis: None Code Status: DNR Family Communication: Multiple family members at bedside Disposition Plan: anticipate hospital death  Consultants:   None  Procedures:   None  Antimicrobials:  Anti-infectives    None       Subjective: Unresponsive  Objective: Vitals:   08/17/16 0000 08/17/16 0030 08/17/16 0110 08/17/16 1400  BP:      Pulse:      Resp:      Temp: (!) 102.5 F (39.2 C) (!) 101.7 F (38.7 C) 100.1 F (37.8 C) 97.3 F (36.3 C)  TempSrc: Axillary Axillary Axillary Axillary  SpO2:      Weight:      Height:        Intake/Output Summary (Last 24 hours) at 08/17/16 1631 Last data filed at 08/17/16 1034  Gross per 24 hour  Intake                0 ml  Output                0 ml  Net                0 ml   Filed Weights   08/27/2016 0847  Weight: 65.8 kg (145 lb)    Examination:  Deferred as she is unresponsive and anticipate death.     Data Reviewed: I have personally reviewed following labs and imaging studies  CBC:  Recent Labs Lab 08/23/2016 0945  WBC 15.4*  NEUTROABS 13.1*  HGB  9.8*  HCT 28.3*  MCV 83.7  PLT 387   Basic Metabolic Panel:  Recent Labs Lab 08/11/16 1208 08/19/2016 0945  NA 134* 125*  K 3.6 3.3*  CL  --  86*  CO2 19* 14*  GLUCOSE 317* 259*  BUN 93.1* 128*  CREATININE 8.4* 9.36*  CALCIUM 9.0 7.6*   GFR: Estimated Creatinine Clearance: 5.1 mL/min (A) (by C-G formula based on SCr of 9.36 mg/dL (H)). Liver Function Tests:  Recent Labs Lab 08/11/16 1208 09/03/2016 0945  AST 145* 118*  ALT 113* 89*  ALKPHOS 1,117* 941*  BILITOT 13.00* 14.7*  PROT 7.4 7.3  ALBUMIN 2.0* 2.1*    Recent Labs Lab 09/01/2016 0945  LIPASE 15   No results for input(s): AMMONIA in the last 168 hours. Coagulation Profile: No results for input(s): INR, PROTIME in the last 168 hours. Cardiac Enzymes:  Recent Labs Lab 09/04/2016 0945  TROPONINI 0.15*   BNP (last 3 results) No results for input(s): PROBNP in the last 8760 hours. HbA1C:  Recent Labs  08/23/2016 0927  HGBA1C 7.1*   CBG:  Recent Labs Lab 08/29/2016 0921 08/22/2016 1701 08/10/2016 2158 08/16/16 0743 08/16/16 2344  GLUCAP 249*  249* 209* 181* 235*   Lipid Profile: No results for input(s): CHOL, HDL, LDLCALC, TRIG, CHOLHDL, LDLDIRECT in the last 72 hours. Thyroid Function Tests: No results for input(s): TSH, T4TOTAL, FREET4, T3FREE, THYROIDAB in the last 72 hours. Anemia Panel: No results for input(s): VITAMINB12, FOLATE, FERRITIN, TIBC, IRON, RETICCTPCT in the last 72 hours. Urine analysis:    Component Value Date/Time   COLORURINE AMBER (A) 08/27/2016 0856   APPEARANCEUR CLOUDY (A) 08/29/2016 0856   LABSPEC 1.013 08/22/2016 0856   PHURINE 5.0 08/13/2016 0856   GLUCOSEU 50 (A) 08/17/2016 0856   HGBUR SMALL (A) 08/10/2016 0856   BILIRUBINUR SMALL (A) 08/19/2016 0856   KETONESUR NEGATIVE 09/05/2016 0856   PROTEINUR 30 (A) 08/21/2016 0856   UROBILINOGEN 0.2 12/03/2013 2155   NITRITE NEGATIVE 08/30/2016 0856   LEUKOCYTESUR TRACE (A) 08/29/2016 0856   Sepsis  Labs: @LABRCNTIP (procalcitonin:4,lacticidven:4)  ) Recent Results (from the past 240 hour(s))  Urine culture     Status: None   Collection Time: 08/22/2016  8:56 AM  Result Value Ref Range Status   Specimen Description URINE, CATHETERIZED  Final   Special Requests NONE  Final   Culture   Final    NO GROWTH Performed at [facility], [address], [place],  [address]    Report Status 08/17/2016 FINAL  Final         Radiology Studies: No results found.      Scheduled Meds: . scopolamine  1 patch Transdermal Q72H   Continuous Infusions:   LOS: 1 day    Time spent: 15 minutes. Greater than 50% of this time was spent in direct contact with the patient coordinating care.     Lelon Frohlich, MD [facility] Pager [phone]  If 7PM-7AM, please contact night-coverage [email or web address] Password [email or web address] 08/17/2016, 4:31 PM

## 2016-08-17 NOTE — Progress Notes (Signed)
Pt family asked around 0000 to check pt's temp bc she felt warm. We checked it was 102.5. They asked me to give her something for the fever and asked for [place] to check her cbg (235). They asked that I page that information to the doctor. MD ordered suppository for fever and asked that family contact her if they have any concerns bc pt is "comfort care" so should not be covered for cbg checks. I talked to family and they declined on suppository and understood pt was here on comfort care and would not get any insulin. They decline on contacting MD as well. They asked that we recheck her temp at 1am which temp was better. See charting for 0100.

## 2016-08-18 DIAGNOSIS — N179 Acute kidney failure, unspecified: Secondary | ICD-10-CM

## 2016-08-18 DIAGNOSIS — N184 Chronic kidney disease, stage 4 (severe): Secondary | ICD-10-CM

## 2016-08-18 MED ORDER — HYDROMORPHONE HCL 1 MG/ML IJ SOLN
1.0000 mg | INTRAMUSCULAR | Status: DC | PRN
  Administered 2016-08-18 – 2016-08-20 (×10): 1 mg via INTRAVENOUS
  Filled 2016-08-18 (×10): qty 1

## 2016-08-18 NOTE — Progress Notes (Signed)
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   Assessment & Plan:   Principal Problem:   Cholangiocarcinoma ([place]) Active Problems:   Goals of care, counseling/discussion   DNR (do not resuscitate) discussion   Palliative care encounter   CKD (chronic kidney disease) stage 5, GFR less than 15 ml/min (HCC)   Acute renal failure superimposed on stage 4 chronic kidney disease ([place])   Cholangiocarcinoma/CKD Stage V/Adult FTT -She has now become unresponsive. -She appears comfortable. -Family is understandably tearful and grieving. -We have transitioned to full comfort care today and I anticipate a hospital death. -Family support provided.   DVT prophylaxis: None Code Status: DNR Family Communication: Multiple family members at bedside Disposition Plan: anticipate hospital death  Consultants:   None  Procedures:   None  Antimicrobials:  Anti-infectives    None       Subjective: Unresponsive  Objective: Vitals:   08/17/16 0000 08/17/16 0030 08/17/16 0110 08/17/16 1400  BP:      Pulse:      Resp:      Temp: (!) 102.5 F (39.2 C) (!) 101.7 F (38.7 C) 100.1 F (37.8 C) 97.3 F (36.3 C)  TempSrc: Axillary Axillary Axillary Axillary  SpO2:      Weight:      Height:        Intake/Output Summary (Last 24 hours) at 08/18/16 1750 Last data filed at 08/18/16 1200  Gross per 24 hour  Intake                0 ml  Output                0 ml  Net                0 ml   Filed Weights   09/05/2016 0847  Weight: 65.8 kg (145 lb)    Examination:  Deferred as she is unresponsive and anticipate death.     Data Reviewed: I have personally reviewed following labs and imaging  studies  CBC:  Recent Labs Lab 09/02/2016 0945  WBC 15.4*  NEUTROABS 13.1*  HGB 9.8*  HCT 28.3*  MCV 83.7  PLT 779   Basic Metabolic Panel:  Recent Labs Lab 08/14/2016 0945  NA 125*  K 3.3*  CL 86*  CO2 14*  GLUCOSE 259*  BUN 128*  CREATININE 9.36*  CALCIUM 7.6*   GFR: Estimated Creatinine Clearance: 5.1 mL/min (A) (by C-G formula based on SCr of 9.36 mg/dL (H)). Liver Function Tests:  Recent Labs Lab 08/19/2016 0945  AST 118*  ALT 89*  ALKPHOS 941*  BILITOT 14.7*  PROT 7.3  ALBUMIN 2.1*    Recent Labs Lab 08/10/2016 0945  LIPASE 15   No results for input(s): AMMONIA in the last 168 hours. Coagulation Profile: No results for input(s): INR, PROTIME in the last 168 hours. Cardiac Enzymes:  Recent Labs Lab 08/17/2016 0945  TROPONINI 0.15*   BNP (last 3 results) No results for input(s): PROBNP in the last 8760 hours. HbA1C: No results for input(s): HGBA1C in the last 72 hours. CBG:  Recent Labs Lab 08/11/2016 0921 08/23/2016 1701 08/28/2016 2158 08/16/16 0743 08/16/16 2344  GLUCAP 249* 249* 209* 181* 235*   Lipid Profile:  No results for input(s): CHOL, HDL, LDLCALC, TRIG, CHOLHDL, LDLDIRECT in the last 72 hours. Thyroid Function Tests: No results for input(s): TSH, T4TOTAL, FREET4, T3FREE, THYROIDAB in the last 72 hours. Anemia Panel: No results for input(s): VITAMINB12, FOLATE, FERRITIN, TIBC, IRON, RETICCTPCT in the last 72 hours. Urine analysis:    Component Value Date/Time   COLORURINE AMBER (A) 09/08/2016 0856   APPEARANCEUR CLOUDY (A) 08/29/2016 0856   LABSPEC 1.013 08/28/2016 0856   PHURINE 5.0 09/01/2016 0856   GLUCOSEU 50 (A) 08/28/2016 0856   HGBUR SMALL (A) 08/13/2016 0856   BILIRUBINUR SMALL (A) 08/31/2016 0856   KETONESUR NEGATIVE 08/16/2016 0856   PROTEINUR 30 (A) 08/25/2016 0856   UROBILINOGEN 0.2 12/03/2013 2155   NITRITE NEGATIVE 08/14/2016 0856   LEUKOCYTESUR TRACE (A) 08/31/2016 0856   Sepsis  Labs: @LABRCNTIP (procalcitonin:4,lacticidven:4)  ) Recent Results (from the past 240 hour(s))  Urine culture     Status: None   Collection Time: 09/04/2016  8:56 AM  Result Value Ref Range Status   Specimen Description URINE, CATHETERIZED  Final   Special Requests NONE  Final   Culture   Final    NO GROWTH Performed at [facility], [address] [place], [place] [address]    Report Status 08/17/2016 FINAL  Final         Radiology Studies: No results found.      Scheduled Meds: . scopolamine  1 patch Transdermal Q72H   Continuous Infusions:   LOS: 2 days    Time spent: 15 minutes. Greater than 50% of this time was spent in direct contact with the patient coordinating care.     Lelon Frohlich, MD [facility] Pager [phone]  If 7PM-7AM, please contact night-coverage [email or web address] Password [email or web address] 08/18/2016, 5:50 PM

## 2016-08-18 NOTE — Progress Notes (Signed)
Daily Progress Note   Patient Name: [name]       Date: 08/18/2016 DOB: 25-Jul-1940  Age: 76 y.o. MRN#: [id number] Attending Physician: Mikki Harbor* Primary Care Physician: Lilian Coma, MD Admit Date: 08/27/2016  Reason for Consultation/Follow-up: Establishing goals of care, Psychosocial/spiritual support and Terminal Care  Subjective: [name] is lying quietly in bed, surrounded by her family. She appears to be close to death. She does not open her eyes or try to communicate in any way. Family has no questions or concerns at this time. We talk about what is normal and expected as people come closer to end-of-life. That I believe [name] time is short. We talk about increasing pain medicine as needed. Support provided to nursing and family.  Length of Stay: 2  Current Medications: Scheduled Meds:  . scopolamine  1 patch Transdermal Q72H    Continuous Infusions:   PRN Meds: acetaminophen, HYDROmorphone (DILAUDID) injection, LORazepam, ondansetron **OR** ondansetron (ZOFRAN) IV  Physical Exam  Constitutional: No distress.  Acutely ill, temporal wasting, unresponsive  HENT:  Head: Normocephalic and atraumatic.  Cardiovascular: Normal rate and regular rhythm.   Pulmonary/Chest:  agonal breathing  Abdominal: Soft. She exhibits no distension.  Musculoskeletal: She exhibits no edema.  Muscle wasting  Neurological:  Nonresponsive  Skin: Skin is warm and dry.  Nursing note and vitals reviewed.           Vital Signs: BP (!) 92/44 (BP Location: Left Arm)   Pulse 98   Temp 97.3 F (36.3 C) (Axillary)   Resp 10   Ht 5\' 7"  (1.702 m)   Wt 65.8 kg (145 lb)   SpO2 100%   BMI 22.71 kg/m  SpO2: SpO2: 100 % O2 Device: O2 Device: Not Delivered O2  Flow Rate:    Intake/output summary: No intake or output data in the 24 hours ending 08/18/16 1219 LBM: Last BM Date: 08/14/16 Baseline Weight: Weight: 65.8 kg (145 lb) Most recent weight: Weight: 65.8 kg (145 lb)       Palliative Assessment/Data:    Flowsheet Rows     Most Recent Value  Intake Tab  Referral Department  Hospitalist  Unit at Time of Referral  ER  Palliative Care Primary Diagnosis  Cancer  Date Notified  08/31/2016  Palliative Care Type  New  Palliative care  Reason for referral  Clarify Goals of Care  Date of Admission  09/03/2016  Date first seen by Palliative Care  08/18/2016  # of days Palliative referral response time  0 Day(s)  # of days IP prior to Palliative referral  0  Clinical Assessment  Psychosocial & Spiritual Assessment  Palliative Care Outcomes  Patient/Family meeting held?  Yes  Who was at the meeting?  pt, husband Eduard Clos, daughter Randell Patient  Palliative Care Outcomes  Clarified goals of care, Provided psychosocial or spiritual support, Changed CPR status  Patient/Family wishes: Interventions discontinued/not started   Mechanical Ventilation      Patient Active Problem List   Diagnosis Date Noted  . Cholangiocarcinoma ([place]) 08/18/2016  . CKD (chronic kidney disease) stage 5, GFR less than 15 ml/min (HCC) 08/16/2016  . Goals of care, counseling/discussion   . DNR (do not resuscitate) discussion   . Palliative care encounter   . AKI (acute kidney injury) ([place]) 06/20/2016  . Sepsis ([place]) 06/18/2016  . 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 Obstructive jaundice due to cancer ([place]) 06/18/2016  . Liver mass 05/24/2016  . 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 Gallbladder mass, probable cancer 05/24/2016  . 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 Hyperbilirubinemia 05/22/2016  . Weight loss 05/22/2016  . Loss of appetite 05/22/2016  . Diabetes ([place]) 05/22/2016  . CKD (chronic kidney disease) stage 3, GFR 30-59  ml/min 05/22/2016  . Abnormal vaginal bleeding 05/22/2016  . UTI (urinary tract infection) 05/22/2016  . DYSPEPSIA 10/29/2006  . COUGH 09/28/2006  . URI 09/18/2006  . COLONIC POLYPS 07/23/2006  . Uncontrolled diabetes mellitus ([place]) 07/23/2006  . HYPERLIPIDEMIA 04/09/2006  . Essential hypertension 04/09/2006  . CONSTIPATION 04/09/2006  . OVERACTIVE BLADDER 04/09/2006  . OSTEOARTHRITIS 04/09/2006  . OSTEOPOROSIS 04/09/2006    Palliative Care Assessment & Plan   Patient Profile: 76 y.o. 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   Assessment: Cholangiocarcinoma: multiple oncology opinions, All state no treatment. 40 pound weight loss in 4 months, [name] has now become unresponsive, family has elected to focus on comfort and dignity, anticipated Hospital death.   Recommendations/Plan:  comfort measures only, anticipated Hospital death  Goals of Care and Additional Recommendations:  Limitations on Scope of Treatment: Full Comfort Care  Code Status:    Code Status Orders        Start     Ordered   08/10/2016 1619  Do not attempt resuscitation (DNR)  Continuous    Question Answer Comment  In the event of cardiac or respiratory ARREST Do not call a "code blue"   In the event of cardiac or respiratory ARREST Do not perform Intubation, CPR, defibrillation or ACLS   In the event of cardiac or respiratory ARREST Use medication by any route, position, wound care, and other measures to relive pain and suffering. May use oxygen, suction and manual treatment of airway obstruction as needed for comfort.      08/29/2016 1619    Code Status History    Date Active Date Inactive Code Status Order ID Comments User Context   08/29/2016  3:01 PM 08/29/2016  4:19 PM  DNR [id number]  Drue Novel, NP Inpatient   06/18/2016  5:10 AM 06/21/2016  3:19 PM Full Code [id number]  Etta Quill, DO ED   05/23/2016 12:26 AM 05/30/2016  4:16 PM Full Code [id number]  Lily Kocher, MD ED       Prognosis:   Hours - Days  Discharge Planning:  Anticipated Hospital Death  Care plan was discussed with nursing staff, case manager, social worker, and Dr. Candyce Churn.  Thank you for allowing the Palliative Medicine Team to assist in the care of this patient.   Time In: 1120 Time Out: 1140 Total Time 20 minutes Prolonged Time Billed  no       Greater than 50%  of this time was spent counseling and coordinating care related to the above assessment and plan.  Drue Novel, NP  Please contact Palliative Medicine Team phone at [phone] for questions and concerns.

## 2016-08-18 NOTE — Progress Notes (Signed)
Patient's family requested patient's IV dilaudid for pain.

## 2016-08-18 NOTE — Progress Notes (Signed)
Straightened patient up in bed per family request. Patient continues to have decreased respirations and is unconscious.

## 2016-08-19 DIAGNOSIS — N185 Chronic kidney disease, stage 5: Secondary | ICD-10-CM

## 2016-08-19 MED ORDER — ARTIFICIAL TEARS OP OINT
TOPICAL_OINTMENT | OPHTHALMIC | Status: DC | PRN
  Filled 2016-08-19: qty 3.5

## 2016-08-19 NOTE — Progress Notes (Signed)
Daily Progress Note   [name]       Date: 08/19/2016 DOB: 1941/04/02  Age: 76 y.o. MRN#: [id number] Attending Physician: Mikki Harbor* Primary Care Physician: Lilian Coma, MD Admit Date: 09/03/2016  Reason for Consultation/Follow-up: Establishing goals of care and Psychosocial/spiritual support  Subjective: [name] is lying quietly in bed. She does not try to communicate in any way, she appears to be actively dying. She does not respond to touch, her eyes are staying open. She is surrounded by family at this time.  [name] continues to linger.  Daughters [name] and [name] asked that we meet in the hallway.  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 They see that her perseverance as a sign that she wants to continue to fight. They are requesting IV fluids for nutrition. They are not asking for a feeding by mouth or PEG tube. We talk about IV fluids dilating endorphins, causing significant discomfort at end-of-life. [name] states multiple times, "we are in a hospital in the [place]" and she feels that her mother is not being treated. We talk about treatment for pain management, comfort and dignity. [name] states that she can accept her mother dying of cancer or kidney failure, but not by "starvation". We talk about the natural progression of death and dying, not eating;  I correlate this to how we stop feeding children when they fall asleep in the high chair. I ask what [name] husband wants during this time. [name] states that he does not want to come see [name] in this condition. I again share with daughters that IV fluids at this time would likely increase her pain, she would moan more and move in the bed more.  I  share that we see how difficult this is for both them and [name], "we wish things were different". Conference with hospitalist, Dr. Jerilee Hoh.   Length of Stay: 3  Current Medications: Scheduled Meds:  . scopolamine  1 patch Transdermal Q72H    Continuous Infusions:   PRN Meds: acetaminophen, HYDROmorphone (DILAUDID) injection, LORazepam, ondansetron **OR** ondansetron (ZOFRAN) IV  Physical Exam  Constitutional: No distress.  Frail, appears to be actively dying  HENT:  Head: Normocephalic and atraumatic.  Cardiovascular: Normal rate and regular rhythm.   Pulmonary/Chest:  Shallow respirations, agonal,  Abdominal: Soft. She exhibits no distension.  Musculoskeletal: She exhibits no edema.  Neurological:  Nonresponsive, does not make eye contact or attempt to communicate in any way  Skin: Skin is warm and dry.  Nursing note and vitals reviewed.           Vital Signs: BP (!) 117/48 (BP Location: Right Arm)   Pulse 79   Temp 98.3 F (36.8 C) (Oral)   Resp 15   Ht 5\' 7"  (1.702 m)   Wt 65.8 kg (145 lb)   SpO2 99%   BMI 22.71 kg/m  SpO2: SpO2: 99 % O2 Device: O2 Device: Not Delivered O2 Flow Rate:    Intake/output summary:  Intake/Output Summary (Last 24 hours) at 08/19/16 1631 Last data filed at 08/19/16 1524  Gross per 24 hour  Intake                0 ml  Output                0 ml  Net                0 ml   LBM: Last BM Date: 08/14/16 Baseline Weight: Weight: 65.8 kg (145 lb) Most recent weight: Weight: 65.8 kg (145 lb)       Palliative Assessment/Data:    Flowsheet Rows     Most Recent Value  Intake Tab  Referral Department  Hospitalist  Unit at Time of Referral  ER  Palliative Care Primary Diagnosis  Cancer  Date Notified  08/10/2016  Palliative Care Type  New Palliative care  Reason for referral  Clarify Goals of Care  Date of Admission  09/02/2016  Date first seen by Palliative Care  08/11/2016  # of days Palliative referral response time  0  Day(s)  # of days IP prior to Palliative referral  0  Clinical Assessment  Psychosocial & Spiritual Assessment  Palliative Care Outcomes  Fisher/Family meeting held?  Yes  Who was at the meeting?  pt, husband [name], daughter [name]  Palliative Care Outcomes  Clarified goals of care, Provided psychosocial or spiritual support, Changed CPR status  Fisher/Family wishes: Interventions discontinued/not started   Mechanical Ventilation      Fisher Active Problem List   Diagnosis Date Noted  . Acute renal failure superimposed on stage 4 chronic kidney disease ([place])   . Cholangiocarcinoma ([place]) 08/14/2016  . CKD (chronic kidney disease) stage 5, GFR less than 15 ml/min (HCC) 09/05/2016  . Goals of care, counseling/discussion   . DNR (do not resuscitate) discussion   . Palliative care encounter   . AKI (acute kidney injury) ([place]) 06/20/2016  . Sepsis ([place]) 06/18/2016  . 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 Obstructive jaundice due to cancer ([place]) 06/18/2016  . Liver mass 05/24/2016  . 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 Gallbladder mass, probable cancer 05/24/2016  . 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 Hyperbilirubinemia 05/22/2016  . Weight loss 05/22/2016  . Loss of appetite 05/22/2016  . Diabetes ([place]) 05/22/2016  . CKD (chronic kidney disease) stage 3, GFR 30-59 ml/min 05/22/2016  . Abnormal vaginal bleeding 05/22/2016  . UTI (urinary tract infection) 05/22/2016  . DYSPEPSIA 10/29/2006  . COUGH 09/28/2006  . URI 09/18/2006  . COLONIC POLYPS 07/23/2006  . Uncontrolled diabetes mellitus ([place]) 07/23/2006  . HYPERLIPIDEMIA 04/09/2006  . Essential hypertension 04/09/2006  . CONSTIPATION 04/09/2006  . OVERACTIVE BLADDER 04/09/2006  . OSTEOARTHRITIS 04/09/2006  . OSTEOPOROSIS 04/09/2006  Palliative Care Assessment & Plan   Fisher Profile: 75 y.o.femalewith past medical history of new diagnosis of Stage IV gallbladder  cancer diagnosed January 2018, with metastatic spread to liver, weight loss and 40 pounds in 4 months diabetes, hypertension, rheumatoid osteoporosis, admitted on 04/18/2018with dehydration, uremia.   Assessment: Cholangiocarcinoma: multiple oncology opinions, All state no treatment. 40 pound weight loss in 4 months, [name] has now become unresponsive, family has elected to focus on comfort and dignity, anticipated Hospital death.  2022/09/06, Mrs. 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 They see that her perseverance as a sign that she wants to continue to fight. They are requesting IV fluids for nutrition. They are not asking for a feeding by mouth or PEG tube. We talk about IV fluids dilating endorphins, causing significant discomfort at end-of-life.  Recommendations/Plan:  continue with comfort measures at this time, no IV fluids. Family is considering taking Mrs. Driscoll home with the benefits of hospice.  Goals of Care and Additional Recommendations:  Limitations on Scope of Treatment: Full Comfort Care  Code Status:    Code Status Orders        Start     Ordered   08/17/2016 1619  Do not attempt resuscitation (DNR)  Continuous    Question Answer Comment  In the event of cardiac or respiratory ARREST Do not call a "code blue"   In the event of cardiac or respiratory ARREST Do not perform Intubation, CPR, defibrillation or ACLS   In the event of cardiac or respiratory ARREST Use medication by any route, position, wound care, and other measures to relive pain and suffering. May use oxygen, suction and manual treatment of airway obstruction as needed for comfort.      08/19/2016 1619    Code Status History    Date Active Date Inactive Code Status Order ID Comments User Context   08/31/2016  3:01 PM 08/15/2016  4:19 PM DNR [id number]  Drue Novel, NP Inpatient   06/18/2016  5:10 AM 06/21/2016  3:19 PM Full Code [id number]  Etta Quill, DO ED    05/23/2016 12:26 AM 05/30/2016  4:16 PM Full Code [id number]  Lily Kocher, MD ED       Prognosis:   Hours - Days  Discharge Planning:  Anticipated Hospital Death  Care plan was discussed with nursing staff, case manager, social worker, and Dr. Jerilee Hoh.  Thank you for allowing the Palliative Medicine Team to assist in the care of this Fisher.   Time In: 0945 Time Out: 1025 Total Time 40 minutes Prolonged Time Billed  no       Greater than 50%  of this time was spent counseling and coordinating care related to the above assessment and plan.  Drue Novel, NP  Please contact Palliative Medicine Team phone at [phone] for questions and concerns.

## 2016-08-19 NOTE — Care Management (Addendum)
Family discussing hospice services. May elect to take patient home with hospice services. Plan still unclear at this time. CM following for needs. Spiritual consult ordered.

## 2016-08-19 NOTE — Progress Notes (Addendum)
Pts family refusing to have pt turned thinking that will make pt pass quicker. Would not let RN, NA check pt to see if urinated d/t this. Educated on skin breakdown and infection. Pt resting quietly after Dilaudid given per family request. Pts family does not want vital signs to be taken at this time, wants to wait until day shift comes on. Will continue to monitor pt.

## 2016-08-19 NOTE — Consult Note (Signed)
   [facility] CM Inpatient Consult   08/19/2016  [name] January 15, 1941 [id number]   Patient screened for potential [facility] Management services. Patient is eligible for [place]. Electronic medical record reveals patient's condition will most likely require Hospice services. [facility] Care Management services not appropriate at this time. If patient's post hospital needs change please place a [facility] Care Management consult. For questions please contact:   Saroya Riccobono RN, [facility] Liaison  [phone]) Business Mobile [phone]) Toll free office

## 2016-08-19 NOTE — Progress Notes (Signed)
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 She is very close to death.   Assessment & Plan:   Principal Problem:   Cholangiocarcinoma ([place]) Active Problems:   Goals of care, counseling/discussion   DNR (do not resuscitate) discussion   Palliative care encounter   CKD (chronic kidney disease) stage 5, GFR less than 15 ml/min (HCC)   Acute renal failure superimposed on stage 4 chronic kidney disease ([place])   Cholangiocarcinoma/CKD Stage V/Adult FTT -She has now become unresponsive. -She appears comfortable. -Family is understandably tearful and grieving. -We have transitioned to full comfort care today and I anticipate a hospital death. -Family support provided. They are now considering the possibility of taking her home to die as this is what the patient had said she wanted.   DVT prophylaxis: None Code Status: DNR Family Communication: Multiple family members at bedside Disposition Plan: anticipate hospital death  Consultants:   Palliative Care  Procedures:   None  Antimicrobials:  Anti-infectives    None       Subjective: Unresponsive  Objective: Vitals:   08/17/16 0030 08/17/16 0110 08/17/16 1400 08/18/16 2216  BP:    (!) 117/48  Pulse:    79  Resp:    15  Temp: (!) 101.7 F (38.7 C) 100.1 F (37.8 C) 97.3 F (36.3 C) 98.3 F (36.8 C)  TempSrc: Axillary Axillary Axillary Oral  SpO2:    99%  Weight:      Height:        Intake/Output Summary (Last 24 hours) at 08/19/16 1559 Last data filed at 08/19/16 1524  Gross per 24 hour  Intake                0 ml  Output                0 ml  Net                0 ml   Filed Weights   08/28/2016 0847  Weight: 65.8 kg (145 lb)     Examination:  Deferred as she is unresponsive and anticipate death.     Data Reviewed: I have personally reviewed following labs and imaging studies  CBC:  Recent Labs Lab 08/21/2016 0945  WBC 15.4*  NEUTROABS 13.1*  HGB 9.8*  HCT 28.3*  MCV 83.7  PLT 354   Basic Metabolic Panel:  Recent Labs Lab 08/11/2016 0945  NA 125*  K 3.3*  CL 86*  CO2 14*  GLUCOSE 259*  BUN 128*  CREATININE 9.36*  CALCIUM 7.6*   GFR: Estimated Creatinine Clearance: 5.1 mL/min (A) (by C-G formula based on SCr of 9.36 mg/dL (H)). Liver Function Tests:  Recent Labs Lab 08/13/2016 0945  AST 118*  ALT 89*  ALKPHOS 941*  BILITOT 14.7*  PROT 7.3  ALBUMIN 2.1*    Recent Labs Lab 09/02/2016 0945  LIPASE 15   No results for input(s): AMMONIA in the last 168 hours. Coagulation Profile: No results for input(s): INR, PROTIME in the last 168 hours. Cardiac Enzymes:  Recent Labs Lab 08/14/2016 0945  TROPONINI 0.15*   BNP (last 3 results) No results for input(s): PROBNP in the last 8760 hours. HbA1C: No results for input(s): HGBA1C in the  last 72 hours. CBG:  Recent Labs Lab 08/17/2016 0921 08/28/2016 1701 09/08/2016 2158 08/16/16 0743 08/16/16 2344  GLUCAP 249* 249* 209* 181* 235*   Lipid Profile: No results for input(s): CHOL, HDL, LDLCALC, TRIG, CHOLHDL, LDLDIRECT in the last 72 hours. Thyroid Function Tests: No results for input(s): TSH, T4TOTAL, FREET4, T3FREE, THYROIDAB in the last 72 hours. Anemia Panel: No results for input(s): VITAMINB12, FOLATE, FERRITIN, TIBC, IRON, RETICCTPCT in the last 72 hours. Urine analysis:    Component Value Date/Time   COLORURINE AMBER (A) 08/26/2016 0856   APPEARANCEUR CLOUDY (A) 08/23/2016 0856   LABSPEC 1.013 08/10/2016 0856   PHURINE 5.0 09/06/2016 0856   GLUCOSEU 50 (A) 08/22/2016 0856   HGBUR SMALL (A) 08/25/2016 0856   BILIRUBINUR SMALL (A) 08/17/2016 0856   KETONESUR NEGATIVE 09/06/2016 0856   PROTEINUR 30 (A) 09/07/2016 0856    UROBILINOGEN 0.2 12/03/2013 2155   NITRITE NEGATIVE 08/18/2016 0856   LEUKOCYTESUR TRACE (A) 08/27/2016 0856   Sepsis Labs: @LABRCNTIP (procalcitonin:4,lacticidven:4)  ) Recent Results (from the past 240 hour(s))  Urine culture     Status: None   Collection Time: 08/12/2016  8:56 AM  Result Value Ref Range Status   Specimen Description URINE, CATHETERIZED  Final   Special Requests NONE  Final   Culture   Final    NO GROWTH Performed at [facility], [place] [address] [place], [place] [address]    Report Status 08/17/2016 FINAL  Final         Radiology Studies: No results found.      Scheduled Meds: . scopolamine  1 patch Transdermal Q72H   Continuous Infusions:   LOS: 3 days    Time spent: 15 minutes. Greater than 50% of this time was spent in direct contact with the patient coordinating care.     Lelon Frohlich, MD [facility] Pager [phone]  If 7PM-7AM, please contact night-coverage [email or web address] Password [email or web address] 08/19/2016, 3:59 PM

## 2016-09-09 NOTE — Progress Notes (Signed)
I was called to room by family member stating "I think she just passed away".  Upon arriving in the room patient was not breathing and had no pulse.  No heart sounds were auscultated.  The above was verified by a second nurse C. Quillian Quince, RN for time of death at [phone].  Dr. Darrick Meigs notified via phone.  [place] donor called and gave reference # [id number] and patient is not a donor per Guerry Bruin.

## 2016-09-09 NOTE — Discharge Summary (Signed)
Physician Discharge Summary  [name] JSE:[id number] DOB: Jul 04, 1940 DOA: 09/02/2016  PCP: Lilian Coma, MD  Admit date: 08/21/2016 Discharge date: 2016/09/16  Disposition: deceased.   Recommendations for Outpatient Follow-up: None.   CODE STATUS: Comfort care.  Diet recommendation: None.   Brief/Interim Summary: Patient was admitted for IVF by Dr Jerilee Hoh on August 16, 2015.  As per her H and P:  " [name] is a 76 y.o. female with diagnosis of cholangiocarcinoma in January 2018, who is not a candidate for chemotherapy per oncologist in [place] and [place]. Onc recommended hospice care but daughter refused as hospice was not willing to give IVF. She is currently not in pain. When I ask why she came to the hospital one of the daughters says: "I cannot watch her stay at home and die of dehydration and starvation so I brought her in to get IVF". W/u in the ED is c/w multiple electrolyte abnormalities: Na 125, K 3.3, Co2 14, BUN 128, Cr 9.36, transaminitis, trop 0.15, WBC 15.4, CXR without changes. Admission requested.  HOSPITAL COURSE:  Patient was admitted and she was given IVF.  She is a 76 yo woman with biopsy proven cholangiocarcinoma who per oncology was not a candidate for chemotherapy nor surgery, now with advanced stage V.  She was seen by Palliative care and subsequently her GOC was changed to comfort care.  She subsequently became unresponsive, and she died this morning at 06:38.  Family and extended family was surrounding her.     Discharge Diagnoses:  Principal Problem:   Cholangiocarcinoma ([place]) Active Problems:   Goals of care, counseling/discussion   DNR (do not resuscitate) discussion   Palliative care encounter   CKD (chronic kidney disease) stage 5, GFR less than 15 ml/min (HCC)   Acute renal failure superimposed on stage 4 chronic kidney disease ([name])    Discharge Instructions:  None.    Allergies  Allergen Reactions  . Rosiglitazone Maleate Other  (See Comments)    couldn't sleep  . Metformin Diarrhea  . Penicillins Hives and Rash    Has patient had a PCN reaction causing immediate rash, facial/tongue/throat swelling, SOB or lightheadedness with hypotension: yes Has patient had a PCN reaction causing severe rash involving mucus membranes or skin necrosis: no Has patient had a PCN reaction that required hospitalization:no Has patient had a PCN reaction occurring within the last 10 years: no If all of the above answers are "NO", then may proceed with Cephalosporin use.  . Pioglitazone Other (See Comments)    Joint pain     Consultations:  Palliative care medicine.    Procedures/Studies: US Renal  Result Date: 08/08/2016 CLINICAL DATA:  Acute renal failure EXAM: RENAL / URINARY TRACT ULTRASOUND COMPLETE COMPARISON:  None. FINDINGS: Right Kidney: Length: 10 cm. Echogenicity within normal limits. No mass or hydronephrosis visualized. Left Kidney: Length: 10 cm. Echogenicity within normal limits. No mass or hydronephrosis visualized. Bladder: Appears normal for degree of bladder distention. Other: Ascites noted. IMPRESSION: 1. No hydronephrosis identified Electronically Signed   By: Kerby Moors M.D.   On: 08/08/2016 14:34   Dg Chest Port 1 View  Result Date: 08/27/2016 CLINICAL DATA:  Jaundice. Increasing weakness and decreased p.o. intake over the past week. EXAM: PORTABLE CHEST 1 VIEW COMPARISON:  PA and lateral chest 06/17/2005.  CT chest 05/27/2016. FINDINGS: Small focus of atelectasis is present in the right lower lobe. Lungs are otherwise clear. Heart size is normal. No pneumothorax or pleural effusion. Aortic atherosclerosis  is noted. IMPRESSION: No acute disease. Small focus of subsegmental atelectasis right lower lobe noted. Atherosclerosis. Electronically Signed   By: Inge Rise M.D.   On: 09/01/2016 09:16     Subjective:  None.    Discharge Exam: Vitals:   08/17/16 1400 08/18/16 2216  BP:  (!) 117/48  Pulse:   79  Resp:  15  Temp: 97.3 F (36.3 C) 98.3 F (36.8 C)   Vitals:   08/17/16 0030 08/17/16 0110 08/17/16 1400 08/18/16 2216  BP:    (!) 117/48  Pulse:    79  Resp:    15  Temp: (!) 101.7 F (38.7 C) 100.1 F (37.8 C) 97.3 F (36.3 C) 98.3 F (36.8 C)  TempSrc: Axillary Axillary Axillary Oral  SpO2:    99%  Weight:      Height:        The results of significant diagnostics from this hospitalization (including imaging, microbiology, ancillary and laboratory) are listed below for reference.     Microbiology: Recent Results (from the past 240 hour(s))  Urine culture     Status: None   Collection Time: 09/01/2016  8:56 AM  Result Value Ref Range Status   Specimen Description URINE, CATHETERIZED  Final   Special Requests NONE  Final   Culture   Final    NO GROWTH Performed at [facility], [address], [place], [place] [address]    Report Status 08/17/2016 FINAL  Final    Basic Metabolic Panel:  Recent Labs Lab 09/07/2016 0945  NA 125*  K 3.3*  CL 86*  CO2 14*  GLUCOSE 259*  BUN 128*  CREATININE 9.36*  CALCIUM 7.6*   Liver Function Tests:  Recent Labs Lab 09/06/2016 0945  AST 118*  ALT 89*  ALKPHOS 941*  BILITOT 14.7*  PROT 7.3  ALBUMIN 2.1*    Recent Labs Lab 08/22/2016 0945  LIPASE 15   CBC:  Recent Labs Lab 08/21/2016 0945  WBC 15.4*  NEUTROABS 13.1*  HGB 9.8*  HCT 28.3*  MCV 83.7  PLT 391   Cardiac Enzymes:  Recent Labs Lab 09/05/2016 0945  TROPONINI 0.15*   BNP: Invalid input(s): POCBNP CBG:  Recent Labs Lab 08/28/2016 0921 09/02/2016 1701 09/07/2016 2158 08/16/16 0743 08/16/16 2344  GLUCAP 249* 249* 209* 181* 235*   Urinalysis    Component Value Date/Time   COLORURINE AMBER (A) 08/23/2016 0856   APPEARANCEUR CLOUDY (A) 08/31/2016 0856   LABSPEC 1.013 08/17/2016 0856   PHURINE 5.0 08/30/2016 0856   GLUCOSEU 50 (A) 08/26/2016 0856   HGBUR SMALL (A) 08/19/2016 0856   BILIRUBINUR SMALL (A) 08/26/2016 0856   KETONESUR  NEGATIVE 09/07/2016 0856   PROTEINUR 30 (A) 08/19/2016 0856   UROBILINOGEN 0.2 12/03/2013 2155   NITRITE NEGATIVE 08/28/2016 0856   LEUKOCYTESUR TRACE (A) 08/26/2016 0856   Microbiology Recent Results (from the past 240 hour(s))  Urine culture     Status: None   Collection Time: 08/19/2016  8:56 AM  Result Value Ref Range Status   Specimen Description URINE, CATHETERIZED  Final   Special Requests NONE  Final   Culture   Final    NO GROWTH Performed at [facility], [place] [address], [place], [place] [address]    Report Status 08/17/2016 FINAL  Final    Time coordinating discharge: Over 30 minutes SIGNED:  Orvan Falconer, MD FACP [facility] 09/08/16, 9:20 AM   If 7PM-7AM, please contact night-coverage [email or web address] Password [email or web address]

## 2016-09-09 DEATH — deceased

## 2017-01-05 IMAGING — CR DG ABDOMEN ACUTE W/ 1V CHEST
3 series · 3 of 3 positions shown · non-contrast
Comparison: CT, 12/09/2012.

CLINICAL DATA: Constipation x1 week. No chest complaints. Hx of
HTN, diabetes. No chest or abdomen sx.

EXAM:
DG ABDOMEN ACUTE W/ 1V CHEST

[w chest pa]
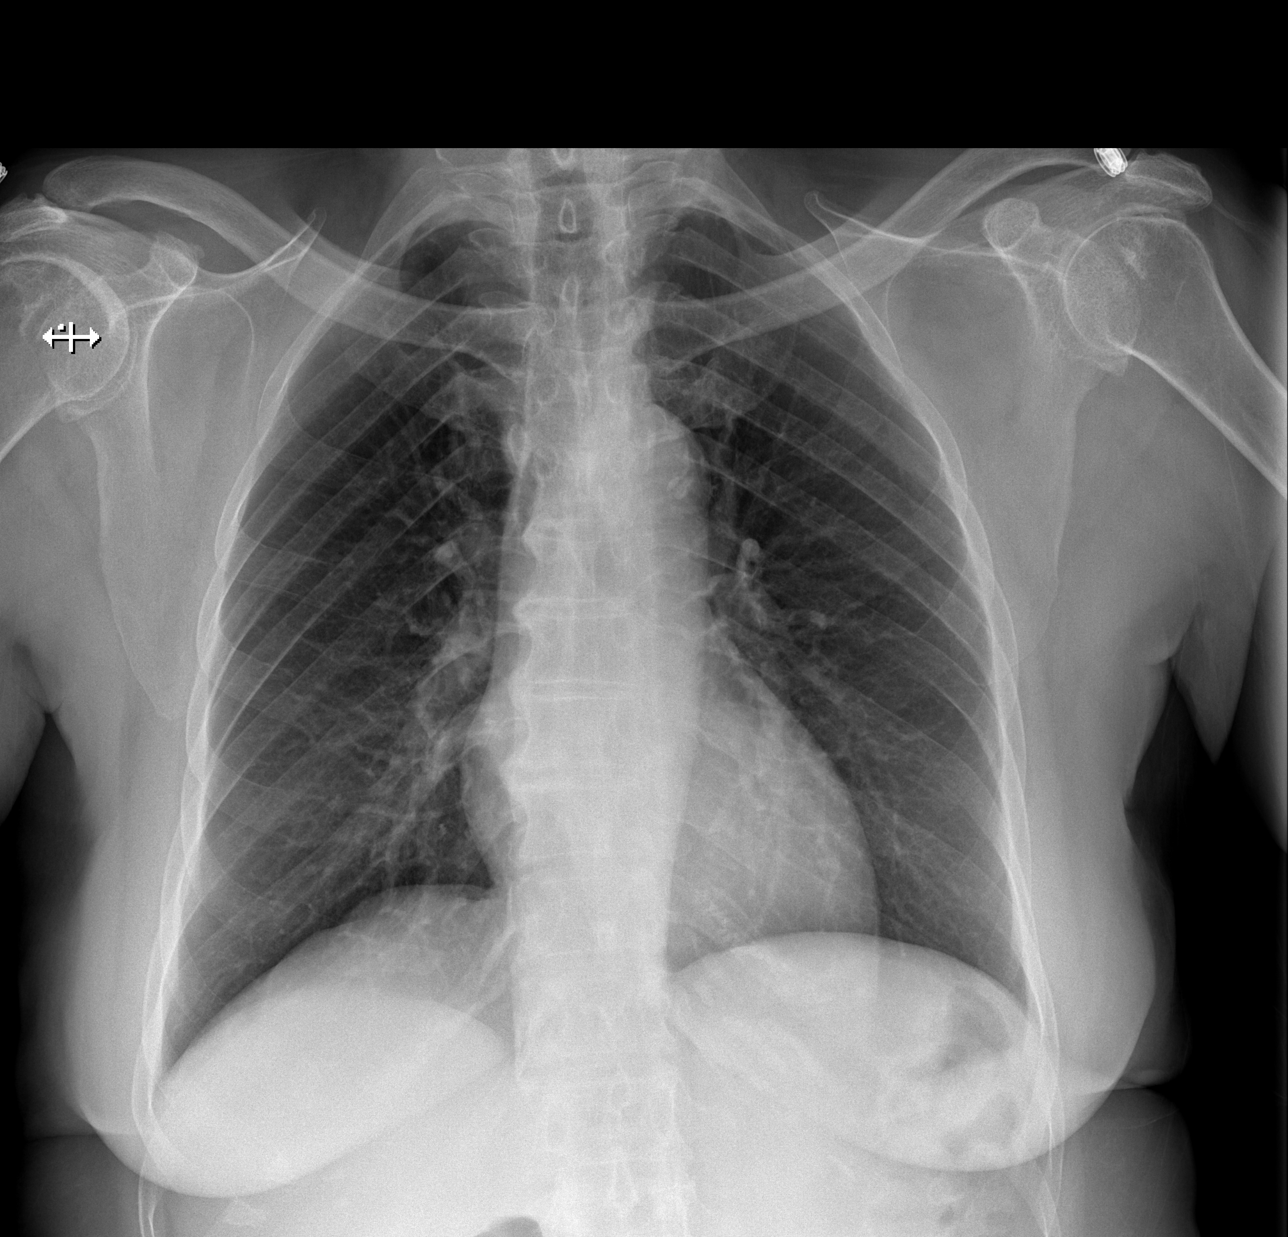

[w abdomen upright]
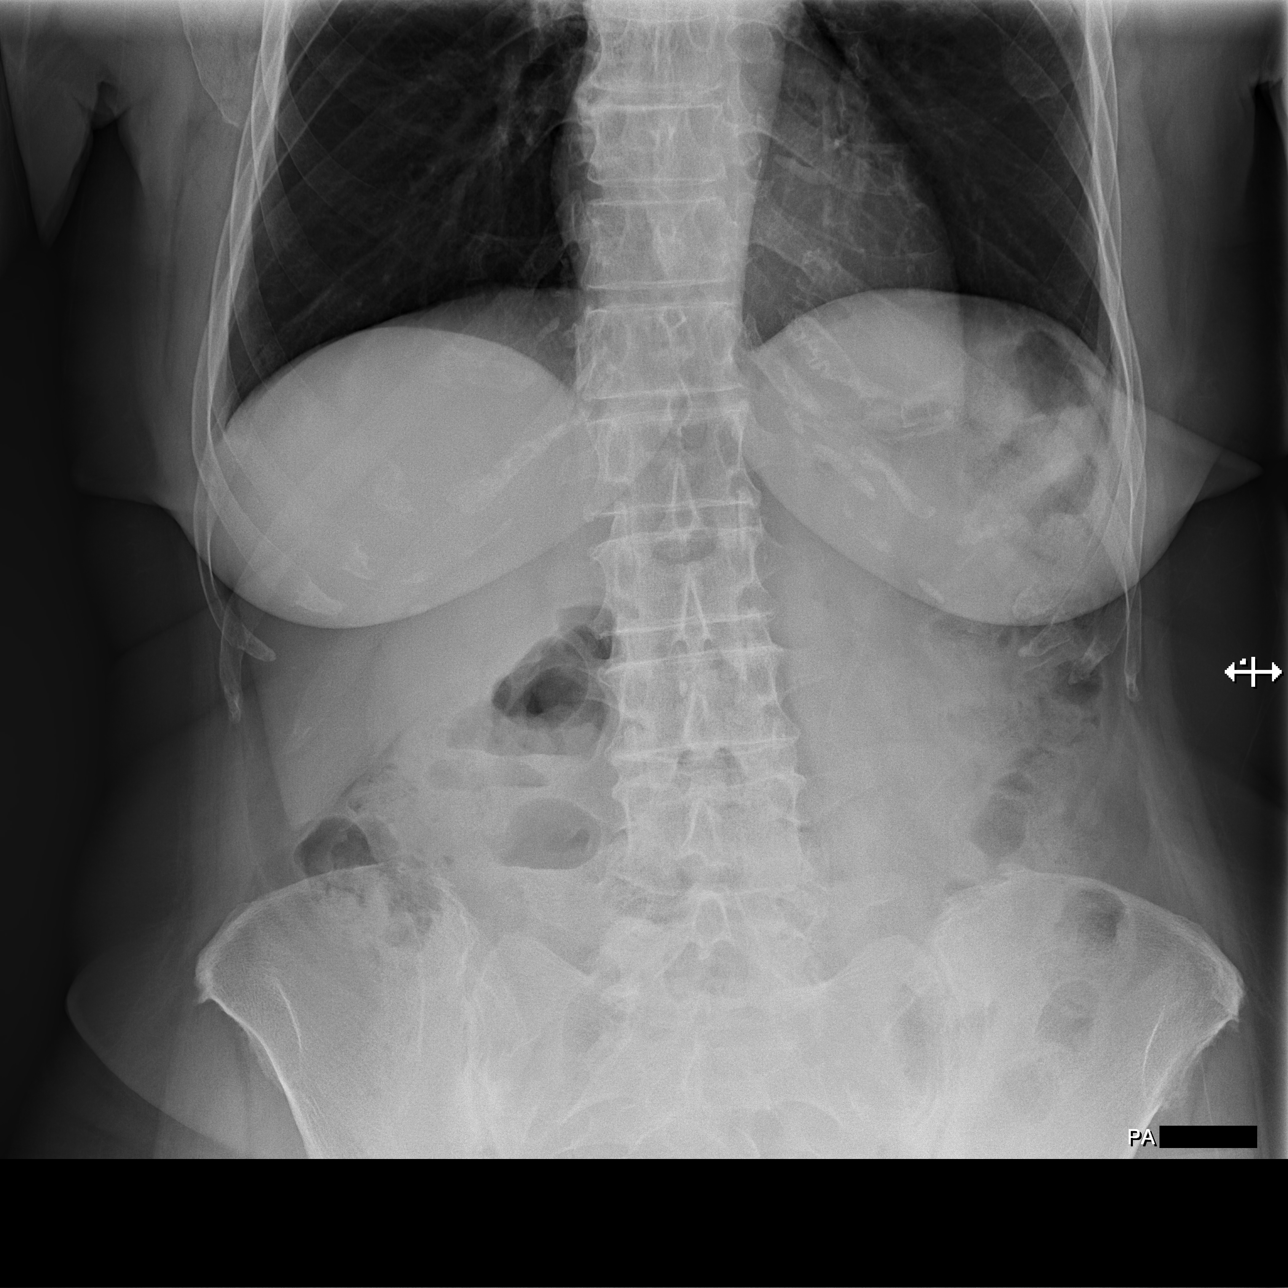

[t abdomen supine]
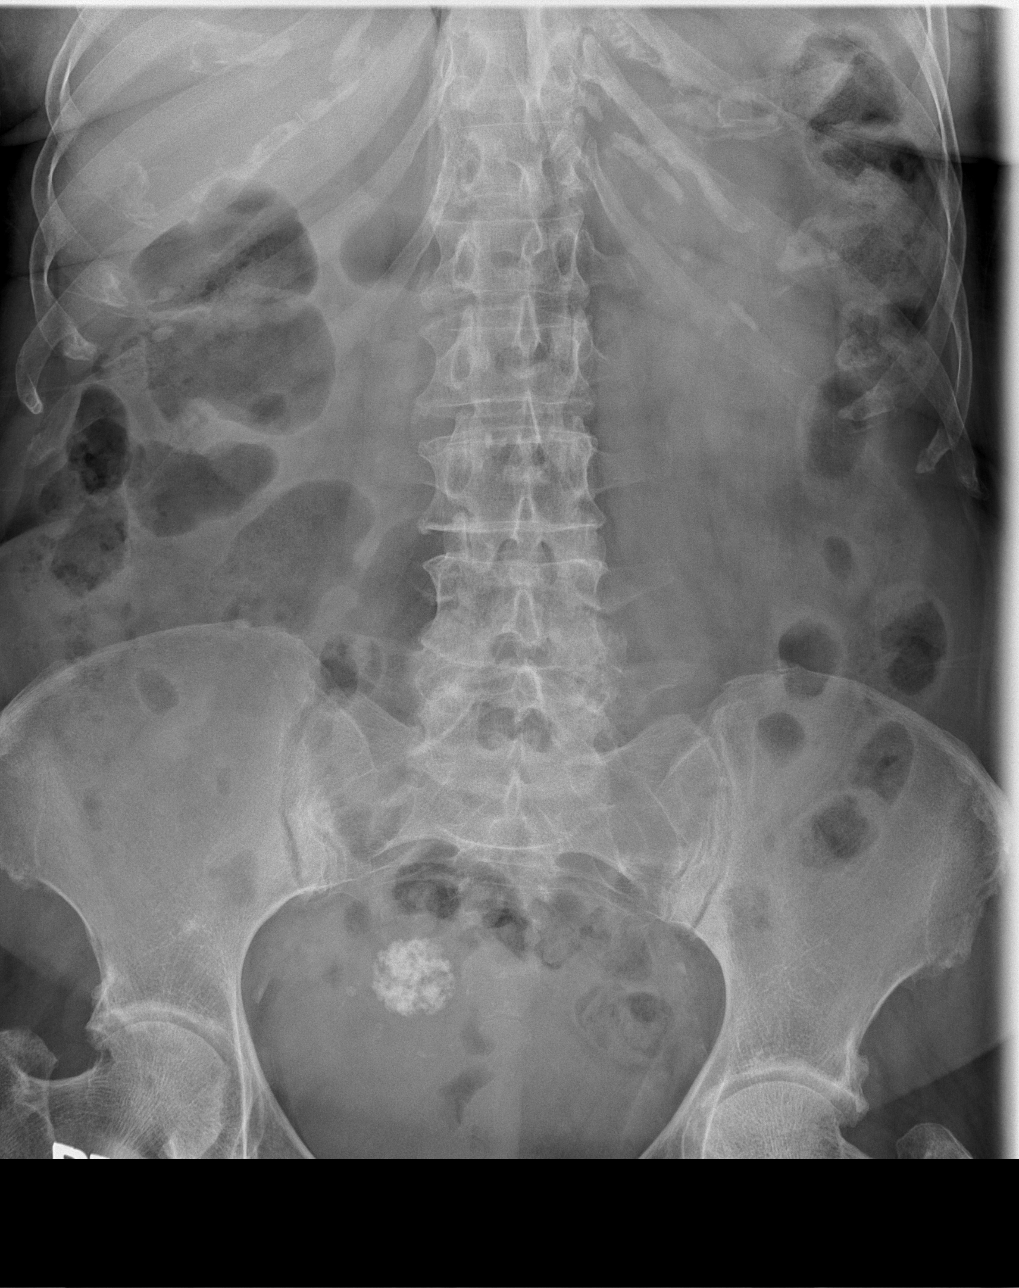

[3 of 3 positions shown; findings below may reference images not displayed]

FINDINGS: Normal bowel gas pattern. No significant increase in colonic stool.
No free air.

Calcified fibroid in the right central pelvis. No evidence of renal
or ureteral stones. Soft tissues are otherwise unremarkable.

Heart, mediastinum and hila are unremarkable. Clear lungs. No
pleural effusion or pneumothorax.
IMPRESSION: 1. No acute findings within the abdomen or pelvis.
2. No acute cardiopulmonary disease.

## 2017-10-17 IMAGING — RF DG ERCP WO/W SPHINCTEROTOMY
1 series · 9 of 9 positions shown · non-contrast
Comparison: None.

CLINICAL DATA: Obstructive jaundice

EXAM:
ERCP
TECHNIQUE: Multiple spot images obtained with the fluoroscopic device and
submitted for interpretation post-procedure.
FLUOROSCOPY TIME:  Fluoroscopy Time:  6 minutes and 25 seconds
Radiation Exposure Index (if provided by the fluoroscopic device):
93.84 mGy
Number of Acquired Spot Images: 9

[Series 1: run · 9 of 9 slices shown]
[im 1/9]
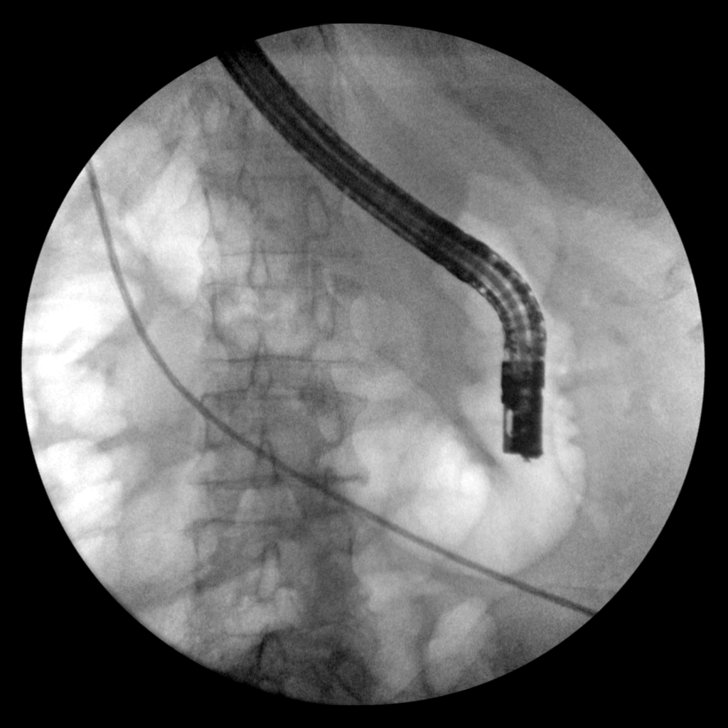
[im 2/9]
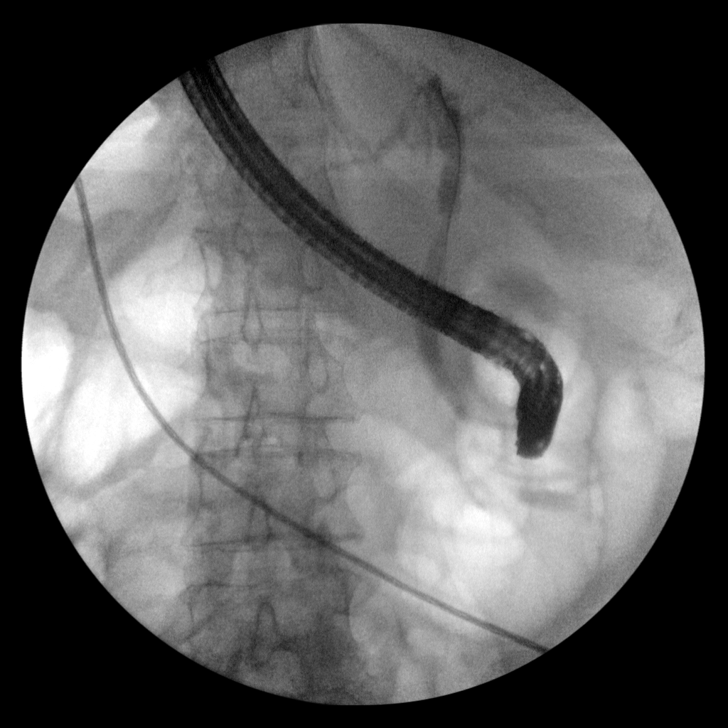
[im 3/9]
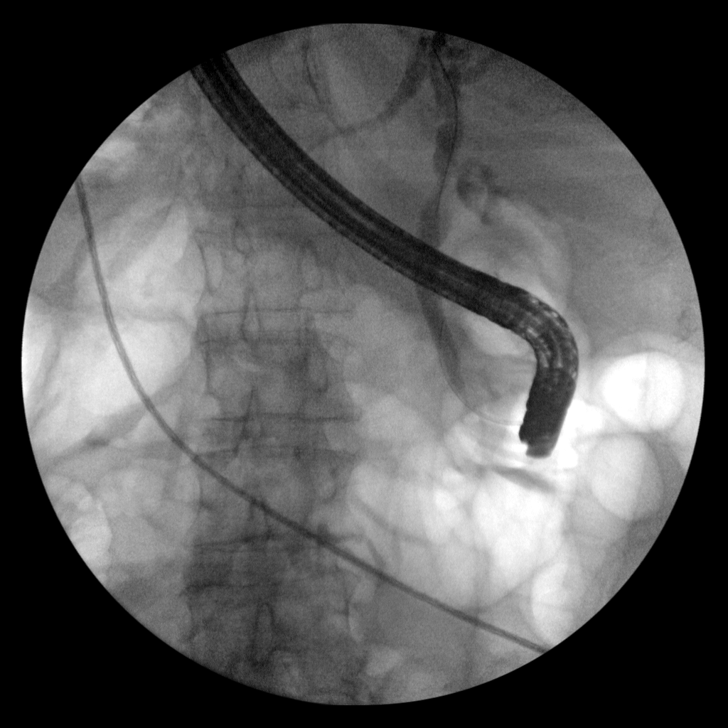
[im 4/9]
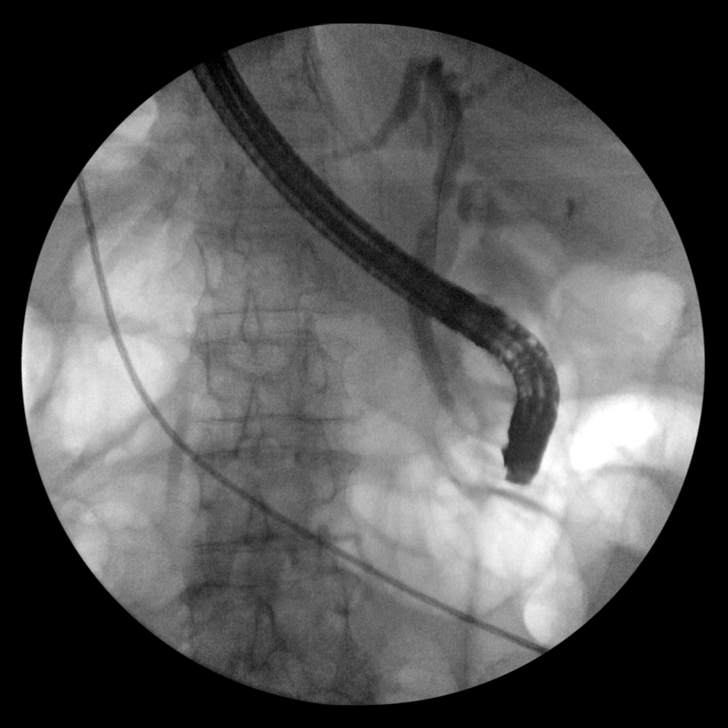
[im 5/9]
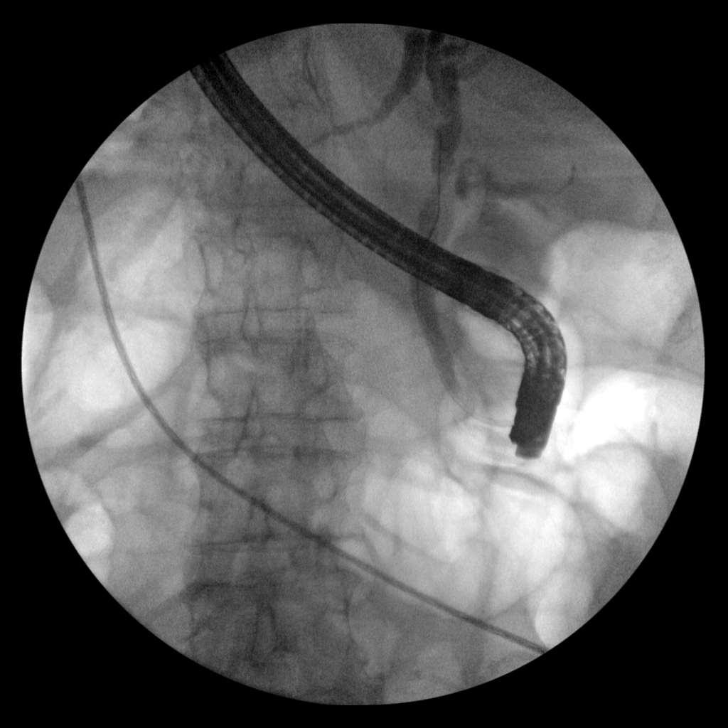
[im 6/9]
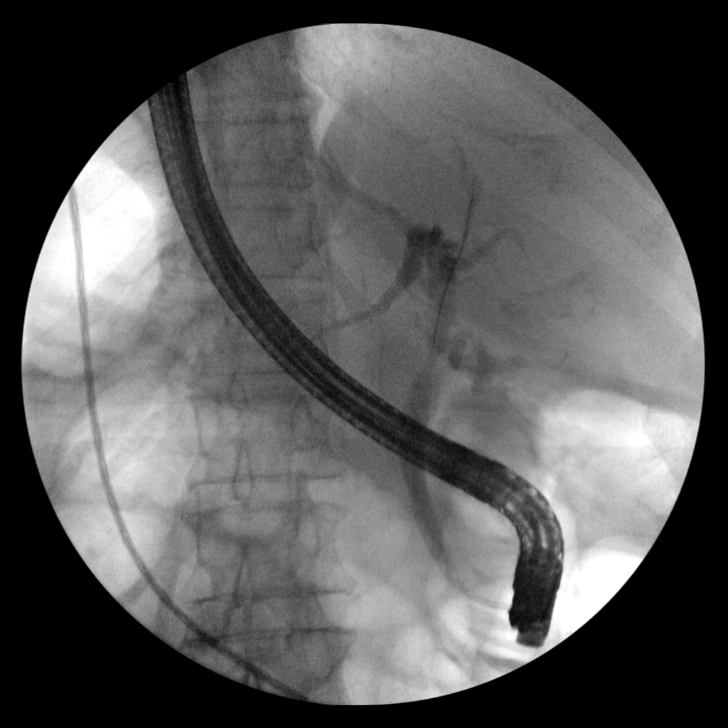
[im 7/9]
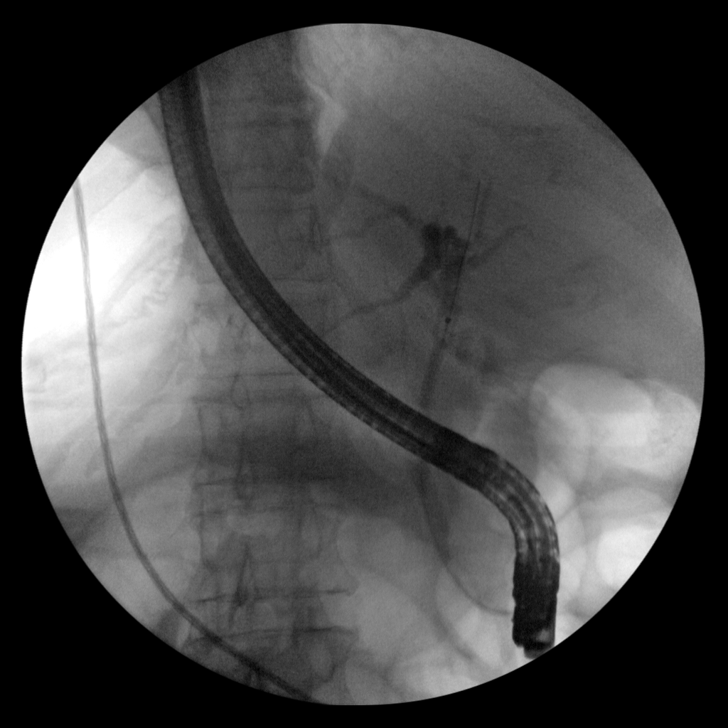
[im 8/9]
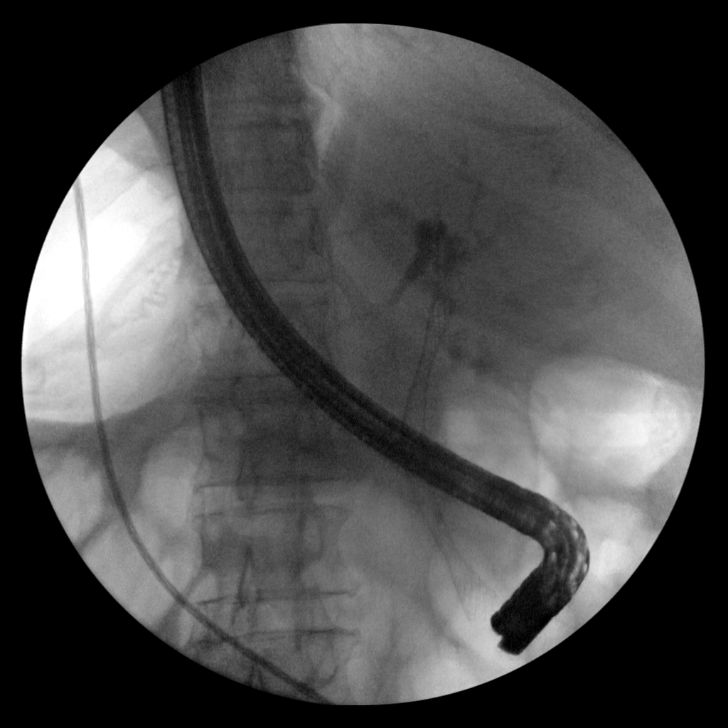
[im 9/9]
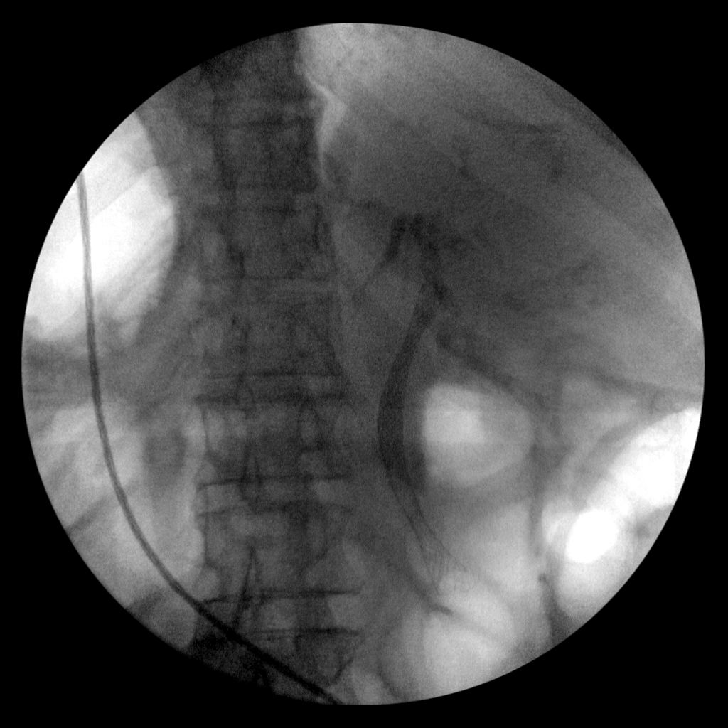

[9 of 9 positions shown; findings below may reference images not displayed]

FINDINGS: There is narrowing of the common hepatic duct in the porta hepatis.
A biliary stent has been placed across the high common hepatic duct
stricture hand across the ampulla.
IMPRESSION: Successful biliary stent placement for upper common hepatic duct
stricture.

These images were submitted for radiologic interpretation only.
Please see the procedural report for the amount of contrast and the
fluoroscopy time utilized.

## 2018-01-04 IMAGING — CR DG CHEST 1V PORT
1 series · 1 of 1 positions shown · non-contrast
Comparison: PA and lateral chest 06/17/2005.  CT chest 05/27/2016.

CLINICAL DATA: Jaundice. Increasing weakness and decreased p.o.
intake over the past week.

EXAM:
PORTABLE CHEST 1 VIEW

[portable]
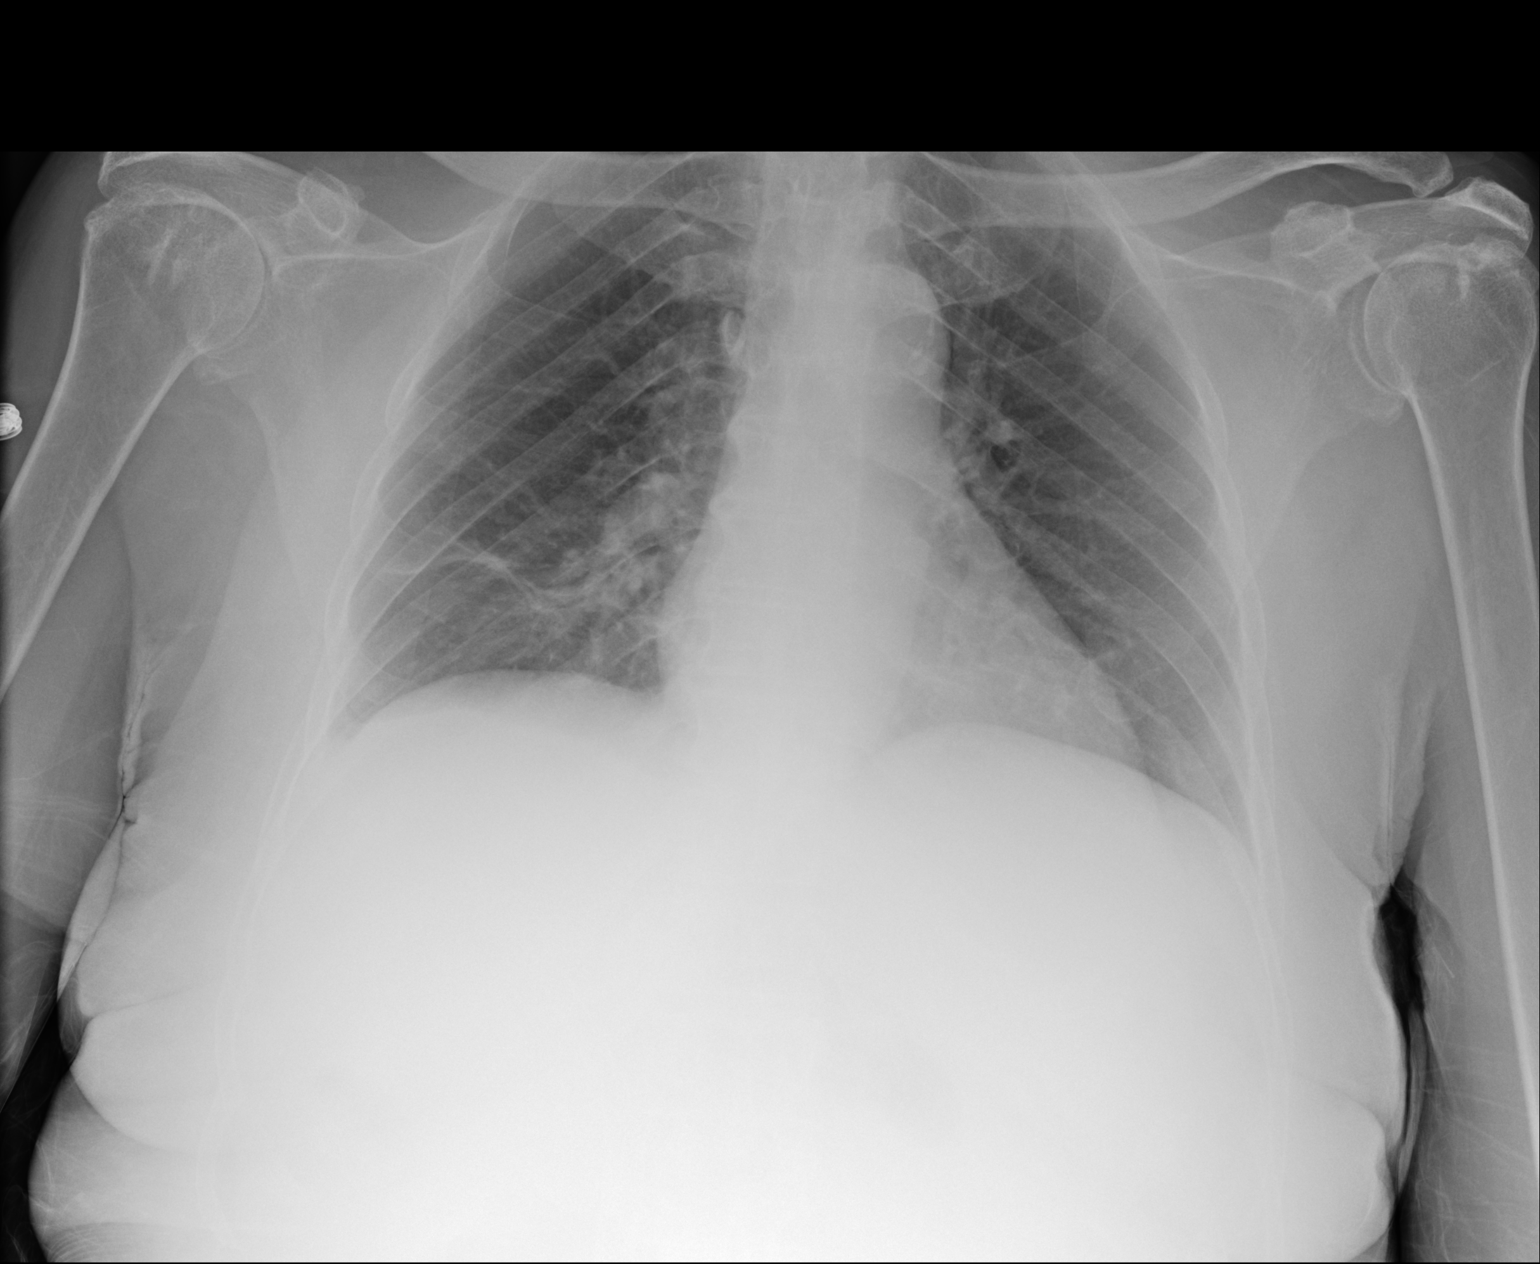

[1 of 1 positions shown; findings below may reference images not displayed]

FINDINGS: Small focus of atelectasis is present in the right lower lobe. Lungs
are otherwise clear. Heart size is normal. No pneumothorax or
pleural effusion. Aortic atherosclerosis is noted.
IMPRESSION: No acute disease. Small focus of subsegmental atelectasis right
lower lobe noted.

Atherosclerosis.
